# Patient Record
Sex: Male | Born: 1945 | Race: White | Hispanic: No | Marital: Single
Health system: Southern US, Community
[De-identification: ages and names within clinical notes are randomized; demographics above are authoritative.]

## PROBLEM LIST (undated history)

## (undated) DIAGNOSIS — N183 Chronic kidney disease, stage 3 unspecified: Secondary | ICD-10-CM

## (undated) DIAGNOSIS — M2550 Pain in unspecified joint: Secondary | ICD-10-CM

## (undated) DIAGNOSIS — Z8719 Personal history of other diseases of the digestive system: Secondary | ICD-10-CM

## (undated) DIAGNOSIS — I4891 Unspecified atrial fibrillation: Secondary | ICD-10-CM

## (undated) DIAGNOSIS — K219 Gastro-esophageal reflux disease without esophagitis: Secondary | ICD-10-CM

## (undated) DIAGNOSIS — I2510 Atherosclerotic heart disease of native coronary artery without angina pectoris: Secondary | ICD-10-CM

## (undated) DIAGNOSIS — I351 Nonrheumatic aortic (valve) insufficiency: Secondary | ICD-10-CM

## (undated) DIAGNOSIS — I5020 Unspecified systolic (congestive) heart failure: Secondary | ICD-10-CM

## (undated) DIAGNOSIS — I061 Rheumatic aortic insufficiency: Secondary | ICD-10-CM

## (undated) DIAGNOSIS — I10 Essential (primary) hypertension: Secondary | ICD-10-CM

## (undated) DIAGNOSIS — I50812 Chronic right heart failure: Secondary | ICD-10-CM

## (undated) DIAGNOSIS — Z87898 Personal history of other specified conditions: Secondary | ICD-10-CM

## (undated) DIAGNOSIS — E119 Type 2 diabetes mellitus without complications: Secondary | ICD-10-CM

## (undated) DIAGNOSIS — I255 Ischemic cardiomyopathy: Secondary | ICD-10-CM

## (undated) DIAGNOSIS — Z9289 Personal history of other medical treatment: Secondary | ICD-10-CM

## (undated) DIAGNOSIS — I482 Chronic atrial fibrillation, unspecified: Secondary | ICD-10-CM

## (undated) DIAGNOSIS — I2781 Cor pulmonale (chronic): Secondary | ICD-10-CM

## (undated) DIAGNOSIS — K921 Melena: Secondary | ICD-10-CM

## (undated) DIAGNOSIS — I509 Heart failure, unspecified: Secondary | ICD-10-CM

## (undated) HISTORY — PX: TONSILLECTOMY: SUR1361

## (undated) HISTORY — DX: Personal history of other medical treatment: Z92.89

---

## 2004-09-11 HISTORY — PX: CARDIAC CATHETERIZATION: SHX172

## 2005-08-18 ENCOUNTER — Ambulatory Visit (HOSPITAL_COMMUNITY)
Admission: RE | Admit: 2005-08-18 | Discharge: 2005-08-18 | Payer: Self-pay | Admitting: Thoracic Surgery (Cardiothoracic Vascular Surgery)

## 2005-08-21 ENCOUNTER — Encounter
Admission: RE | Admit: 2005-08-21 | Discharge: 2005-08-21 | Payer: Self-pay | Admitting: Thoracic Surgery (Cardiothoracic Vascular Surgery)

## 2005-09-06 ENCOUNTER — Ambulatory Visit (HOSPITAL_COMMUNITY): Admission: RE | Admit: 2005-09-06 | Discharge: 2005-09-06 | Payer: Self-pay | Admitting: *Deleted

## 2006-03-19 ENCOUNTER — Encounter
Admission: RE | Admit: 2006-03-19 | Discharge: 2006-03-19 | Payer: Self-pay | Admitting: Thoracic Surgery (Cardiothoracic Vascular Surgery)

## 2011-07-28 ENCOUNTER — Emergency Department (HOSPITAL_COMMUNITY)
Admission: EM | Admit: 2011-07-28 | Discharge: 2011-07-28 | Disposition: A | Discharge status: Home / Self Care | Payer: Medicare Other | Attending: Emergency Medicine | Admitting: Emergency Medicine

## 2011-07-28 ENCOUNTER — Encounter: Payer: Self-pay | Admitting: *Deleted

## 2011-07-28 DIAGNOSIS — I10 Essential (primary) hypertension: Secondary | ICD-10-CM | POA: Insufficient documentation

## 2011-07-28 DIAGNOSIS — E119 Type 2 diabetes mellitus without complications: Secondary | ICD-10-CM | POA: Insufficient documentation

## 2011-07-28 DIAGNOSIS — Z79899 Other long term (current) drug therapy: Secondary | ICD-10-CM | POA: Insufficient documentation

## 2011-07-28 DIAGNOSIS — M79606 Pain in leg, unspecified: Secondary | ICD-10-CM

## 2011-07-28 DIAGNOSIS — I4891 Unspecified atrial fibrillation: Secondary | ICD-10-CM | POA: Insufficient documentation

## 2011-07-28 DIAGNOSIS — M79609 Pain in unspecified limb: Secondary | ICD-10-CM | POA: Insufficient documentation

## 2011-07-28 DIAGNOSIS — I480 Paroxysmal atrial fibrillation: Secondary | ICD-10-CM | POA: Insufficient documentation

## 2011-07-28 DIAGNOSIS — R269 Unspecified abnormalities of gait and mobility: Secondary | ICD-10-CM | POA: Insufficient documentation

## 2011-07-28 HISTORY — DX: Unspecified atrial fibrillation: I48.91

## 2011-07-28 HISTORY — DX: Essential (primary) hypertension: I10

## 2011-07-28 MED ORDER — OXYCODONE-ACETAMINOPHEN 5-325 MG PO TABS
ORAL_TABLET | ORAL | Status: AC
  Filled 2011-07-28: qty 2

## 2011-07-28 MED ORDER — OXYCODONE-ACETAMINOPHEN 5-325 MG PO TABS
2.0000 | ORAL_TABLET | Freq: Once | ORAL | Status: AC
  Administered 2011-07-28: 2 via ORAL

## 2011-07-28 MED ORDER — HYDROMORPHONE HCL PF 1 MG/ML IJ SOLN
1.0000 mg | Freq: Once | INTRAMUSCULAR | Status: AC
  Administered 2011-07-28: 1 mg via INTRAMUSCULAR

## 2011-07-28 MED ORDER — HYDROMORPHONE HCL PF 1 MG/ML IJ SOLN
INTRAMUSCULAR | Status: AC
  Filled 2011-07-28: qty 1

## 2011-07-28 MED ORDER — OXYCODONE-ACETAMINOPHEN 5-325 MG PO TABS: 1.0000 | ORAL_TABLET | Freq: Four times a day (QID) | ORAL | Status: AC | PRN

## 2011-07-28 NOTE — ED Provider Notes (Signed)
History     CSN: [id number] Arrival date & time: 07/28/2011  5:56 AM   First MD Initiated Contact with Patient 07/28/11 [phone]      Chief Complaint  Patient presents with  . Leg Pain    (Consider location/radiation/quality/duration/timing/severity/associated sxs/prior treatment) Patient is a 65 y.o. male presenting with leg pain. The history is provided by the patient.  Leg Pain  Pertinent negatives include no numbness.   patient reports bilateral leg pain that has been fairly constant since a minor fall that occurred in October of this year. He has been seen and evaluated by his regular doctor for this complaint, and has been taking tramadol for pain. The pain has been getting worse in the last week and the patient ran out of his tramadol yesterday he comes in today with severe pain. There was no new injury. The pain is described as being in the entire leg bilaterally, worse to bilateral knees. It is constant, gradually worsening, aching, and severe. The patient reports an inability to stand secondary to severe pain. There is no associated numbness or weakness or tingling. Pain is worse with weight-bearing and any movement of the legs; it has been temporarily relieved in the past with medications.  Past Medical History  Diagnosis Date  . Atrial fibrillation   . Diabetes mellitus   . Hypertension   . Asthma   . Cancer     No past surgical history on file.  No family history on file.  History  Substance Use Topics  . Smoking status: Not on file  . Smokeless tobacco: Not on file  . Alcohol Use:       Review of Systems  Constitutional: Negative for chills, fatigue and unexpected weight change.  HENT: Negative for ear pain, nosebleeds, neck pain, neck stiffness and tinnitus.   Eyes: Negative for pain and visual disturbance.  Respiratory: Negative for cough, chest tightness and shortness of breath.   Cardiovascular: Negative for chest pain, palpitations and leg swelling.    Gastrointestinal: Negative for nausea, vomiting and abdominal pain.  Genitourinary: Negative for hematuria and flank pain.  Musculoskeletal: Positive for gait problem. Negative for back pain and joint swelling.       Positive bilateral leg pain.  Neurological: Negative for dizziness, weakness, light-headedness, numbness and headaches.  Hematological: Does not bruise/bleed easily.  Psychiatric/Behavioral: Negative for behavioral problems and confusion.    Allergies  Review of patient's allergies indicates no known allergies.  Home Medications   Current Outpatient Rx  Name Route Sig Dispense Refill  . ASPIRIN EC 81 MG PO TBEC Oral Take 81 mg by mouth daily.      Marland Kitchen CARVEDILOL 12.5 MG PO TABS Oral Take 12.5 mg by mouth 2 (two) times daily with a meal.      . LISINOPRIL 10 MG PO TABS Oral Take 10 mg by mouth daily.      Marland Kitchen PRAVASTATIN SODIUM 20 MG PO TABS Oral Take 20 mg by mouth at bedtime.      . SPIRONOLACTONE 25 MG PO TABS Oral Take 25 mg by mouth daily.      . TORSEMIDE 100 MG PO TABS Oral Take 50 mg by mouth daily.        BP 107/61  Pulse 64  Temp(Src) 98.2 F (36.8 C) (Oral)  Resp 16  SpO2 98%  Physical Exam  Constitutional: He is oriented to person, place, and time. He appears well-developed and well-nourished.       Uncomfortable appearing  HENT:  Head: Normocephalic and atraumatic.  Eyes: Pupils are equal, round, and reactive to light.  Neck: Normal range of motion. Neck supple.  Cardiovascular: Normal rate and intact distal pulses.        Irregularly irregular rhythm  Pulmonary/Chest: Effort normal and breath sounds normal.  Abdominal: Soft. He exhibits no distension. There is no tenderness.  Musculoskeletal: Normal range of motion. He exhibits no edema.       There is mild tenderness to firm palpation of the leg from the mid thigh region to the ankle bilaterally. There are blastic in the braces in place to bilateral knees; they appear to be placed too tightly. There  is no specific area of bony tenderness. There is no specific tenderness to the medial joint line, lateral joint line, or over the patella to either knee. Bilateral Lachman's with firm endpoint. No increased pain with varus or valgus maneuvers of the knees. There is 4/5 strength to muscle testing with hip flexion extension abduction adduction, knee flexion extension. There is 5 out of 5 strength with bilateral plantar flexion and dorsi flexion.  Neurological: He is alert and oriented to person, place, and time. He has normal reflexes. No cranial nerve deficit.  Skin: Skin is warm and dry. No rash noted.       No ecchymosis or obvious wounds  Psychiatric: His behavior is normal.    ED Course  Procedures (including critical care time)  Labs Reviewed - No data to display No results found.   1. Leg pain       MDM  The patient presents with worsening of bilateral leg pain that has been occurring for greater than one month. He has been seen and evaluated by his primary doctor but ran out of his tramadol yesterday. There is a refill waiting for him at the pharmacy but he was in too much pain to leave the house and drive himself there to pick it up. In the department, the patient's pain has been reduced to a manageable level with 1 mg of IM Dilaudid. He did and laid in the department with assistance from nursing staff; he was a bit unsteady on his feet but this is not unusual for the patient-he typically walks with a cane. He will be discharged home with a prescription for pain medication and advised to follow up with his regular doctor.        Elwyn Reach [place], [place] 07/28/11 1014

## 2011-07-28 NOTE — ED Notes (Signed)
Pt states he has had increased leg pain since he fell back in October. States pain was so bad that he needed to come to the ER for evalauation. Pt reports running out of his tramadol and needs more. Pt uses a cane to ambulate.

## 2011-07-28 NOTE — ED Notes (Signed)
ZOX:WR60<AV> Expected date:07/28/11<BR> Expected time: 5:22 AM<BR> Means of arrival:Ambulance<BR> Comments:<BR> Leg pain, limited weight bearing

## 2011-07-28 NOTE — ED Notes (Signed)
Pt c/o ble pain, increasing pain since falling in October, pt ran out of his Tramadol and needs a refill. Pt able to MAE w/o difficulty, increase pain w/movement

## 2011-07-28 NOTE — ED Notes (Signed)
Pt upset at this time, pt is cursing and yelling about his sister and losing his belongings, pt is unable to locate his sister, has called multiple times for a ride home. Pt also upset d/t leaving his wallet and glasses on EMS truck. Pt has a hx of AF, pt's HR is currently ranging b/w 124-140 on bedside monitor. Pt denies any chest pain or sob at this time, sts "no my heart does this when I get upset."

## 2011-07-31 NOTE — ED Provider Notes (Signed)
Medical screening examination/treatment/procedure(s) were performed by non-physician practitioner and as supervising physician I was immediately available for consultation/collaboration.   Hanley Seamen, MD 07/31/11 [phone]

## 2011-11-03 ENCOUNTER — Other Ambulatory Visit: Payer: Self-pay | Admitting: Gastroenterology

## 2011-11-03 ENCOUNTER — Encounter (HOSPITAL_COMMUNITY)
Admission: RE | Disposition: A | Discharge status: Home Health | Payer: Self-pay | Source: Ambulatory Visit | Attending: Gastroenterology

## 2011-11-03 ENCOUNTER — Ambulatory Visit (HOSPITAL_COMMUNITY)
Admission: RE | Admit: 2011-11-03 | Discharge: 2011-11-03 | Disposition: A | Discharge status: Home Health | Payer: Medicare Other | Source: Ambulatory Visit | Attending: Gastroenterology | Admitting: Gastroenterology

## 2011-11-03 ENCOUNTER — Encounter (HOSPITAL_COMMUNITY): Payer: Self-pay | Admitting: *Deleted

## 2011-11-03 DIAGNOSIS — D509 Iron deficiency anemia, unspecified: Secondary | ICD-10-CM | POA: Insufficient documentation

## 2011-11-03 DIAGNOSIS — K449 Diaphragmatic hernia without obstruction or gangrene: Secondary | ICD-10-CM | POA: Insufficient documentation

## 2011-11-03 DIAGNOSIS — I129 Hypertensive chronic kidney disease with stage 1 through stage 4 chronic kidney disease, or unspecified chronic kidney disease: Secondary | ICD-10-CM | POA: Insufficient documentation

## 2011-11-03 DIAGNOSIS — K648 Other hemorrhoids: Secondary | ICD-10-CM | POA: Insufficient documentation

## 2011-11-03 DIAGNOSIS — R634 Abnormal weight loss: Secondary | ICD-10-CM | POA: Insufficient documentation

## 2011-11-03 DIAGNOSIS — K2970 Gastritis, unspecified, without bleeding: Secondary | ICD-10-CM | POA: Insufficient documentation

## 2011-11-03 DIAGNOSIS — I2510 Atherosclerotic heart disease of native coronary artery without angina pectoris: Secondary | ICD-10-CM | POA: Insufficient documentation

## 2011-11-03 DIAGNOSIS — K644 Residual hemorrhoidal skin tags: Secondary | ICD-10-CM | POA: Insufficient documentation

## 2011-11-03 DIAGNOSIS — E119 Type 2 diabetes mellitus without complications: Secondary | ICD-10-CM | POA: Insufficient documentation

## 2011-11-03 DIAGNOSIS — D371 Neoplasm of uncertain behavior of stomach: Secondary | ICD-10-CM | POA: Insufficient documentation

## 2011-11-03 DIAGNOSIS — N183 Chronic kidney disease, stage 3 unspecified: Secondary | ICD-10-CM | POA: Insufficient documentation

## 2011-11-03 DIAGNOSIS — R630 Anorexia: Secondary | ICD-10-CM | POA: Insufficient documentation

## 2011-11-03 DIAGNOSIS — Z79899 Other long term (current) drug therapy: Secondary | ICD-10-CM | POA: Insufficient documentation

## 2011-11-03 HISTORY — DX: Rheumatic aortic insufficiency: I06.1

## 2011-11-03 HISTORY — DX: Chronic kidney disease, stage 3 unspecified: N18.30

## 2011-11-03 HISTORY — DX: Pain in unspecified joint: M25.50

## 2011-11-03 HISTORY — DX: Atherosclerotic heart disease of native coronary artery without angina pectoris: I25.10

## 2011-11-03 HISTORY — DX: Chronic kidney disease, stage 3 (moderate): N18.3

## 2011-11-03 HISTORY — PX: COLONOSCOPY: SHX5424

## 2011-11-03 HISTORY — PX: ESOPHAGOGASTRODUODENOSCOPY: SHX5428

## 2011-11-03 HISTORY — DX: Heart failure, unspecified: I50.9

## 2011-11-03 SURGERY — EGD (ESOPHAGOGASTRODUODENOSCOPY)
Anesthesia: Moderate Sedation

## 2011-11-03 MED ORDER — FENTANYL CITRATE 0.05 MG/ML IJ SOLN
INTRAMUSCULAR | Status: AC
  Filled 2011-11-03: qty 4

## 2011-11-03 MED ORDER — MIDAZOLAM HCL 10 MG/2ML IJ SOLN
INTRAMUSCULAR | Status: DC | PRN
  Administered 2011-11-03 (×3): 1 mg via INTRAVENOUS
  Administered 2011-11-03 (×2): 2 mg via INTRAVENOUS
  Administered 2011-11-03: 1 mg via INTRAVENOUS

## 2011-11-03 MED ORDER — FENTANYL NICU IV SYRINGE 50 MCG/ML
INJECTION | INTRAMUSCULAR | Status: DC | PRN
  Administered 2011-11-03 (×2): 12.5 ug via INTRAVENOUS
  Administered 2011-11-03: 25 ug via INTRAVENOUS
  Administered 2011-11-03 (×2): 12.5 ug via INTRAVENOUS

## 2011-11-03 MED ORDER — DIPHENHYDRAMINE HCL 50 MG/ML IJ SOLN
INTRAMUSCULAR | Status: AC
  Filled 2011-11-03: qty 1

## 2011-11-03 MED ORDER — MIDAZOLAM HCL 10 MG/2ML IJ SOLN
INTRAMUSCULAR | Status: AC
  Filled 2011-11-03: qty 4

## 2011-11-03 NOTE — Interval H&P Note (Signed)
History and Physical Interval Note:  11/03/2011 8:47 AM  Steve Mccarty  has presented today for surgery, with the diagnosis of IDA  The various methods of treatment have been discussed with the patient and family. After consideration of risks, benefits and other options for treatment, the patient has consented to  Procedure(s) (LRB): ESOPHAGOGASTRODUODENOSCOPY (EGD) (N/A) COLONOSCOPY (N/A) as a surgical intervention .  The patients' history has been reviewed, patient examined, no change in status, stable for surgery.  I have reviewed the patients' chart and labs.  Questions were answered to the patient's satisfaction.     [name]

## 2011-11-03 NOTE — H&P (Signed)
  Reason for Consult: Weight loss Referring Physician: Thayer Headings, M.D.  Alver Sorrow HPI: Since the early part of the year the patient reports losing 60 lbs.  He was previously 230 lbs and now he is at 170 lbs.  With routine work up he was noted to have an anemia in the 9 range.  He denies any issues with hematochezia, melena, diarrhea, constipation, or abdominal pain.  The patient denies having a prior colonoscopy in the past.  There is no known family history of colon cancer.  Overall he reports having a lower appetite.  Past Medical History  Diagnosis Date  . Atrial fibrillation   . Diabetes mellitus   . Hypertension   . Atrial fibrillation   . Coronary artery disease   . CHF (congestive heart failure)     perpheral edema  . Chronic renal disease, stage III   . Joint pain   . Aortic valve insufficiency, rheumatic     Past Surgical History  Procedure Date  . Tonsillectomy   . Cardiac catheterization     History reviewed. No pertinent family history.  Social History:  does not have a smoking history on file. He does not have any smokeless tobacco history on file. He reports that he drinks alcohol. He reports that he does not use illicit drugs.  Allergies: No Known Allergies  Medications: Scheduled:   Continuous:    No results found for this or any previous visit (from the past 24 hour(s)).   No results found.  ROS:  As stated above in the HPI otherwise negative.  Blood pressure 95/61, pulse 106, temperature 97.8 F (36.6 C), temperature source Oral, resp. rate 17, height 5' 7.5" (1.715 m), weight 81.647 kg (180 lb), SpO2 95.00%.    PE: Gen: NAD, Alert and Oriented HEENT:  /AT, EOMI Neck: Supple, no LAD Lungs: CTA Bilaterally CV: RRR without M/G/R ABM: Soft, NTND, +BS Ext: No C/C/E  Assessment/Plan: 1) Heme positive stool 2) Iron deficiency anemia 3) Abnormal weight loss 4) Anorexia  Plan: 1) EGD/Colonoscopy for further  evaluation.   Jakaylah Schlafer D 11/03/2011, 8:41 AM

## 2011-11-03 NOTE — Op Note (Signed)
[facility] [address] [place], [place]  [address]  OPERATIVE PROCEDURE REPORT  PATIENT:  Steve Mccarty, Steve Mccarty  MR#:  [id number] BIRTHDATE:  06-Jun-1946  GENDER:  male ENDOSCOPIST:  Jeani Hawking, MD ASSISTANT:  Ara Kussmaul, Technician PROCEDURE DATE:  11/03/2011 PROCEDURE:  Colonoscopy with snare polypectomy ASA CLASS:  Class III INDICATIONS:  Anemia and Weight loss MEDICATIONS:  Fentanyl 25 mcg IV, Versed 3 mg IV  DESCRIPTION OF PROCEDURE:   After the risks benefits and alternatives of the procedure were thoroughly explained, informed consent was obtained.  Digital rectal exam was performed and revealed no abnormalities.   The Pentax Ped Colon [id number] endoscope was introduced through the anus and advanced to the cecum, which was identified by both the appendix and ileocecal valve, without limitations.  The quality of the prep was excellent..  The instrument was then slowly withdrawn as the colon was fully examined. <<PROCEDUREIMAGES>> FINDINGS:  A 2 mm sessile rectal polyp was removed with a cold snare. Scattered diverticula were noted in the left side of the colon. No other abnormalities identified.   Retroflexed views in the rectum revealed internal and external hemorrhoids.    The scope was then withdrawn from the patient and the procedure terminated.  COMPLICATIONS:  None  IMPRESSION:  1) Sessile polyp 2) Internal and external hemorrhoids RECOMMENDATIONS:  1) Await biopsy results. 2) Repeat colonoscopy in 5-10 years. 3) Start on a PPI. 4) Follow up in the office in 3 months.  ______________________________ Jeani Hawking, MD  CC:  Thayer Headings, M.D.  n. eSIGNED:   Jeani Hawking at 11/03/2011 09:26 AM  Theora Master, [phone]

## 2011-11-03 NOTE — Op Note (Signed)
[facility] [address] [place], [place]  [address]  OPERATIVE PROCEDURE REPORT  PATIENT:  Steve Mccarty, Steve Mccarty  MR#:  [id number] BIRTHDATE:  May 26, 1946  GENDER:  male ENDOSCOPIST:  Jeani Hawking, MD ASSISTANT:  Ara Kussmaul, Technician PROCEDURE DATE:  11/03/2011 PROCEDURE:  EGD with biopsy, [address] ASA CLASS:  Class III INDICATIONS:  Anemia and Weight loss MEDICATIONS:  Fentanyl 50 mcg IV, Versed 6 mg IV  DESCRIPTION OF PROCEDURE:   After the risks benefits and alternatives of the procedure were thoroughly explained, informed consent was obtained.  The Pentax Gastroscope [id number] endoscope was introduced through the mouth and advanced to the second portion of the duodenum, without limitations.  The instrument was slowly withdrawn as the mucosa was fully examined. <<PROCEDUREIMAGES>>  FINDINGS:  Gastritis was identified in the antum and multiple cold biopsies were obtained. A few erosions were found in this area. A small hiatal hernia was identified. No other abnormalities noted. Retroflexed views revealed no abnormalities.    The scope was then withdrawn from the patient and the procedure terminated.  COMPLICATIONS:  None  IMPRESSION:  1) Moderate gastritis 2) Small hiatal hernia RECOMMENDATIONS:  1) Await biopsy results 2) Proceed with the colonoscopy.  ______________________________ Jeani Hawking, MD  CC:  Thayer Headings, M.D.  n. eSIGNED:   Jeani Hawking at 11/03/2011 09:04 AM  Theora Master, [phone]

## 2011-11-03 NOTE — Discharge Instructions (Addendum)
Endoscopy Care After Please read the instructions outlined below and refer to this sheet in the next few weeks. These discharge instructions provide you with general information on caring for yourself after you leave the hospital. Your doctor may also give you specific instructions. While your treatment has been planned according to the most current medical practices available, unavoidable complications occasionally occur. If you have any problems or questions after discharge, please call your doctor. HOME CARE INSTRUCTIONS Activity  You may resume your regular activity but move at a slower pace for the next 24 hours.   Take frequent rest periods for the next 24 hours.   Walking will help expel (get rid of) the air and reduce the bloated feeling in your abdomen.   No driving for 24 hours (because of the anesthesia (medicine) used during the test).   You may shower.   Do not sign any important legal documents or operate any machinery for 24 hours (because of the anesthesia used during the test).  Nutrition  Drink plenty of fluids.   You may resume your normal diet.   Begin with a light meal and progress to your normal diet.   Avoid alcoholic beverages for 24 hours or as instructed by your caregiver.  Medications You may resume your normal medications unless your caregiver tells you otherwise. What you can expect today  You may experience abdominal discomfort such as a feeling of fullness or "gas" pains.   You may experience a sore throat for 2 to 3 days. This is normal. Gargling with salt water may help this.  Follow-up Your doctor will discuss the results of your test with you. SEEK IMMEDIATE MEDICAL CARE IF:  You have excessive nausea (feeling sick to your stomach) and/or vomiting.   You have severe abdominal pain and distention (swelling).   You have trouble swallowing.   You have a temperature over 100 F (37.8 C).   You have rectal bleeding or vomiting of blood.    Document Released: 04/11/2004 Document Revised: 05/10/2011 Document Reviewed: 10/23/2007 [facility] Patient Information 2012 [place], [place].  Colonoscopy Care After These instructions give you information on caring for yourself after your procedure. Your doctor may also give you more specific instructions. Call your doctor if you have any problems or questions after your procedure. HOME CARE  Take it easy for the next 24 hours.   Rest.   Walk or use warm packs on your belly (abdomen) if you have belly cramping or gas.   Do not drive for 24 hours.   You may shower.   Do not sign important papers or use machinery for 24 hours.   Drink enough fluids to keep your pee (urine) clear or pale yellow.   Resume your normal diet. Avoid heavy or fried foods.   Avoid alcohol.   Continue taking your normal medicines.   Only take medicine as told by your doctor. Do not take aspirin.  If you had growths (polyps) removed:  Do not take aspirin.   Do not drink alcohol for 7 days or as told by your doctor.   Eat a soft diet for 24 hours.  GET HELP RIGHT AWAY IF:  You have a fever.   You pass clumps of tissue (blood clots) or fill the toilet with blood.   You have belly pain that gets worse and medicine does not help.   Your belly is puffy (swollen).   You feel sick to your stomach (nauseous) or throw up (vomit).  MAKE  SURE YOU:  Understand these instructions.   Will watch your condition.   Will get help right away if you are not doing well or get worse.  Document Released: 09/30/2010 Document Revised: 05/10/2011 Document Reviewed: 09/30/2010 [facility] Patient Information 2012 [place], [place].

## 2011-11-06 ENCOUNTER — Encounter (HOSPITAL_COMMUNITY): Payer: Self-pay | Admitting: Gastroenterology

## 2011-11-28 DIAGNOSIS — Z9289 Personal history of other medical treatment: Secondary | ICD-10-CM

## 2011-11-28 HISTORY — DX: Personal history of other medical treatment: Z92.89

## 2012-02-28 ENCOUNTER — Other Ambulatory Visit: Payer: Self-pay | Admitting: Internal Medicine

## 2012-02-28 DIAGNOSIS — R188 Other ascites: Secondary | ICD-10-CM

## 2012-02-29 ENCOUNTER — Ambulatory Visit
Admission: RE | Admit: 2012-02-29 | Discharge: 2012-02-29 | Disposition: A | Discharge status: Home / Self Care | Payer: Medicare Other | Source: Ambulatory Visit | Attending: Internal Medicine | Admitting: Internal Medicine

## 2012-02-29 DIAGNOSIS — R188 Other ascites: Secondary | ICD-10-CM

## 2012-03-20 ENCOUNTER — Emergency Department (HOSPITAL_COMMUNITY): Payer: Medicare Other

## 2012-03-20 ENCOUNTER — Encounter (HOSPITAL_COMMUNITY): Payer: Self-pay | Admitting: *Deleted

## 2012-03-20 ENCOUNTER — Inpatient Hospital Stay (HOSPITAL_COMMUNITY)
Admission: EM | Admit: 2012-03-20 | Discharge: 2012-03-22 | DRG: 378 | Disposition: A | Discharge status: Home / Self Care | Payer: Medicare Other | Attending: Internal Medicine | Admitting: Internal Medicine

## 2012-03-20 DIAGNOSIS — K5731 Diverticulosis of large intestine without perforation or abscess with bleeding: Principal | ICD-10-CM | POA: Diagnosis present

## 2012-03-20 DIAGNOSIS — I4891 Unspecified atrial fibrillation: Secondary | ICD-10-CM

## 2012-03-20 DIAGNOSIS — I129 Hypertensive chronic kidney disease with stage 1 through stage 4 chronic kidney disease, or unspecified chronic kidney disease: Secondary | ICD-10-CM | POA: Diagnosis present

## 2012-03-20 DIAGNOSIS — I959 Hypotension, unspecified: Secondary | ICD-10-CM | POA: Diagnosis present

## 2012-03-20 DIAGNOSIS — K922 Gastrointestinal hemorrhage, unspecified: Secondary | ICD-10-CM | POA: Diagnosis present

## 2012-03-20 DIAGNOSIS — N183 Chronic kidney disease, stage 3 unspecified: Secondary | ICD-10-CM | POA: Diagnosis present

## 2012-03-20 DIAGNOSIS — E871 Hypo-osmolality and hyponatremia: Secondary | ICD-10-CM | POA: Diagnosis present

## 2012-03-20 DIAGNOSIS — D62 Acute posthemorrhagic anemia: Secondary | ICD-10-CM | POA: Diagnosis present

## 2012-03-20 DIAGNOSIS — N179 Acute kidney failure, unspecified: Secondary | ICD-10-CM | POA: Diagnosis present

## 2012-03-20 DIAGNOSIS — E118 Type 2 diabetes mellitus with unspecified complications: Secondary | ICD-10-CM | POA: Diagnosis present

## 2012-03-20 DIAGNOSIS — D72829 Elevated white blood cell count, unspecified: Secondary | ICD-10-CM

## 2012-03-20 LAB — COMPREHENSIVE METABOLIC PANEL
CO2: 20 mEq/L (ref 19–32)
Calcium: 10 mg/dL (ref 8.4–10.5)
Creatinine, Ser: 2.9 mg/dL — ABNORMAL HIGH (ref 0.50–1.35)
GFR calc Af Amer: 24 mL/min — ABNORMAL LOW (ref >90)
GFR calc non Af Amer: 21 mL/min — ABNORMAL LOW (ref >90)
Glucose, Bld: 192 mg/dL — ABNORMAL HIGH (ref 70–99)

## 2012-03-20 LAB — CBC
Hemoglobin: 6.7 g/dL — CL (ref 13.0–17.0)
MCH: 27 pg (ref 26.0–34.0)
MCH: 27.4 pg (ref 26.0–34.0)
MCHC: 35 g/dL (ref 30.0–36.0)
MCV: 78.2 fL (ref 78.0–100.0)
MCV: 78.3 fL (ref 78.0–100.0)
Platelets: 187 10*3/uL (ref 150–400)
RBC: 2.48 MIL/uL — ABNORMAL LOW (ref 4.22–5.81)
RDW: 16.3 % — ABNORMAL HIGH (ref 11.5–15.5)

## 2012-03-20 LAB — GLUCOSE, CAPILLARY

## 2012-03-20 LAB — MAGNESIUM: Magnesium: 3.5 mg/dL — ABNORMAL HIGH (ref 1.5–2.5)

## 2012-03-20 LAB — OCCULT BLOOD, POC DEVICE: Fecal Occult Bld: POSITIVE

## 2012-03-20 LAB — PHOSPHORUS: Phosphorus: 4.1 mg/dL (ref 2.3–4.6)

## 2012-03-20 MED ORDER — ONDANSETRON HCL 4 MG/2ML IJ SOLN: 4.0000 mg | Freq: Four times a day (QID) | INTRAMUSCULAR | Status: DC | PRN

## 2012-03-20 MED ORDER — LISINOPRIL 10 MG PO TABS: 10.0000 mg | ORAL_TABLET | Freq: Every day | ORAL | Status: DC

## 2012-03-20 MED ORDER — SODIUM CHLORIDE 0.9 % IJ SOLN
3.0000 mL | Freq: Two times a day (BID) | INTRAMUSCULAR | Status: DC
  Administered 2012-03-20 – 2012-03-22 (×4): 3 mL via INTRAVENOUS

## 2012-03-20 MED ORDER — SODIUM CHLORIDE 0.9 % IV SOLN
8.0000 mg/h | INTRAVENOUS | Status: DC
  Administered 2012-03-20 – 2012-03-22 (×4): 8 mg/h via INTRAVENOUS
  Filled 2012-03-20 (×10): qty 80

## 2012-03-20 MED ORDER — SIMVASTATIN 10 MG PO TABS
10.0000 mg | ORAL_TABLET | Freq: Every day | ORAL | Status: DC
  Administered 2012-03-20 – 2012-03-21 (×2): 10 mg via ORAL
  Filled 2012-03-20 (×3): qty 1

## 2012-03-20 MED ORDER — SPIRONOLACTONE 25 MG PO TABS: 25.0000 mg | ORAL_TABLET | Freq: Every day | ORAL | Status: DC

## 2012-03-20 MED ORDER — CARVEDILOL 12.5 MG PO TABS
12.5000 mg | ORAL_TABLET | Freq: Two times a day (BID) | ORAL | Status: DC
  Filled 2012-03-20 (×6): qty 1

## 2012-03-20 MED ORDER — HYDROCODONE-ACETAMINOPHEN 5-325 MG PO TABS
1.0000 | ORAL_TABLET | ORAL | Status: DC | PRN
  Administered 2012-03-21: 2 via ORAL
  Administered 2012-03-21: 1 via ORAL
  Filled 2012-03-20: qty 2
  Filled 2012-03-20: qty 1

## 2012-03-20 MED ORDER — SODIUM CHLORIDE 0.9 % IV SOLN
80.0000 mg | Freq: Once | INTRAVENOUS | Status: AC
  Administered 2012-03-20: 80 mg via INTRAVENOUS
  Filled 2012-03-20: qty 80

## 2012-03-20 MED ORDER — INSULIN ASPART 100 UNIT/ML ~~LOC~~ SOLN: 0.0000 [IU] | Freq: Three times a day (TID) | SUBCUTANEOUS | Status: DC

## 2012-03-20 MED ORDER — ONDANSETRON HCL 4 MG PO TABS: 4.0000 mg | ORAL_TABLET | Freq: Four times a day (QID) | ORAL | Status: DC | PRN

## 2012-03-20 MED ORDER — SODIUM CHLORIDE 0.9 % IV SOLN
Freq: Once | INTRAVENOUS | Status: AC
  Administered 2012-03-20: 12:00:00 via INTRAVENOUS

## 2012-03-20 MED ORDER — TORSEMIDE 20 MG PO TABS: 50.0000 mg | ORAL_TABLET | Freq: Every day | ORAL | Status: DC

## 2012-03-20 MED ORDER — INSULIN ASPART 100 UNIT/ML ~~LOC~~ SOLN
0.0000 [IU] | Freq: Three times a day (TID) | SUBCUTANEOUS | Status: DC
  Administered 2012-03-20: 3 [IU] via SUBCUTANEOUS
  Administered 2012-03-21: 2 [IU] via SUBCUTANEOUS
  Administered 2012-03-21 – 2012-03-22 (×3): 1 [IU] via SUBCUTANEOUS

## 2012-03-20 MED ORDER — INSULIN ASPART 100 UNIT/ML ~~LOC~~ SOLN: 0.0000 [IU] | SUBCUTANEOUS | Status: DC

## 2012-03-20 NOTE — ED Provider Notes (Signed)
History     CSN: [id number]  Arrival date & time 03/20/12  1059   First MD Initiated Contact with Patient 03/20/12 1131      No chief complaint on file.   HPI The patient presents with complaints of fatigue and rectal bleeding.  He notes over the past 2 weeks his symptoms began gradually then progressed to near incapacitating fatigue and inability to perform activities of daily living.  He notes symptoms are worse with any amount of motion, minimally better at rest.  He denies similar prior episodes of bleeding. The patient recently initiated as a Xarelto do to his history of atrial fibrillation and aortic valve insufficiency. He denies any ongoing syncope, confusion, disorientation, chest pain, dyspnea, new lower extremity edema. Past Medical History  Diagnosis Date  . Atrial fibrillation   . Diabetes mellitus   . Hypertension   . Atrial fibrillation   . Coronary artery disease   . CHF (congestive heart failure)     perpheral edema  . Chronic renal disease, stage III   . Joint pain   . Aortic valve insufficiency, rheumatic     Past Surgical History  Procedure Date  . Tonsillectomy   . Cardiac catheterization   . Esophagogastroduodenoscopy 11/03/2011    Procedure: ESOPHAGOGASTRODUODENOSCOPY (EGD);  Surgeon: Theda Belfast, MD;  Location: [facility];  Service: Endoscopy;  Laterality: N/A;  . Colonoscopy 11/03/2011    Procedure: COLONOSCOPY;  Surgeon: Theda Belfast, MD;  Location: [facility];  Service: Endoscopy;  Laterality: N/A;    History reviewed. No pertinent family history.  History  Substance Use Topics  . Smoking status: Never Smoker   . Smokeless tobacco: Never Used  . Alcohol Use: Yes     rarely      Review of Systems  Constitutional:       Per HPI, otherwise negative  HENT:       Per HPI, otherwise negative  Eyes: Negative.   Respiratory:       Per HPI, otherwise negative  Cardiovascular:       Per HPI, otherwise negative  Gastrointestinal:  Negative for vomiting.  Genitourinary: Negative.   Musculoskeletal:       Per HPI, otherwise negative  Skin: Negative.   Neurological: Negative for syncope.    Allergies  Review of patient's allergies indicates no known allergies.  Home Medications   Current Outpatient Rx  Name Route Sig Dispense Refill  . ASPIRIN EC 81 MG PO TBEC Oral Take 81 mg by mouth daily.      Marland Kitchen CARVEDILOL 12.5 MG PO TABS Oral Take 12.5 mg by mouth 2 (two) times daily with a meal.      . LISINOPRIL 10 MG PO TABS Oral Take 10 mg by mouth daily.      Marland Kitchen PRAVASTATIN SODIUM 20 MG PO TABS Oral Take 20 mg by mouth at bedtime.      . SPIRONOLACTONE 25 MG PO TABS Oral Take 25 mg by mouth daily.      . TORSEMIDE 100 MG PO TABS Oral Take 50 mg by mouth daily.        BP 103/73  Pulse 110  Temp 97.1 F (36.2 C) (Rectal)  Resp 19  SpO2 100%  Physical Exam  Nursing note and vitals reviewed. Constitutional: He is oriented to person, place, and time.       Pale, elderly appearing male resting in bed  HENT:  Head: Normocephalic and atraumatic.  Eyes:  Pupils are equal round and reactive, conjunctiva isn't pale  Cardiovascular: Normal rate.   Murmur heard. Pulmonary/Chest: Effort normal. No respiratory distress.  Abdominal: Soft. He exhibits no distension.  Genitourinary: Guaiac positive stool.       Patient's stool is both grossly and Hemoccult-positive  Neurological: He is alert and oriented to person, place, and time.  Skin: Skin is dry. There is pallor.  Psychiatric: He has a normal mood and affect.    ED Course  Procedures (including critical care time)  Labs Reviewed  PROTIME-INR - Abnormal; Notable for the following:    Prothrombin Time 26.4 (*)     INR 2.38 (*)     All other components within normal limits  CBC - Abnormal; Notable for the following:    WBC 11.1 (*)     RBC 2.48 (*)     Hemoglobin 6.7 (*)     HCT 19.4 (*)     RDW 16.8 (*)     All other components within normal limits    COMPREHENSIVE METABOLIC PANEL - Abnormal; Notable for the following:    Sodium 127 (*)     Chloride 87 (*)     Glucose, Bld 192 (*)     BUN 162 (*)     Creatinine, Ser 2.90 (*)     GFR calc non Af Amer 21 (*)     GFR calc Af Amer 24 (*)     All other components within normal limits  TYPE AND SCREEN  OCCULT BLOOD, POC DEVICE  ABO/RH  PREPARE RBC (CROSSMATCH)   Dg Chest Port 1 View  03/20/2012  *RADIOLOGY REPORT*  Clinical Data: GI bleed.  PORTABLE CHEST - 1 VIEW  Comparison: PA and lateral chest 07/18/2011.  Findings: Tiny right pleural effusion is noted.  No left pleural effusion is seen.  There is cardiomegaly but no pulmonary edema. The lungs are clear.  IMPRESSION: Tiny right pleural effusion and cardiomegaly without edema.  Original Report Authenticated By: Bernadene Bell. Maricela Curet, M.D.     No diagnosis found.  Cardiac 110st, abnormal  Pulse ox 99%ra, normal   Date: 03/21/2012  Rate: 11  Rhythm: AFIB  QRS Axis: LAD  Intervals: afib  ST/T Wave abnormalities: nonspecific st / t changes  Conduction Disutrbances:NIVCD  Narrative Interpretation:   Old EKG Reviewed: Not available ABNORMAL   On return of initial labs, I spoke w GI, and transfusion was initiated  MDM  This elderly appearing M p/w weeks of fatigue and rectal bleeding.  On exam he is pale, w pale conjuctiva.  The patient is, however, in no distress.  Labs notable for anemia, and given his active bleeding, I discussed his case w GI, prepared him for transfusion, and he was admitted for further E/M.       Gerhard Munch, MD 03/21/12 0004

## 2012-03-20 NOTE — ED Notes (Signed)
ZOX:WR60<AV> Expected date:03/20/12<BR> Expected time:10:50 AM<BR> Means of arrival:Ambulance<BR> Comments:<BR> Abdominal pain with dark stool

## 2012-03-20 NOTE — ED Notes (Signed)
Per EMS pt from home, lives by self, started taking xarelto 2 weeks ago, that's when he started noticing dark tarry stools, has been trying to get in touch w/ his PCP Dr. Ronne Binning but has not ben about to until today and was told he needed to come to the ER. Pt pale. BP 90/60, HR 100, RR 18.

## 2012-03-20 NOTE — H&P (Signed)
[facility] History and Physical  Steve Mccarty WJX:[id number] DOB: 29-Jul-1946 DOA: 03/20/2012  Referring physician: ED physician PCP: No primary provider on file.   Chief Complaint: rectal bleeding  HPI:  Pt is 66 yo male who presents to [facility] with main concern of rectal bleed that he initially noted 1-2 days prior to admission and has been associated with persistent fatigue that he first noticed 2 weeks prior to admission. He explains that his symptoms were so bothersome that he was not able to carry the activities of daily living. He also experience shortness of breath especially with exertion, generalized weakness. He denies chest pain, fevers, chills, or similar episodes in the past. No recent sicknesses, no sick contacts, no traumas. He reports being on Xeralto. He denies syncopal events, no dizziness, no headaches, no specific focal neurologic weakness.   Review of Systems:   Constitutional: Negative for fever, chills. Negative for diaphoresis.  HENT: Negative for hearing loss, ear pain, nosebleeds, congestion, sore throat, neck pain, tinnitus and ear discharge.   Eyes: Negative for blurred vision, double vision, photophobia, pain, discharge and redness.  Respiratory: Negative for cough, hemoptysis, sputum production, shortness of breath, wheezing and stridor.   Cardiovascular: Negative for chest pain, palpitations, orthopnea, claudication and leg swelling.  Gastrointestinal: Negative for nausea, vomiting and abdominal pain. Negative for heartburn, constipation. Positive for blood in stool. Genitourinary: Negative for dysuria, urgency, frequency, hematuria and flank pain.  Musculoskeletal: Negative for myalgias, back pain, joint pain and falls.  Skin: Negative for itching and rash.  Neurological: Negative for dizziness and weakness. Negative for tingling, tremors, sensory change, speech change, focal weakness, loss of consciousness and headaches.  Endo/Heme/Allergies: Negative  for environmental allergies and polydipsia. Does not bruise/bleed easily.  Psychiatric/Behavioral: Negative for suicidal ideas. The patient is not nervous/anxious.      Past Medical History  Diagnosis Date  . Atrial fibrillation   . Diabetes mellitus   . Hypertension   . Atrial fibrillation   . Coronary artery disease   . CHF (congestive heart failure)     perpheral edema  . Chronic renal disease, stage III   . Joint pain   . Aortic valve insufficiency, rheumatic    Past Surgical History  Procedure Date  . Tonsillectomy   . Cardiac catheterization   . Esophagogastroduodenoscopy 11/03/2011    Procedure: ESOPHAGOGASTRODUODENOSCOPY (EGD);  Surgeon: Theda Belfast, MD;  Location: [facility];  Service: Endoscopy;  Laterality: N/A;  . Colonoscopy 11/03/2011    Procedure: COLONOSCOPY;  Surgeon: Theda Belfast, MD;  Location: [facility];  Service: Endoscopy;  Laterality: N/A;   Social History:  reports that he has never smoked. He has never used smokeless tobacco. He reports that he drinks alcohol. He reports that he does not use illicit drugs.  No Known Allergies  History reviewed. No pertinent family history.  Prior to Admission medications   Medication Sig Start Date End Date Taking? Authorizing Provider  aspirin EC 81 MG tablet Take 81 mg by mouth daily.      Historical Provider, MD  carvedilol (COREG) 12.5 MG tablet Take 12.5 mg by mouth 2 (two) times daily with a meal.      Historical Provider, MD  lisinopril (PRINIVIL,ZESTRIL) 10 MG tablet Take 10 mg by mouth daily.      Historical Provider, MD  pravastatin (PRAVACHOL) 20 MG tablet Take 20 mg by mouth at bedtime.      Historical Provider, MD  spironolactone (ALDACTONE) 25 MG tablet Take 25 mg  by mouth daily.      Historical Provider, MD  torsemide (DEMADEX) 100 MG tablet Take 50 mg by mouth daily.      Historical Provider, MD   Physical Exam: Filed Vitals:   03/20/12 1117 03/20/12 1230  BP: 102/51 103/73  Pulse: 112 110    Temp: 97.1 F (36.2 C)   TempSrc: Rectal   Resp: 17 19  SpO2: 100% 100%    Physical Exam  Constitutional: Appears tired and pale. No distress.  HENT: Normocephalic. External right and left ear normal. Oropharynx is clear and moist.  Eyes: EOM are normal. PERRLA, no scleral icterus. Conjunctival pallor.  Neck: Normal ROM. Neck supple. No JVD. No tracheal deviation. No thyromegaly.  CVS: Irregular rate and rhythm, no murmurs, no gallops, no carotid bruit.  Pulmonary: Effort and breath sounds normal, no stridor, rhonchi, wheezes, rales.  Abdominal: Soft. BS +,  no distension, tenderness, rebound or guarding.  Musculoskeletal: Normal range of motion. No edema and no tenderness.  Lymphadenopathy: No lymphadenopathy noted, cervical, inguinal. Neuro: Alert. Normal reflexes, muscle tone coordination. No cranial nerve deficit. Skin: Skin is warm and dry. No rash noted. Not diaphoretic. No erythema. Pallor.  Psychiatric: Normal mood and affect. Behavior, judgment, thought content normal.   Labs on Admission:  Basic Metabolic Panel:  Lab 03/20/12 9147  NA 127*  K 4.2  CL 87*  CO2 20  GLUCOSE 192*  BUN 162*  CREATININE 2.90*  CALCIUM 10.0  MG --  PHOS --   Liver Function Tests:  Lab 03/20/12 1200  AST 19  ALT 10  ALKPHOS 65  BILITOT 1.1  PROT 7.8  ALBUMIN 4.2   CBC:  Lab 03/20/12 1200  WBC 11.1*  NEUTROABS --  HGB 6.7*  HCT 19.4*  MCV 78.2  PLT 249   Radiological Exams on Admission: Dg Chest Port 1 View  03/20/2012  *RADIOLOGY REPORT*  Clinical Data: GI bleed.  PORTABLE CHEST - 1 VIEW  Comparison: PA and lateral chest 07/18/2011.  Findings: Tiny right pleural effusion is noted.  No left pleural effusion is seen.  There is cardiomegaly but no pulmonary edema. The lungs are clear.  IMPRESSION: Tiny right pleural effusion and cardiomegaly without edema.  Original Report Authenticated By: Bernadene Bell. D'ALESSIO, M.D.   EKG: Normal sinus rhythm, tachycardia, no ST/T wave  changes  Assessment/Plan  Principal Problem:  *GI bleed - unclear etiology at this time - GI consulted - will follow upon recommendations - CBC repeat and CBC in AM  Active Problems:  Hypotension - pt is several home BP medication, will hold all except low dose Coreg for now - will continue to monitor vitals per floor protocol and will readjust the regimen as indicated   ARF (acute renal failure) - likely of pre renal etiology - continue IVF, hold torsemide and lisinopril - BMP in AM   Diabetes mellitus type 2 with complications - will check A1C - start SSI for now and will readjust the regimen as indicated   Hyponatremia - likely of pre renal etiology - will check BMP in AM   Leukocytosis - unclear etiology - CBC in AM  Code Status: Full Family Communication: Pt at bedside Disposition Plan: Admit to telemetry floor  Debbora Presto, MD  [facility] Pager [phone]  If 7PM-7AM, please contact night-coverage [email or web address] Password [email or web address] 03/20/2012, 1:51 PM

## 2012-03-21 DIAGNOSIS — E871 Hypo-osmolality and hyponatremia: Secondary | ICD-10-CM

## 2012-03-21 LAB — BASIC METABOLIC PANEL
Calcium: 9.8 mg/dL (ref 8.4–10.5)
Creatinine, Ser: 2.26 mg/dL — ABNORMAL HIGH (ref 0.50–1.35)
GFR calc Af Amer: 33 mL/min — ABNORMAL LOW (ref >90)

## 2012-03-21 LAB — CBC
MCV: 78.5 fL (ref 78.0–100.0)
Platelets: 170 10*3/uL (ref 150–400)
RDW: 16.4 % — ABNORMAL HIGH (ref 11.5–15.5)
WBC: 9.4 10*3/uL (ref 4.0–10.5)

## 2012-03-21 LAB — GLUCOSE, CAPILLARY
Glucose-Capillary: 155 mg/dL — ABNORMAL HIGH (ref 70–99)
Glucose-Capillary: 121 mg/dL — ABNORMAL HIGH (ref 70–99)

## 2012-03-21 LAB — PREPARE RBC (CROSSMATCH)

## 2012-03-21 MED ORDER — SODIUM CHLORIDE 0.9 % IV SOLN
INTRAVENOUS | Status: DC
  Administered 2012-03-21 (×2): via INTRAVENOUS

## 2012-03-21 NOTE — Progress Notes (Signed)
Patient ID: Steve Mccarty, male   DOB: 20-Feb-1946, 66 y.o.   MRN: [id number]  [facility] PROGRESS NOTE  Steve Mccarty:[id number] DOB: 02/23/1946 DOA: 03/20/2012 PCP: No primary provider on file.  Brief narrative: Pt is 66 yo male who was admitted 03/20/2012 with main concern of progressively worsening fatigue and was found to have Hg 6.7 on admission. He is now s/p 3 units PRBC transfusion with Hg 7.9 today. FOBT positive. GI consulted. Please note that pt did reports weight loss over the past 6 months but unclear how much weight, he thinks ~ 40 lbs.  ASSESSMENT AND PLAN:  Principal Problem:  *GI bleed - still unclear source - GI consulted for further recommendations on management - pt is now s/t 3 units PRBC transfussion - will obtain CBC in AM - will keep NPO for now until seen by GI  Active Problems:  Hypotension - likely of pre renal etiology - will hold home blood pressure medications for now - continue to monitor vitals per floor protocol   ARF (acute renal failure) - likely of pre renal etiology - creatinine is trending down - BMP in AM   Diabetes mellitus type 2 with complications - well controlled - will continue to monitor CBG and will readjust the regimen as indicated   Hyponatremia - Na remains stable - BMP in AM   Leukocytosis - likely reactive - WBC is within normal limits this AM - CBC in AM  Consultants:  GI  Procedures:  None  Antibiotics:  None  HPI/Subjective: No events overnight. Pt reports feeling better this AM.  Objective: Filed Vitals:   03/21/12 0800 03/21/12 1055 03/21/12 1135 03/21/12 1235  BP: 93/57 93/63 90/57  93/57  Pulse: 84 110 108   Temp:  98.2 F (36.8 C) 98.1 F (36.7 C) 98.2 F (36.8 C)  TempSrc:  Oral Oral Oral  Resp:  18 18 20   SpO2:        Intake/Output Summary (Last 24 hours) at 03/21/12 1300 Last data filed at 03/21/12 1235  Gross per 24 hour  Intake 2577.5 ml  Output   2675 ml  Net   -97.5 ml    Exam:   General:  Pt is alert, follows commands appropriately, not in acute distress  Cardiovascular: Regular rhythm, tachycardic, S1/S2, no murmurs, no rubs, no gallops  Respiratory: Clear to auscultation bilaterally, no wheezing, no crackles, no rhonchi  Abdomen: Soft, non tender, non distended, bowel sounds present, no guarding  Extremities: No edema, pulses DP and PT palpable bilaterally  Neuro: Grossly nonfocal  Data Reviewed: Basic Metabolic Panel:  Lab 03/21/12 9562 03/20/12 1200  NA 126* 127*  K 3.6 4.2  CL 91* 87*  CO2 22 20  GLUCOSE 134* 192*  BUN 124* 162*  CREATININE 2.26* 2.90*  CALCIUM 9.8 10.0  MG -- 3.5*  PHOS -- 4.1   Liver Function Tests:  Lab 03/20/12 1200  AST 19  ALT 10  ALKPHOS 65  BILITOT 1.1  PROT 7.8  ALBUMIN 4.2   CBC:  Lab 03/21/12 0411 03/20/12 2314 03/20/12 1200  WBC 9.4 9.7 11.1*  NEUTROABS -- -- --  HGB 7.9* 8.2* 6.7*  HCT 22.6* 23.4* 19.4*  MCV 78.5 78.3 78.2  PLT 170 187 249   CBG:  Lab 03/21/12 1234 03/21/12 0703 03/20/12 1816  GLUCAP 155* 136* 241*    No results found for this or any previous visit (from the past 240 hour(s)).   Studies: Dg Chest Port 1  View  03/20/2012  *RADIOLOGY REPORT*  Clinical Data: GI bleed.  PORTABLE CHEST - 1 VIEW  Comparison: PA and lateral chest 07/18/2011.  Findings: Tiny right pleural effusion is noted.  No left pleural effusion is seen.  There is cardiomegaly but no pulmonary edema. The lungs are clear.  IMPRESSION: Tiny right pleural effusion and cardiomegaly without edema.  Original Report Authenticated By: Bernadene Bell. D'ALESSIO, M.D.    Scheduled Meds:   . carvedilol  12.5 mg Oral BID WC  . insulin aspart  0-9 Units Subcutaneous TID WC  . simvastatin  10 mg Oral q1800   Continuous Infusions:   . pantoprozole (PROTONIX) infusion 8 mg/hr (03/21/12 0048)     Code Status: Full Family Communication: Pt at bedside Disposition Plan: PT evaluation  Debbora Presto, MD  [facility] Pager [phone]  If 7PM-7AM, please contact night-coverage [email or web address] Password [email or web address] 03/21/2012, 1:00 PM   LOS: 1 day

## 2012-03-21 NOTE — Consult Note (Signed)
Reason for Consult: Hematochezia and Anemia Referring Physician: [facility]  Steve Mccarty HPI: This is a 66 year old patient who is admitted for complaints of fatigue and persistent hematochezia.  The patient is a very poor and unreliable historian.  From what I can discern he started to have issues with fatigue.  He reports having issues with hematochezia for the past week, but he gives a history of melena greater than 4 weeks.  He states that he was evaluated by Dr. Hilty for bleeding issues and then told to follow up with Dr. McKenzie.  I was able to obtain notes from Drs. Hilty and McKenzie.  He apparently was being managed for cardiac issues.  He was noted to have a weight gain and it was noted that this was secondary to his ascites.  He has a history of CHF, but there is also the thought that he may have cirrhosis.  A RUQ U/S was performed and it revealed a fatty liver and ascites.  I evaluated the patient on 11/03/2011 with an EGD/Colonoscopy for a mild anemia at 9.2 g/dL with findings of a nonspecific gastritis and colonic diverticulosis.  No overt evidence of bleeding.  No identification of esophageal varices or portal HTN gastropathy.  Past Medical History  Diagnosis Date  . Atrial fibrillation   . Diabetes mellitus   . Hypertension   . Atrial fibrillation   . Coronary artery disease   . CHF (congestive heart failure)     perpheral edema  . Chronic renal disease, stage III   . Joint pain   . Aortic valve insufficiency, rheumatic     Past Surgical History  Procedure Date  . Tonsillectomy   . Cardiac catheterization   . Esophagogastroduodenoscopy 11/03/2011    Procedure: ESOPHAGOGASTRODUODENOSCOPY (EGD);  Surgeon: [place] D Casey Maxfield, MD;  Location: [facility];  Service: Endoscopy;  Laterality: N/A;  . Colonoscopy 11/03/2011    Procedure: COLONOSCOPY;  Surgeon: Shannan Garfinkel D Donnika Kucher, MD;  Location: [facility];  Service: Endoscopy;  Laterality: N/A;    History reviewed. No  pertinent family history.  Social History:  reports that he has never smoked. He has never used smokeless tobacco. He reports that he drinks alcohol. He reports that he does not use illicit drugs.  Allergies: No Known Allergies  Medications:  Scheduled:   . carvedilol  12.5 mg Oral BID WC  . insulin aspart  0-9 Units Subcutaneous TID WC  . simvastatin  10 mg Oral q1800  . sodium chloride  3 mL Intravenous Q12H  . DISCONTD: insulin aspart  0-9 Units Subcutaneous Q4H  . DISCONTD: insulin aspart  0-9 Units Subcutaneous TID WC   Continuous:   . sodium chloride    . pantoprozole (PROTONIX) infusion 8 mg/hr (03/21/12 0048)    Results for orders placed during the hospital encounter of 03/20/12 (from the past 24 hour(s))  GLUCOSE, CAPILLARY     Status: Abnormal   Collection Time   03/20/12  6:16 PM      Component Value Range   Glucose-Capillary 241 (*) 70 - 99 mg/dL   Comment 1 Documented in Chart     Comment 2 Notify RN    HEMOGLOBIN A1C     Status: Abnormal   Collection Time   03/20/12 11:14 PM      Component Value Range   Hemoglobin A1C 6.2 (*) <5.7 %   Mean Plasma Glucose 131 (*) <117 mg/dL  CBC     Status: Abnormal   Collection Time     03/20/12 11:14 PM      Component Value Range   WBC 9.7  4.0 - 10.5 K/uL   RBC 2.99 (*) 4.22 - 5.81 MIL/uL   Hemoglobin 8.2 (*) 13.0 - 17.0 g/dL   HCT 23.4 (*) 39.0 - 52.0 %   MCV 78.3  78.0 - 100.0 fL   MCH 27.4  26.0 - 34.0 pg   MCHC 35.0  30.0 - 36.0 g/dL   RDW 16.3 (*) 11.5 - 15.5 %   Platelets 187  150 - 400 K/uL  BASIC METABOLIC PANEL     Status: Abnormal   Collection Time   03/21/12  4:11 AM      Component Value Range   Sodium 126 (*) 135 - 145 mEq/L   Potassium 3.6  3.5 - 5.1 mEq/L   Chloride 91 (*) 96 - 112 mEq/L   CO2 22  19 - 32 mEq/L   Glucose, Bld 134 (*) 70 - 99 mg/dL   BUN 124 (*) 6 - 23 mg/dL   Creatinine, Ser 2.26 (*) 0.50 - 1.35 mg/dL   Calcium 9.8  8.4 - 10.5 mg/dL   GFR calc non Af Amer 29 (*) >90 mL/min   GFR  calc Af Amer 33 (*) >90 mL/min  CBC     Status: Abnormal   Collection Time   03/21/12  4:11 AM      Component Value Range   WBC 9.4  4.0 - 10.5 K/uL   RBC 2.88 (*) 4.22 - 5.81 MIL/uL   Hemoglobin 7.9 (*) 13.0 - 17.0 g/dL   HCT 22.6 (*) 39.0 - 52.0 %   MCV 78.5  78.0 - 100.0 fL   MCH 27.4  26.0 - 34.0 pg   MCHC 35.0  30.0 - 36.0 g/dL   RDW 16.4 (*) 11.5 - 15.5 %   Platelets 170  150 - 400 K/uL  GLUCOSE, CAPILLARY     Status: Abnormal   Collection Time   03/21/12  7:03 AM      Component Value Range   Glucose-Capillary 136 (*) 70 - 99 mg/dL   Comment 1 Notify RN     Comment 2 Documented in Chart    PREPARE RBC (CROSSMATCH)     Status: Normal   Collection Time   03/21/12  9:00 AM      Component Value Range   Order Confirmation ORDER PROCESSED BY BLOOD BANK    GLUCOSE, CAPILLARY     Status: Abnormal   Collection Time   03/21/12 12:34 PM      Component Value Range   Glucose-Capillary 155 (*) 70 - 99 mg/dL   Comment 1 Documented in Chart     Comment 2 Notify RN       Dg Chest Port 1 View  03/20/2012  *RADIOLOGY REPORT*  Clinical Data: GI bleed.  PORTABLE CHEST - 1 VIEW  Comparison: PA and lateral chest 07/18/2011.  Findings: Tiny right pleural effusion is noted.  No left pleural effusion is seen.  There is cardiomegaly but no pulmonary edema. The lungs are clear.  IMPRESSION: Tiny right pleural effusion and cardiomegaly without edema.  Original Report Authenticated By: THOMAS L. D'ALESSIO, M.D.    ROS:  As stated above in the HPI otherwise negative.  Blood pressure 96/61, pulse 106, temperature 97.8 F (36.6 C), temperature source Oral, resp. rate 19, SpO2 98.00%.    PE: Gen: NAD, Alert and Oriented HEENT:  [place]/AT, EOMI Neck: Supple, no LAD Lungs: CTA Bilaterally CV: RRR without   M/G/R ABM: Soft, distended, +ascites, +BS Ext: No C/C/E  Assessment/Plan: 1) GI bleed. 2) Ascites - ? Cardiac versus hepatic source.   As stated above the patient is a very unreliable historian.   I think his acute bleeding episode started one week ago, but he insists on having problems with melena prior to this time.  If he did not have the melena his symptoms would be consistent with a diverticular bleed, which I believe is the real issue for his GI bleeding.  Again, the EGD and Colonoscopy were relatively unrevealing.  However, with his complaints of melena I will perform an EGD.  If it is negative, I would hold on a colonoscopy as it will be consistent with a diverticular bleed.  As for his ascites he will require a diagnostic paracentesis and a check of his SAAG with total protein.  If the SAAG is >1.1 and TP <2.5 he has portal HTN.  If the SAAG is >1.1 and TP >2.5 this is consistent with a cardiac source.  Plan: 1) EGD tomorrow. 2) Follow HGB. 3) Diagnostic paracentesis.  Camisha Srey D 03/21/2012, 2:29 PM       

## 2012-03-21 NOTE — Progress Notes (Signed)
   CARE MANAGEMENT NOTE 03/21/2012  Patient:  CHADD, TOLLISON   Account Number:  [id number]  Date Initiated:  03/21/2012  Documentation initiated by:  Jiles Crocker  Subjective/Objective Assessment:   ADMITTED WITH RECTAL BLEED     Action/Plan:   LIVES AT HOME ALONE, MOD INDEPENDENT PRIOR TO ADMISSION   Anticipated DC Date:  03/28/2012   Anticipated DC Plan:        DC Planning Services  CM consult               Status of service:  In process, will continue to follow Medicare Important Message given?  NA - LOS <3 / Initial given by admissions (If response is "NO", the following Medicare IM given date fields will be blank)  Per UR Regulation:  Reviewed for med. necessity/level of care/duration of stay  Comments:  03/21/2012- B Jordany Russett RN, BSN, MHA

## 2012-03-22 ENCOUNTER — Encounter (HOSPITAL_COMMUNITY): Payer: Self-pay

## 2012-03-22 ENCOUNTER — Encounter (HOSPITAL_COMMUNITY)
Admission: EM | Disposition: A | Discharge status: Home / Self Care | Payer: Self-pay | Source: Home / Self Care | Attending: Internal Medicine

## 2012-03-22 HISTORY — PX: ESOPHAGOGASTRODUODENOSCOPY: SHX5428

## 2012-03-22 LAB — TYPE AND SCREEN
Antibody Screen: NEGATIVE
Unit division: 0

## 2012-03-22 LAB — CBC
Platelets: 145 10*3/uL — ABNORMAL LOW (ref 150–400)
RBC: 3.14 MIL/uL — ABNORMAL LOW (ref 4.22–5.81)
WBC: 7.2 10*3/uL (ref 4.0–10.5)

## 2012-03-22 LAB — BASIC METABOLIC PANEL
CO2: 21 mEq/L (ref 19–32)
Chloride: 97 mEq/L (ref 96–112)
GFR calc Af Amer: 44 mL/min — ABNORMAL LOW (ref >90)
Sodium: 130 mEq/L — ABNORMAL LOW (ref 135–145)

## 2012-03-22 LAB — GLUCOSE, CAPILLARY
Glucose-Capillary: 125 mg/dL — ABNORMAL HIGH (ref 70–99)
Glucose-Capillary: 111 mg/dL — ABNORMAL HIGH (ref 70–99)

## 2012-03-22 SURGERY — EGD (ESOPHAGOGASTRODUODENOSCOPY)
Anesthesia: Moderate Sedation

## 2012-03-22 MED ORDER — MIDAZOLAM HCL 10 MG/2ML IJ SOLN
INTRAMUSCULAR | Status: AC
  Filled 2012-03-22: qty 4

## 2012-03-22 MED ORDER — SODIUM CHLORIDE 0.9 % IV SOLN: Freq: Once | INTRAVENOUS | Status: DC

## 2012-03-22 MED ORDER — TORSEMIDE 100 MG PO TABS: 50.0000 mg | ORAL_TABLET | Freq: Every day | ORAL | Status: DC

## 2012-03-22 MED ORDER — DIPHENHYDRAMINE HCL 50 MG/ML IJ SOLN
INTRAMUSCULAR | Status: AC
  Filled 2012-03-22: qty 1

## 2012-03-22 MED ORDER — BUTAMBEN-TETRACAINE-BENZOCAINE 2-2-14 % EX AERO
INHALATION_SPRAY | CUTANEOUS | Status: DC | PRN
  Administered 2012-03-22: 2 via TOPICAL

## 2012-03-22 MED ORDER — HYDROCODONE-ACETAMINOPHEN 5-325 MG PO TABS: 1.0000 | ORAL_TABLET | ORAL | Status: AC | PRN

## 2012-03-22 MED ORDER — DOCUSATE SODIUM 100 MG PO CAPS: 100.0000 mg | ORAL_CAPSULE | Freq: Two times a day (BID) | ORAL | Status: DC | PRN

## 2012-03-22 MED ORDER — MIDAZOLAM HCL 10 MG/2ML IJ SOLN
INTRAMUSCULAR | Status: DC | PRN
  Administered 2012-03-22: 2 mg via INTRAVENOUS

## 2012-03-22 MED ORDER — FENTANYL CITRATE 0.05 MG/ML IJ SOLN
INTRAMUSCULAR | Status: DC | PRN
  Administered 2012-03-22: 25 ug via INTRAVENOUS

## 2012-03-22 MED ORDER — FENTANYL CITRATE 0.05 MG/ML IJ SOLN
INTRAMUSCULAR | Status: AC
  Filled 2012-03-22: qty 4

## 2012-03-22 NOTE — Evaluation (Signed)
Physical Therapy Evaluation Patient Details Name: Steve Mccarty MRN: [id number] DOB: 1945-11-28 Today's Date: 03/22/2012 Time: 3086-5784 PT Time Calculation (min): 20 min  PT Assessment / Plan / Recommendation Clinical Impression  Pt admitted for GI bleed, hypotension, and low Hgb.  Pt would benefit from acute PT services in order to improve safety and independence with gait and stairs prior to d/c home alone.  Pt reports no falls inside home however reports he has fallen in yard.  Pt agreeable to RW and HHPT.    PT Assessment  Patient needs continued PT services    Follow Up Recommendations  Home health PT (home safety eval)    Barriers to Discharge        Equipment Recommendations  Rolling walker with 5" wheels    Recommendations for Other Services     Frequency Min 3X/week    Precautions / Restrictions Precautions Precautions: Fall   Pertinent Vitals/Pain No pain      Mobility  Bed Mobility Details for Bed Mobility Assistance: pt sitting EOB on arrival Transfers Transfers: Stand to Sit;Sit to Stand Sit to Stand: 4: Min guard;From bed Stand to Sit: 4: Min guard;To chair/3-in-1 Details for Transfer Assistance: for safety, verbal cues for hand placement Ambulation/Gait Ambulation/Gait Assistance: 4: Min guard Ambulation Distance (Feet): 200 Feet Assistive device: Straight cane Ambulation/Gait Assistance Details: assist for safety, pt slightly unsteady but no physical assist required, pt tried RW for 30 feet back to recliner and felt more steady Gait Pattern: Step-through pattern    Exercises     PT Diagnosis: Difficulty walking  PT Problem List: Decreased mobility;Decreased knowledge of use of DME PT Treatment Interventions: DME instruction;Gait training;Stair training;Functional mobility training;Therapeutic activities;Therapeutic exercise;Patient/family education   PT Goals Acute Rehab PT Goals PT Goal Formulation: With patient Time For Goal Achievement:  03/29/12 Potential to Achieve Goals: Good Pt will go Sit to Stand: with modified independence PT Goal: Sit to Stand - Progress: Goal set today Pt will go Stand to Sit: with modified independence PT Goal: Stand to Sit - Progress: Goal set today Pt will Ambulate: >150 feet;with modified independence;with least restrictive assistive device PT Goal: Ambulate - Progress: Goal set today Pt will Go Up / Down Stairs: 3-5 stairs;with least restrictive assistive device;with modified independence PT Goal: Up/Down Stairs - Progress: Goal set today  Visit Information  Last PT Received On: 03/22/12 Assistance Needed: +1    Subjective Data  Subjective: I need one of those walker with a seat but I can't work the breaks because of my hands.   Prior Functioning  Home Living Lives With: Alone Type of Home: House Home Access: Stairs to enter Entrance Stairs-Number of Steps: 3-4 Entrance Stairs-Rails: Right Home Layout: One level Home Adaptive Equipment: Straight cane Additional Comments: pt reports using cane and rail/siding for stairs Prior Function Level of Independence: Independent with assistive device(s) Comments: pt reports SPC use for stairs and community, no assistive device inside home Communication Communication: No difficulties    Cognition  Overall Cognitive Status: Difficult to assess Arousal/Alertness: Awake/alert Orientation Level: Appears intact for tasks assessed Cognition - Other Comments: Pt present with decreased safety awareness.    Extremity/Trunk Assessment Right Upper Extremity Assessment RUE ROM/Strength/Tone: Deficits RUE ROM/Strength/Tone Deficits: 5th and 4th digits flexion contractures Left Upper Extremity Assessment LUE ROM/Strength/Tone: Deficits LUE ROM/Strength/Tone Deficits: 5th and 4th digits flexion contractures   Balance    End of Session PT - End of Session Equipment Utilized During Treatment: Gait belt Activity Tolerance: Patient  tolerated treatment  well Patient left: in chair;with call bell/phone within reach;with chair alarm set  GP     Steve Mccarty,Steve Mccarty 03/22/2012, 2:34 PM Pager: [phone]

## 2012-03-22 NOTE — Interval H&P Note (Signed)
History and Physical Interval Note:  03/22/2012 9:10 AM  Steve Mccarty  has presented today for surgery, with the diagnosis of GI bleed  The various methods of treatment have been discussed with the patient and family. After consideration of risks, benefits and other options for treatment, the patient has consented to  Procedure(s) (LRB): ESOPHAGOGASTRODUODENOSCOPY (EGD) (N/A) as a surgical intervention .  The patient's history has been reviewed, patient examined, no change in status, stable for surgery.  I have reviewed the patients' chart and labs.  Questions were answered to the patient's satisfaction.     Monroe Toure D

## 2012-03-22 NOTE — Discharge Summary (Signed)
Physician Discharge Summary  Steve Mccarty UUV:[id number] DOB: [birth year] DOA: 03/20/2012  PCP: No primary provider on file.  Admit date: 03/20/2012 Discharge date: 03/22/2012  Recommendations for Outpatient Follow-up:  1. Please note that patient was advised to schedule followup appointment with Dr. Elnoria Howard, GI for reevaluation  2. Patient will need to have CBC checked to ensure that hemoglobin stays at baseline 3. The patient was also advised to take stool softener to prevent constipation 4. Patient will also have to have a BMP checked to ensure that creatinine continues to trend down 5. Please also note that following medicines were held during the hospitalization given low blood pressure: Torsemide, spironolactone, Lisinopril, coreg 6. Upon discharge patient was advised to stop taking Coreg and lisinopril 7. We have resumed low-dose torsemide 50 mg tablet once daily with spironolactone 50 mg tablet once daily 8. Blood pressure medication regimen can be readjusted as indicated  Discharge Diagnoses: Acute blood loss anemia secondary to diverticular bleed  Principal Problem:  *GI bleed Active Problems:  Hypotension  ARF (acute renal failure)  Diabetes mellitus type 2 with complications  Hyponatremia  Leukocytosis  Discharge Condition: Stable  Diet recommendation: General diet  History of present illness:  Pt is 66 yo male who presents to [facility] with main concern of rectal bleed that he initially noted 1-2 days prior to admission and has been associated with persistent fatigue that he first noticed 2 weeks prior to admission. He explains that his symptoms were so bothersome that he was not able to carry the activities of daily living. He also experience shortness of breath especially with exertion, generalized weakness. He denies chest pain, fevers, chills, or similar episodes in the past. No recent sicknesses, no sick contacts, no traumas. He reports being on Xeralto. He denies syncopal  events, no dizziness, no headaches, no specific focal neurologic weakness.   Hospital Course:   Principal Problem:  *GI bleed  - This was determined to be most likely secondary to diverticular bleed - Patient has required 3 units of red blood cell transfusion - Hemoglobin has remained stable and on day of discharge it is at patient's baseline - Diet was advanced and patient tolerating well  Active Problems:  Hypotension  - likely of pre renal etiology  - Blood pressure medications were held temporarily - Blood pressure has improved during the hospital stay and patient can resume low-dose torsemide 50 mg tablet once daily along with spironolactone 50 mg tablet QD  ARF (acute renal failure)  - likely of pre renal etiology  - creatinine is trending down   Diabetes mellitus type 2 with complications  - well controlled during the hospital stay  Hyponatremia  - Na remains stable   Leukocytosis  - likely reactive  - WBC is within normal limits this AM   Consultants:  GI - Dr. Elnoria Howard  Procedures:  EGD 03/21/2012 -  Mild gastritis. The patient most likely had a diverticular bleed. The current upper GI findings cannot explain the anemia.  RECOMMENDATIONS: 1) Supportive care. 2) Follow up in the office in 2-4 weeks.   Antibiotics:  None  Discharge Exam: Filed Vitals:   03/22/12 1300  BP: 92/60  Pulse: 82  Temp: 97.6 F (36.4 C)  Resp: 18   Filed Vitals:   03/22/12 0955 03/22/12 1000 03/22/12 1005 03/22/12 1300  BP: 99/70 98/55 98/55  92/60  Pulse:    82  Temp:    97.6 F (36.4 C)  TempSrc:    Oral  Resp:  13 15 10 18   Height:      Weight:      SpO2: 100% 100% 100% 95%    General: Pt is alert, follows commands appropriately, not in acute distress Cardiovascular: Regular rate and rhythm, S1/S2 +, no murmurs, no rubs, no gallops Respiratory: Clear to auscultation bilaterally, no wheezing, no crackles, no rhonchi Abdominal: Soft, non tender, non distended, bowel sounds  +, no guarding Extremities: no edema, no cyanosis, pulses palpable bilaterally DP and PT Neuro: Grossly nonfocal  Discharge Instructions  Discharge Orders    Future Orders Please Complete By Expires   Diet - low sodium heart healthy      Increase activity slowly      Medication List  As of 03/22/2012  3:12 PM   START taking these medications         HYDROcodone-acetaminophen 5-325 MG per tablet   Commonly known as: NORCO   Take 1-2 tablets by mouth every 4 (four) hours as needed.         CHANGE how you take these medications         docusate sodium 100 MG capsule   Commonly known as: COLACE   Take 1 capsule (100 mg total) by mouth 2 (two) times daily as needed for constipation.   What changed: - how often to take the med - reasons to take the med         CONTINUE taking these medications         aspirin EC 81 MG tablet      cholecalciferol 1000 UNITS tablet   Commonly known as: VITAMIN D      multivitamin with minerals Tabs      omeprazole 20 MG capsule   Commonly known as: PRILOSEC      PHILLIPS 500 MG (LAX) Tabs   Generic drug: Magnesium Oxide      pravastatin 40 MG tablet   Commonly known as: PRAVACHOL      rivaroxaban 10 MG Tabs tablet   Commonly known as: XARELTO      spironolactone 50 MG tablet   Commonly known as: ALDACTONE      torsemide 100 MG tablet   Commonly known as: DEMADEX   Take 0.5 tablets (50 mg total) by mouth daily.      traMADol 50 MG tablet   Commonly known as: ULTRAM         STOP taking these medications         carvedilol 25 MG tablet          Where to get your medications    These are the prescriptions that you need to pick up. We sent them to a specific pharmacy, so you will need to go there to get them.   [id number] [name], [place] - [phone] AT [facility] [address]    [address] [place] [address]    Phone: [phone]        torsemide 100 MG tablet         You may  get these medications from any pharmacy.         docusate sodium 100 MG capsule   HYDROcodone-acetaminophen 5-325 MG per tablet            Follow-up Information    Follow up with HUNG,PATRICK D, MD in 3 weeks.   Contact information:   [address], [address] [place] [address] [phone]  The results of significant diagnostics from this hospitalization (including imaging, microbiology, ancillary and laboratory) are listed below for reference.    Significant Diagnostic Studies:  US Abdomen Complete 02/29/2012    IMPRESSION:   1.  No gallstones are noted within gallbladder.  No sonographic Murphy's sign.  2.  Fatty infiltration of the liver.  3.  Mild splenomegaly with spleen measuring 12.5 cm in length.  4.   Large abdominal ascites.  5.  No hydronephrosis or diagnostic renal calculus.    Dg Chest Port 1 View 03/20/2012    IMPRESSION:  Tiny right pleural effusion and cardiomegaly without edema.    Labs: Basic Metabolic Panel:  Lab 03/22/12 0865 03/21/12 0411 03/20/12 1200  NA 130* 126* 127*  K 3.5 3.6 4.2  CL 97 91* 87*  CO2 21 22 20   GLUCOSE 112* 134* 192*  BUN 82* 124* 162*  CREATININE 1.78* 2.26* 2.90*  CALCIUM 9.4 9.8 10.0  MG -- -- 3.5*  PHOS -- -- 4.1   Liver Function Tests:  Lab 03/20/12 1200  AST 19  ALT 10  ALKPHOS 65  BILITOT 1.1  PROT 7.8  ALBUMIN 4.2   CBC:  Lab 03/22/12 0405 03/21/12 0411 03/20/12 2314 03/20/12 1200  WBC 7.2 9.4 9.7 11.1*  NEUTROABS -- -- -- --  HGB 8.6* 7.9* 8.2* 6.7*  HCT 24.9* 22.6* 23.4* 19.4*  MCV 79.3 78.5 78.3 78.2  PLT 145* 170 187 249   CBG:  Lab 03/22/12 1109 03/22/12 0742 03/22/12 0604 03/21/12 1718 03/21/12 1234  GLUCAP 111* 127* 125* 121* 155*    Time coordinating discharge: Over 30 minutes  Signed:  Debbora Presto, MD  [facility] 03/22/2012, 2:06 PM Pager [phone]  If 7PM-7AM, please contact night-coverage [email or web address] Password  [email or web address]

## 2012-03-22 NOTE — Progress Notes (Signed)
Talked to patient about DCP; patient is independent, continues to drive his car, goes shopping and whatever else he wants; Physical Therapy recommends home PT for safety eval - patient stated " Hell no, I don't need that, give it to some one who needs it"; patient was agreeable to rolling walker; [facility] called for RW; B Ave Filter RN, BSN, [place].

## 2012-03-22 NOTE — H&P (View-Only) (Signed)
Patient ID: Steve Mccarty, male   DOB: 01/20/1946, 65 y.o.   MRN: [id number]  [facility] PROGRESS NOTE  Steve Mccarty MRN:[id number] DOB: 12/18/1945 DOA: 03/20/2012 PCP: No primary provider on file.  Brief narrative: Pt is 65 yo male who was admitted 03/20/2012 with main concern of progressively worsening fatigue and was found to have Hg 6.7 on admission. He is now s/p 3 units PRBC transfusion with Hg 7.9 today. FOBT positive. GI consulted. Please note that pt did reports weight loss over the past 6 months but unclear how much weight, he thinks ~ 40 lbs.  ASSESSMENT AND PLAN:  Principal Problem:  *GI bleed - still unclear source - GI consulted for further recommendations on management - pt is now s/t 3 units PRBC transfussion - will obtain CBC in AM - will keep NPO for now until seen by GI  Active Problems:  Hypotension - likely of pre renal etiology - will hold home blood pressure medications for now - continue to monitor vitals per floor protocol   ARF (acute renal failure) - likely of pre renal etiology - creatinine is trending down - BMP in AM   Diabetes mellitus type 2 with complications - well controlled - will continue to monitor CBG and will readjust the regimen as indicated   Hyponatremia - Na remains stable - BMP in AM   Leukocytosis - likely reactive - WBC is within normal limits this AM - CBC in AM  Consultants:  GI  Procedures:  None  Antibiotics:  None  HPI/Subjective: No events overnight. Pt reports feeling better this AM.  Objective: Filed Vitals:   03/21/12 0800 03/21/12 1055 03/21/12 1135 03/21/12 1235  BP: 93/57 93/63 90/57 93/57  Pulse: 84 110 108   Temp:  98.2 F (36.8 C) 98.1 F (36.7 C) 98.2 F (36.8 C)  TempSrc:  Oral Oral Oral  Resp:  18 18 20  SpO2:        Intake/Output Summary (Last 24 hours) at 03/21/12 1300 Last data filed at 03/21/12 1235  Gross per 24 hour  Intake 2577.5 ml  Output   2675 ml  Net   -97.5 ml    Exam:   General:  Pt is alert, follows commands appropriately, not in acute distress  Cardiovascular: Regular rhythm, tachycardic, S1/S2, no murmurs, no rubs, no gallops  Respiratory: Clear to auscultation bilaterally, no wheezing, no crackles, no rhonchi  Abdomen: Soft, non tender, non distended, bowel sounds present, no guarding  Extremities: No edema, pulses DP and PT palpable bilaterally  Neuro: Grossly nonfocal  Data Reviewed: Basic Metabolic Panel:  Lab 03/21/12 0411 03/20/12 1200  NA 126* 127*  K 3.6 4.2  CL 91* 87*  CO2 22 20  GLUCOSE 134* 192*  BUN 124* 162*  CREATININE 2.26* 2.90*  CALCIUM 9.8 10.0  MG -- 3.5*  PHOS -- 4.1   Liver Function Tests:  Lab 03/20/12 1200  AST 19  ALT 10  ALKPHOS 65  BILITOT 1.1  PROT 7.8  ALBUMIN 4.2   CBC:  Lab 03/21/12 0411 03/20/12 2314 03/20/12 1200  WBC 9.4 9.7 11.1*  NEUTROABS -- -- --  HGB 7.9* 8.2* 6.7*  HCT 22.6* 23.4* 19.4*  MCV 78.5 78.3 78.2  PLT 170 187 249   CBG:  Lab 03/21/12 1234 03/21/12 0703 03/20/12 1816  GLUCAP 155* 136* 241*    No results found for this or any previous visit (from the past 240 hour(s)).   Studies: Dg Chest Port 1   View  03/20/2012  *RADIOLOGY REPORT*  Clinical Data: GI bleed.  PORTABLE CHEST - 1 VIEW  Comparison: PA and lateral chest 07/18/2011.  Findings: Tiny right pleural effusion is noted.  No left pleural effusion is seen.  There is cardiomegaly but no pulmonary edema. The lungs are clear.  IMPRESSION: Tiny right pleural effusion and cardiomegaly without edema.  Original Report Authenticated By: THOMAS L. D'ALESSIO, M.D.    Scheduled Meds:   . carvedilol  12.5 mg Oral BID WC  . insulin aspart  0-9 Units Subcutaneous TID WC  . simvastatin  10 mg Oral q1800   Continuous Infusions:   . pantoprozole (PROTONIX) infusion 8 mg/hr (03/21/12 0048)     Code Status: Full Family Communication: Pt at bedside Disposition Plan: PT evaluation  MAGICK-Evamae Rowen,  Lamonica Trueba, MD  [facility] Pager [phone]  If 7PM-7AM, please contact night-coverage [email or web address] Password [email or web address] 03/21/2012, 1:00 PM   LOS: 1 day      

## 2012-03-22 NOTE — Op Note (Signed)
[facility] [address] [place], [place]  [address]  OPERATIVE PROCEDURE REPORT  PATIENT:  Steve Mccarty, Steve Mccarty  MR#:  [id number] BIRTHDATE:  19-Jan-1946  GENDER:  male ENDOSCOPIST:  Jeani Hawking, MD PROCEDURE DATE:  03/22/2012 PROCEDURE:  EGD, diagnostic [phone] ASA CLASS:  Class III INDICATIONS:  Melena and Anemia MEDICATIONS:  Fentanyl 25 mcg IV, Versed 2 mg IV TOPICAL ANESTHETIC:  Cetacaine Spray  DESCRIPTION OF PROCEDURE:   After the risks benefits and alternatives of the procedure were thoroughly explained, informed consent was obtained.  The  endoscope was introduced through the mouth and advanced to the second portion of the duodenum, without limitations.  The instrument was slowly withdrawn as the mucosa was fully examined. <<PROCEDUREIMAGES>>  FINDINGS:  The esophagus was normal. Upon entry into the gastric lumen there was evidence of some hematin. With careful and prolonged inspection of the gastric mucosa the source of the hematin was not identified. No overt evidence of ulcerations, erosions, polyps, masses, or vascular abnormalities. The antrum exhibited a mild gastritis. There was no evidence of portal hypertensive gastropathy, fundic varices, or esophageal varices to suggest cirrhosis.    Deep intuation of the duodenum into the fourth portion of the duodenum was negative for an pathology. Retroflexed views revealed no abnormalities.    The scope was then withdrawn from the patient and the procedure terminated.  COMPLICATIONS:  None  IMPRESSION:  1) Mild gastritis.  The patient most likely had a diverticular bleed.  The current upper GI findings cannot explain the anemia. RECOMMENDATIONS:  1) Supportive care. 2) Follow up in the office in 2-4 weeks. 3) Signing off.  ______________________________ Jeani Hawking, MD  n. Rosalie DoctorJeani Hawking at 03/22/2012 09:30 AM  Theora Master, [phone]

## 2012-03-25 ENCOUNTER — Encounter (HOSPITAL_COMMUNITY): Payer: Self-pay | Admitting: Gastroenterology

## 2012-04-04 ENCOUNTER — Ambulatory Visit (HOSPITAL_COMMUNITY)
Admission: RE | Admit: 2012-04-04 | Discharge: 2012-04-04 | Disposition: A | Discharge status: Home / Self Care | Payer: Medicare Other | Source: Ambulatory Visit | Attending: Gastroenterology | Admitting: Gastroenterology

## 2012-04-04 ENCOUNTER — Encounter (HOSPITAL_COMMUNITY)
Admission: RE | Disposition: A | Discharge status: Home / Self Care | Payer: Self-pay | Source: Ambulatory Visit | Attending: Gastroenterology

## 2012-04-04 ENCOUNTER — Encounter (HOSPITAL_COMMUNITY): Payer: Self-pay | Admitting: *Deleted

## 2012-04-04 DIAGNOSIS — I4891 Unspecified atrial fibrillation: Secondary | ICD-10-CM | POA: Insufficient documentation

## 2012-04-04 DIAGNOSIS — E119 Type 2 diabetes mellitus without complications: Secondary | ICD-10-CM | POA: Insufficient documentation

## 2012-04-04 DIAGNOSIS — I2510 Atherosclerotic heart disease of native coronary artery without angina pectoris: Secondary | ICD-10-CM | POA: Insufficient documentation

## 2012-04-04 DIAGNOSIS — R195 Other fecal abnormalities: Secondary | ICD-10-CM | POA: Insufficient documentation

## 2012-04-04 DIAGNOSIS — D649 Anemia, unspecified: Secondary | ICD-10-CM | POA: Insufficient documentation

## 2012-04-04 DIAGNOSIS — K2960 Other gastritis without bleeding: Secondary | ICD-10-CM | POA: Insufficient documentation

## 2012-04-04 DIAGNOSIS — I10 Essential (primary) hypertension: Secondary | ICD-10-CM | POA: Insufficient documentation

## 2012-04-04 DIAGNOSIS — Z79899 Other long term (current) drug therapy: Secondary | ICD-10-CM | POA: Insufficient documentation

## 2012-04-04 DIAGNOSIS — I509 Heart failure, unspecified: Secondary | ICD-10-CM | POA: Insufficient documentation

## 2012-04-04 DIAGNOSIS — Z794 Long term (current) use of insulin: Secondary | ICD-10-CM | POA: Insufficient documentation

## 2012-04-04 HISTORY — DX: Melena: K92.1

## 2012-04-04 HISTORY — PX: ESOPHAGOGASTRODUODENOSCOPY: SHX5428

## 2012-04-04 HISTORY — DX: Gastro-esophageal reflux disease without esophagitis: K21.9

## 2012-04-04 SURGERY — EGD (ESOPHAGOGASTRODUODENOSCOPY)
Anesthesia: Moderate Sedation

## 2012-04-04 MED ORDER — MIDAZOLAM HCL 10 MG/2ML IJ SOLN
INTRAMUSCULAR | Status: DC | PRN
  Administered 2012-04-04: 1 mg via INTRAVENOUS
  Administered 2012-04-04: 2 mg via INTRAVENOUS
  Administered 2012-04-04 (×2): 1 mg via INTRAVENOUS

## 2012-04-04 MED ORDER — FENTANYL CITRATE 0.05 MG/ML IJ SOLN
INTRAMUSCULAR | Status: DC | PRN
  Administered 2012-04-04: 25 ug via INTRAVENOUS
  Administered 2012-04-04 (×2): 12.5 ug via INTRAVENOUS

## 2012-04-04 MED ORDER — FENTANYL CITRATE 0.05 MG/ML IJ SOLN
INTRAMUSCULAR | Status: AC
  Filled 2012-04-04: qty 2

## 2012-04-04 MED ORDER — MIDAZOLAM HCL 10 MG/2ML IJ SOLN
INTRAMUSCULAR | Status: AC
  Filled 2012-04-04: qty 2

## 2012-04-04 MED ORDER — SODIUM CHLORIDE 0.9 % IV SOLN
INTRAVENOUS | Status: DC
  Administered 2012-04-04: 500 mL via INTRAVENOUS

## 2012-04-04 MED ORDER — BUTAMBEN-TETRACAINE-BENZOCAINE 2-2-14 % EX AERO
INHALATION_SPRAY | CUTANEOUS | Status: DC | PRN
  Administered 2012-04-04: 2 via TOPICAL

## 2012-04-04 NOTE — Interval H&P Note (Signed)
History and Physical Interval Note:  04/04/2012 2:37 PM  Steve Mccarty  has presented today for surgery, with the diagnosis of anemia  The various methods of treatment have been discussed with the patient and family. After consideration of risks, benefits and other options for treatment, the patient has consented to  Procedure(s) (LRB): ESOPHAGOGASTRODUODENOSCOPY (EGD) (N/A) as a surgical intervention .  The patient's history has been reviewed, patient examined, no change in status, stable for surgery.  I have reviewed the patient's chart and labs.  Questions were answered to the patient's satisfaction.     Genine Beckett D

## 2012-04-04 NOTE — H&P (View-Only) (Signed)
Reason for Consult: Hematochezia and Anemia Referring Physician: [facility]  Steve Mccarty HPI: This is a 66 year old patient who is admitted for complaints of fatigue and persistent hematochezia.  The patient is a very poor and unreliable historian.  From what I can discern he started to have issues with fatigue.  He reports having issues with hematochezia for the past week, but he gives a history of melena greater than 4 weeks.  He states that he was evaluated by Dr. Rennis Golden for bleeding issues and then told to follow up with Dr. Ronne Binning.  I was able to obtain notes from Drs. Hilty and McKenzie.  He apparently was being managed for cardiac issues.  He was noted to have a weight gain and it was noted that this was secondary to his ascites.  He has a history of CHF, but there is also the thought that he may have cirrhosis.  A RUQ U/S was performed and it revealed a fatty liver and ascites.  I evaluated the patient on 11/03/2011 with an EGD/Colonoscopy for a mild anemia at 9.2 g/dL with findings of a nonspecific gastritis and colonic diverticulosis.  No overt evidence of bleeding.  No identification of esophageal varices or portal HTN gastropathy.  Past Medical History  Diagnosis Date  . Atrial fibrillation   . Diabetes mellitus   . Hypertension   . Atrial fibrillation   . Coronary artery disease   . CHF (congestive heart failure)     perpheral edema  . Chronic renal disease, stage III   . Joint pain   . Aortic valve insufficiency, rheumatic     Past Surgical History  Procedure Date  . Tonsillectomy   . Cardiac catheterization   . Esophagogastroduodenoscopy 11/03/2011    Procedure: ESOPHAGOGASTRODUODENOSCOPY (EGD);  Surgeon: Theda Belfast, MD;  Location: [facility];  Service: Endoscopy;  Laterality: N/A;  . Colonoscopy 11/03/2011    Procedure: COLONOSCOPY;  Surgeon: Theda Belfast, MD;  Location: [facility];  Service: Endoscopy;  Laterality: N/A;    History reviewed. No  pertinent family history.  Social History:  reports that he has never smoked. He has never used smokeless tobacco. He reports that he drinks alcohol. He reports that he does not use illicit drugs.  Allergies: No Known Allergies  Medications:  Scheduled:   . carvedilol  12.5 mg Oral BID WC  . insulin aspart  0-9 Units Subcutaneous TID WC  . simvastatin  10 mg Oral q1800  . sodium chloride  3 mL Intravenous Q12H  . DISCONTD: insulin aspart  0-9 Units Subcutaneous Q4H  . DISCONTD: insulin aspart  0-9 Units Subcutaneous TID WC   Continuous:   . sodium chloride    . pantoprozole (PROTONIX) infusion 8 mg/hr (03/21/12 0048)    Results for orders placed during the hospital encounter of 03/20/12 (from the past 24 hour(s))  GLUCOSE, CAPILLARY     Status: Abnormal   Collection Time   03/20/12  6:16 PM      Component Value Range   Glucose-Capillary 241 (*) 70 - 99 mg/dL   Comment 1 Documented in Chart     Comment 2 Notify RN    HEMOGLOBIN A1C     Status: Abnormal   Collection Time   03/20/12 11:14 PM      Component Value Range   Hemoglobin A1C 6.2 (*) <5.7 %   Mean Plasma Glucose 131 (*) <117 mg/dL  CBC     Status: Abnormal   Collection Time  03/20/12 11:14 PM      Component Value Range   WBC 9.7  4.0 - 10.5 K/uL   RBC 2.99 (*) 4.22 - 5.81 MIL/uL   Hemoglobin 8.2 (*) 13.0 - 17.0 g/dL   HCT 96.0 (*) 45.4 - 09.8 %   MCV 78.3  78.0 - 100.0 fL   MCH 27.4  26.0 - 34.0 pg   MCHC 35.0  30.0 - 36.0 g/dL   RDW 11.9 (*) 14.7 - 82.9 %   Platelets 187  150 - 400 K/uL  BASIC METABOLIC PANEL     Status: Abnormal   Collection Time   03/21/12  4:11 AM      Component Value Range   Sodium 126 (*) 135 - 145 mEq/L   Potassium 3.6  3.5 - 5.1 mEq/L   Chloride 91 (*) 96 - 112 mEq/L   CO2 22  19 - 32 mEq/L   Glucose, Bld 134 (*) 70 - 99 mg/dL   BUN 562 (*) 6 - 23 mg/dL   Creatinine, Ser 1.30 (*) 0.50 - 1.35 mg/dL   Calcium 9.8  8.4 - 86.5 mg/dL   GFR calc non Af Amer 29 (*) >90 mL/min   GFR  calc Af Amer 33 (*) >90 mL/min  CBC     Status: Abnormal   Collection Time   03/21/12  4:11 AM      Component Value Range   WBC 9.4  4.0 - 10.5 K/uL   RBC 2.88 (*) 4.22 - 5.81 MIL/uL   Hemoglobin 7.9 (*) 13.0 - 17.0 g/dL   HCT 78.4 (*) 69.6 - 29.5 %   MCV 78.5  78.0 - 100.0 fL   MCH 27.4  26.0 - 34.0 pg   MCHC 35.0  30.0 - 36.0 g/dL   RDW 28.4 (*) 13.2 - 44.0 %   Platelets 170  150 - 400 K/uL  GLUCOSE, CAPILLARY     Status: Abnormal   Collection Time   03/21/12  7:03 AM      Component Value Range   Glucose-Capillary 136 (*) 70 - 99 mg/dL   Comment 1 Notify RN     Comment 2 Documented in Chart    PREPARE RBC (CROSSMATCH)     Status: Normal   Collection Time   03/21/12  9:00 AM      Component Value Range   Order Confirmation ORDER PROCESSED BY BLOOD BANK    GLUCOSE, CAPILLARY     Status: Abnormal   Collection Time   03/21/12 12:34 PM      Component Value Range   Glucose-Capillary 155 (*) 70 - 99 mg/dL   Comment 1 Documented in Chart     Comment 2 Notify RN       Dg Chest Port 1 View  03/20/2012  *RADIOLOGY REPORT*  Clinical Data: GI bleed.  PORTABLE CHEST - 1 VIEW  Comparison: PA and lateral chest 07/18/2011.  Findings: Tiny right pleural effusion is noted.  No left pleural effusion is seen.  There is cardiomegaly but no pulmonary edema. The lungs are clear.  IMPRESSION: Tiny right pleural effusion and cardiomegaly without edema.  Original Report Authenticated By: Bernadene Bell. D'ALESSIO, M.D.    ROS:  As stated above in the HPI otherwise negative.  Blood pressure 96/61, pulse 106, temperature 97.8 F (36.6 C), temperature source Oral, resp. rate 19, SpO2 98.00%.    PE: Gen: NAD, Alert and Oriented HEENT:  [place]/AT, EOMI Neck: Supple, no LAD Lungs: CTA Bilaterally CV: RRR without  M/G/R ABM: Soft, distended, +ascites, +BS Ext: No C/C/E  Assessment/Plan: 1) GI bleed. 2) Ascites - ? Cardiac versus hepatic source.   As stated above the patient is a very unreliable historian.   I think his acute bleeding episode started one week ago, but he insists on having problems with melena prior to this time.  If he did not have the melena his symptoms would be consistent with a diverticular bleed, which I believe is the real issue for his GI bleeding.  Again, the EGD and Colonoscopy were relatively unrevealing.  However, with his complaints of melena I will perform an EGD.  If it is negative, I would hold on a colonoscopy as it will be consistent with a diverticular bleed.  As for his ascites he will require a diagnostic paracentesis and a check of his SAAG with total protein.  If the SAAG is >1.1 and TP <2.5 he has portal HTN.  If the SAAG is >1.1 and TP >2.5 this is consistent with a cardiac source.  Plan: 1) EGD tomorrow. 2) Follow HGB. 3) Diagnostic paracentesis.  Kyliegh Jester D 03/21/2012, 2:29 PM

## 2012-04-04 NOTE — Op Note (Signed)
[facility] [address] [place], [place]  [address]  OPERATIVE PROCEDURE REPORT  PATIENT:  Steve Mccarty, Steve Mccarty  MR#:  [id number] BIRTHDATE:  13-Nov-1945  GENDER:  male ENDOSCOPIST:  Jeani Hawking, MD ASSISTANT:  Kandice Robinsons and Dwain Sarna, RN CGRN PROCEDURE DATE:  04/04/2012 PROCEDURE:  EGD, diagnostic [phone] ASA CLASS:  Class III INDICATIONS:  Anemia, Heme positive stool, and Melena MEDICATIONS:  Fentanyl 50 mcg IV, Versed 4 mg IV TOPICAL ANESTHETIC:  Cetacaine Spray  DESCRIPTION OF PROCEDURE:   After the risks benefits and alternatives of the procedure were thoroughly explained, informed consent was obtained.  The Pentax Gastroscope [id number] endoscope was introduced through the mouth and advanced to the second portion of the duodenum, without limitations.  The instrument was slowly withdrawn as the mucosa was fully examined. <<PROCEDUREIMAGES>>  FINDINGS:  No overt evidence of any blood in the gastric lumen, however, there was evidence of erosions in the antrum. This is the site that exhibited gastritis in the past. No evidence of any AVMs or ulcerations.    Retroflexed views revealed no abnormalities. The scope was then withdrawn from the patient and the procedure terminated.  COMPLICATIONS:  None  IMPRESSION:  1) Antral erosions without bleeding. RECOMMENDATIONS:  1) PPI. 2) CBC in 2 weeks. 3) Follow up in the office in 4 weeks.  ______________________________ Jeani Hawking, MD  n. Rosalie DoctorJeani Hawking at 04/04/2012 03:05 PM  Theora Master, [phone]

## 2012-04-05 ENCOUNTER — Encounter (HOSPITAL_COMMUNITY): Payer: Self-pay

## 2012-04-05 ENCOUNTER — Encounter (HOSPITAL_COMMUNITY): Payer: Self-pay | Admitting: Gastroenterology

## 2012-05-05 ENCOUNTER — Other Ambulatory Visit (HOSPITAL_COMMUNITY): Payer: Self-pay | Admitting: Internal Medicine

## 2012-06-03 ENCOUNTER — Inpatient Hospital Stay (HOSPITAL_COMMUNITY): Payer: Medicare Other

## 2012-06-03 ENCOUNTER — Inpatient Hospital Stay (HOSPITAL_COMMUNITY)
Admission: EM | Admit: 2012-06-03 | Discharge: 2012-06-09 | DRG: 378 | Disposition: A | Discharge status: Home / Self Care | Payer: Medicare Other | Attending: Internal Medicine | Admitting: Internal Medicine

## 2012-06-03 ENCOUNTER — Encounter (HOSPITAL_COMMUNITY): Payer: Self-pay | Admitting: Neurology

## 2012-06-03 DIAGNOSIS — D62 Acute posthemorrhagic anemia: Secondary | ICD-10-CM | POA: Diagnosis present

## 2012-06-03 DIAGNOSIS — I480 Paroxysmal atrial fibrillation: Secondary | ICD-10-CM | POA: Diagnosis present

## 2012-06-03 DIAGNOSIS — D696 Thrombocytopenia, unspecified: Secondary | ICD-10-CM | POA: Diagnosis present

## 2012-06-03 DIAGNOSIS — I509 Heart failure, unspecified: Secondary | ICD-10-CM | POA: Diagnosis present

## 2012-06-03 DIAGNOSIS — I5022 Chronic systolic (congestive) heart failure: Secondary | ICD-10-CM | POA: Diagnosis present

## 2012-06-03 DIAGNOSIS — E871 Hypo-osmolality and hyponatremia: Secondary | ICD-10-CM | POA: Diagnosis present

## 2012-06-03 DIAGNOSIS — Z23 Encounter for immunization: Secondary | ICD-10-CM

## 2012-06-03 DIAGNOSIS — Z7901 Long term (current) use of anticoagulants: Secondary | ICD-10-CM

## 2012-06-03 DIAGNOSIS — E876 Hypokalemia: Secondary | ICD-10-CM | POA: Diagnosis present

## 2012-06-03 DIAGNOSIS — K922 Gastrointestinal hemorrhage, unspecified: Secondary | ICD-10-CM | POA: Diagnosis present

## 2012-06-03 DIAGNOSIS — E119 Type 2 diabetes mellitus without complications: Secondary | ICD-10-CM | POA: Diagnosis present

## 2012-06-03 DIAGNOSIS — Z79899 Other long term (current) drug therapy: Secondary | ICD-10-CM

## 2012-06-03 DIAGNOSIS — K5731 Diverticulosis of large intestine without perforation or abscess with bleeding: Secondary | ICD-10-CM | POA: Diagnosis present

## 2012-06-03 DIAGNOSIS — I255 Ischemic cardiomyopathy: Secondary | ICD-10-CM

## 2012-06-03 DIAGNOSIS — E118 Type 2 diabetes mellitus with unspecified complications: Secondary | ICD-10-CM | POA: Diagnosis present

## 2012-06-03 DIAGNOSIS — I50812 Chronic right heart failure: Secondary | ICD-10-CM | POA: Diagnosis present

## 2012-06-03 DIAGNOSIS — N179 Acute kidney failure, unspecified: Secondary | ICD-10-CM | POA: Diagnosis present

## 2012-06-03 DIAGNOSIS — I2510 Atherosclerotic heart disease of native coronary artery without angina pectoris: Secondary | ICD-10-CM | POA: Diagnosis present

## 2012-06-03 DIAGNOSIS — I4891 Unspecified atrial fibrillation: Secondary | ICD-10-CM | POA: Diagnosis present

## 2012-06-03 DIAGNOSIS — I959 Hypotension, unspecified: Secondary | ICD-10-CM | POA: Diagnosis present

## 2012-06-03 DIAGNOSIS — K254 Chronic or unspecified gastric ulcer with hemorrhage: Principal | ICD-10-CM | POA: Diagnosis present

## 2012-06-03 DIAGNOSIS — I2589 Other forms of chronic ischemic heart disease: Secondary | ICD-10-CM | POA: Diagnosis present

## 2012-06-03 DIAGNOSIS — I129 Hypertensive chronic kidney disease with stage 1 through stage 4 chronic kidney disease, or unspecified chronic kidney disease: Secondary | ICD-10-CM | POA: Diagnosis present

## 2012-06-03 DIAGNOSIS — D649 Anemia, unspecified: Secondary | ICD-10-CM

## 2012-06-03 DIAGNOSIS — D72829 Elevated white blood cell count, unspecified: Secondary | ICD-10-CM

## 2012-06-03 DIAGNOSIS — N183 Chronic kidney disease, stage 3 unspecified: Secondary | ICD-10-CM | POA: Diagnosis present

## 2012-06-03 HISTORY — DX: Chronic right heart failure: I50.812

## 2012-06-03 HISTORY — DX: Ischemic cardiomyopathy: I25.5

## 2012-06-03 LAB — URINALYSIS, ROUTINE W REFLEX MICROSCOPIC
Ketones, ur: NEGATIVE mg/dL
Leukocytes, UA: NEGATIVE
Nitrite: NEGATIVE
Protein, ur: NEGATIVE mg/dL
pH: 7.5 (ref 5.0–8.0)

## 2012-06-03 LAB — COMPREHENSIVE METABOLIC PANEL
Alkaline Phosphatase: 76 U/L (ref 39–117)
BUN: 88 mg/dL — ABNORMAL HIGH (ref 6–23)
Creatinine, Ser: 2.25 mg/dL — ABNORMAL HIGH (ref 0.50–1.35)
GFR calc Af Amer: 33 mL/min — ABNORMAL LOW (ref >90)
Glucose, Bld: 156 mg/dL — ABNORMAL HIGH (ref 70–99)
Potassium: 2.9 mEq/L — ABNORMAL LOW (ref 3.5–5.1)
Total Bilirubin: 0.9 mg/dL (ref 0.3–1.2)
Total Protein: 8.2 g/dL (ref 6.0–8.3)

## 2012-06-03 LAB — CBC WITH DIFFERENTIAL/PLATELET
Basophils Relative: 0 % (ref 0–1)
Eosinophils Absolute: 0 10*3/uL (ref 0.0–0.7)
HCT: 23 % — ABNORMAL LOW (ref 39.0–52.0)
Hemoglobin: 8.4 g/dL — ABNORMAL LOW (ref 13.0–17.0)
Lymphs Abs: 1.1 10*3/uL (ref 0.7–4.0)
MCH: 28.4 pg (ref 26.0–34.0)
MCHC: 36.5 g/dL — ABNORMAL HIGH (ref 30.0–36.0)
MCV: 77.7 fL — ABNORMAL LOW (ref 78.0–100.0)
Monocytes Absolute: 0.9 10*3/uL (ref 0.1–1.0)
Monocytes Relative: 7 % (ref 3–12)
Neutrophils Relative %: 84 % — ABNORMAL HIGH (ref 43–77)
RBC: 2.96 MIL/uL — ABNORMAL LOW (ref 4.22–5.81)

## 2012-06-03 LAB — TROPONIN I
Troponin I: <0.3 ng/mL (ref <0.30)
Troponin I: <0.3 ng/mL (ref <0.30)
Troponin I: <0.3 ng/mL (ref <0.30)

## 2012-06-03 LAB — BASIC METABOLIC PANEL
Chloride: 76 mEq/L — ABNORMAL LOW (ref 96–112)
GFR calc Af Amer: 36 mL/min — ABNORMAL LOW (ref >90)
GFR calc Af Amer: 39 mL/min — ABNORMAL LOW (ref >90)
GFR calc non Af Amer: 31 mL/min — ABNORMAL LOW (ref >90)
Glucose, Bld: 142 mg/dL — ABNORMAL HIGH (ref 70–99)
Potassium: 2.8 mEq/L — ABNORMAL LOW (ref 3.5–5.1)
Potassium: 3.8 mEq/L (ref 3.5–5.1)
Sodium: 115 mEq/L — CL (ref 135–145)

## 2012-06-03 LAB — PROTIME-INR
INR: 1.81 — ABNORMAL HIGH (ref 0.00–1.49)
Prothrombin Time: 20.3 seconds — ABNORMAL HIGH (ref 11.6–15.2)

## 2012-06-03 LAB — SODIUM, URINE, RANDOM: Sodium, Ur: 10 mEq/L

## 2012-06-03 LAB — GLUCOSE, CAPILLARY: Glucose-Capillary: 166 mg/dL — ABNORMAL HIGH (ref 70–99)

## 2012-06-03 LAB — TSH: TSH: 1.258 u[IU]/mL (ref 0.350–4.500)

## 2012-06-03 LAB — MRSA PCR SCREENING: MRSA by PCR: NEGATIVE

## 2012-06-03 MED ORDER — ACETAMINOPHEN 325 MG PO TABS
650.0000 mg | ORAL_TABLET | Freq: Four times a day (QID) | ORAL | Status: DC | PRN
  Administered 2012-06-04: 325 mg via ORAL
  Administered 2012-06-05 – 2012-06-07 (×2): 650 mg via ORAL
  Filled 2012-06-03: qty 1
  Filled 2012-06-03 (×2): qty 2

## 2012-06-03 MED ORDER — ONDANSETRON HCL 4 MG/2ML IJ SOLN: 4.0000 mg | Freq: Four times a day (QID) | INTRAMUSCULAR | Status: DC | PRN

## 2012-06-03 MED ORDER — INFLUENZA VIRUS VACC SPLIT PF IM SUSP
0.5000 mL | INTRAMUSCULAR | Status: AC
  Administered 2012-06-04: 0.5 mL via INTRAMUSCULAR
  Filled 2012-06-03: qty 0.5

## 2012-06-03 MED ORDER — OXYCODONE HCL 5 MG PO TABS
5.0000 mg | ORAL_TABLET | Freq: Three times a day (TID) | ORAL | Status: DC | PRN
  Administered 2012-06-03 – 2012-06-09 (×13): 5 mg via ORAL
  Filled 2012-06-03 (×14): qty 1

## 2012-06-03 MED ORDER — SODIUM CHLORIDE 0.9 % IJ SOLN
3.0000 mL | Freq: Two times a day (BID) | INTRAMUSCULAR | Status: DC
  Administered 2012-06-03 – 2012-06-05 (×5): 3 mL via INTRAVENOUS

## 2012-06-03 MED ORDER — POTASSIUM CHLORIDE 10 MEQ/100ML IV SOLN
10.0000 meq | INTRAVENOUS | Status: AC
  Administered 2012-06-03 (×3): 10 meq via INTRAVENOUS
  Filled 2012-06-03 (×2): qty 100

## 2012-06-03 MED ORDER — SIMVASTATIN 20 MG PO TABS
20.0000 mg | ORAL_TABLET | Freq: Every day | ORAL | Status: DC
  Administered 2012-06-03 – 2012-06-08 (×6): 20 mg via ORAL
  Filled 2012-06-03 (×7): qty 1

## 2012-06-03 MED ORDER — ONDANSETRON HCL 4 MG PO TABS: 4.0000 mg | ORAL_TABLET | Freq: Four times a day (QID) | ORAL | Status: DC | PRN

## 2012-06-03 MED ORDER — POTASSIUM CHLORIDE 10 MEQ/100ML IV SOLN
10.0000 meq | Freq: Once | INTRAVENOUS | Status: AC
  Administered 2012-06-03: 10 meq via INTRAVENOUS
  Filled 2012-06-03: qty 100

## 2012-06-03 MED ORDER — SODIUM CHLORIDE 0.9 % IV SOLN
INTRAVENOUS | Status: DC
  Administered 2012-06-03: 100 mL/h via INTRAVENOUS

## 2012-06-03 MED ORDER — POTASSIUM CHLORIDE CRYS ER 20 MEQ PO TBCR
40.0000 meq | EXTENDED_RELEASE_TABLET | Freq: Two times a day (BID) | ORAL | Status: AC
  Administered 2012-06-03 (×2): 40 meq via ORAL
  Filled 2012-06-03 (×2): qty 2

## 2012-06-03 MED ORDER — PANTOPRAZOLE SODIUM 40 MG PO TBEC
40.0000 mg | DELAYED_RELEASE_TABLET | Freq: Every day | ORAL | Status: DC
  Administered 2012-06-03: 40 mg via ORAL
  Filled 2012-06-03: qty 1

## 2012-06-03 MED ORDER — INSULIN ASPART 100 UNIT/ML ~~LOC~~ SOLN
0.0000 [IU] | Freq: Three times a day (TID) | SUBCUTANEOUS | Status: DC
  Administered 2012-06-03: 2 [IU] via SUBCUTANEOUS
  Administered 2012-06-04: 1 [IU] via SUBCUTANEOUS
  Administered 2012-06-04: 2 [IU] via SUBCUTANEOUS

## 2012-06-03 MED ORDER — POTASSIUM CHLORIDE 10 MEQ/100ML IV SOLN
INTRAVENOUS | Status: AC
  Administered 2012-06-03: 10 meq via INTRAVENOUS
  Filled 2012-06-03: qty 100

## 2012-06-03 NOTE — Progress Notes (Signed)
Pt had syncopal episode while sitting on side of bed.  Nurse at bedside and pt only out a few seconds.  States he feels okay after laying down.

## 2012-06-03 NOTE — ED Notes (Signed)
Per ems- pt comes from home. Had near syncopal episode this morning, has had dizziness, "light headedness" x 2 weeks, multiple falls from this. No neuro deficits. A x 4. CBG 173. BP 110/50, HR 84, RR 18.

## 2012-06-03 NOTE — H&P (Signed)
[facility] History and Physical  Steve Mccarty BMW:[id number] DOB: Oct 01, 1945 DOA: 06/03/2012   PCP: Thayer Headings, MD   Chief Complaint: dizziness and multiple falls over the last 2 weeks.  HPI: Steve Mccarty is a 66 y.o. male with h/o a fib on xeralto, recently discharged from the hospital for diverticular bleed was brought in for persistent dizziness and multiple falls associated with it. He denies any chest pain, sob. On arrival to ED, he was found to have profound hyponatremia, hypokalemic, with guiac positive stools. He was orthostatic, as his pulse rate increased on sitting up and he was dizzy and had a presyncopal episode. He is being admitted to hospitalist service for evaluation and management of severe, hyponatremia, hypokalemia, and acute on chronic renal failure in the setting of dehydration.    Review of Systems: The patient denies anorexia, fever, weight loss,, vision loss, decreased hearing, hoarseness, chest pain, syncope, dyspnea on exertion, peripheral edema, hemoptysis, abdominal pain, melena, hematochezia, severe indigestion/heartburn, hematuria, incontinence, genital sores, muscle weakness, suspicious skin lesions, transient blindness, difficulty walking, depression, unusual weight change, enlarged lymph nodes, angioedema, and breast masses.    Past Medical History  Diagnosis Date  . Atrial fibrillation   . Hypertension   . Atrial fibrillation   . Coronary artery disease   . CHF (congestive heart failure)     perpheral edema  . Chronic renal disease, stage III   . Joint pain   . Aortic valve insufficiency, rheumatic   . Diabetes mellitus     Was for 10 years, stopped Metformin 2010  . GERD (gastroesophageal reflux disease)   . Blood in stool    Past Surgical History  Procedure Date  . Tonsillectomy   . Cardiac catheterization   . Esophagogastroduodenoscopy 11/03/2011    Procedure: ESOPHAGOGASTRODUODENOSCOPY (EGD);  Surgeon: Theda Belfast, MD;   Location: [facility];  Service: Endoscopy;  Laterality: N/A;  . Colonoscopy 11/03/2011    Procedure: COLONOSCOPY;  Surgeon: Theda Belfast, MD;  Location: [facility];  Service: Endoscopy;  Laterality: N/A;  . Esophagogastroduodenoscopy 03/22/2012    Procedure: ESOPHAGOGASTRODUODENOSCOPY (EGD);  Surgeon: Theda Belfast, MD;  Location: [facility];  Service: Endoscopy;  Laterality: N/A;  . Esophagogastroduodenoscopy 04/04/2012    Procedure: ESOPHAGOGASTRODUODENOSCOPY (EGD);  Surgeon: Theda Belfast, MD;  Location: [facility];  Service: Endoscopy;  Laterality: N/A;   Social History:  reports that he has never smoked. He has never used smokeless tobacco. He reports that he does not drink alcohol or use illicit drugs.  where does patient live--home, by himself   No Known Allergies  No family history on file.  Prior to Admission medications   Medication Sig Start Date End Date Taking? Authorizing Provider  acetaminophen (TYLENOL) 325 MG tablet Take 1,300 mg by mouth 2 (two) times daily as needed. For pain   Yes Historical Provider, MD  aspirin EC 81 MG tablet Take 81 mg by mouth daily.     Yes Historical Provider, MD  cholecalciferol (VITAMIN D) 1000 UNITS tablet Take 1,000 Units by mouth daily.   Yes Historical Provider, MD  Magnesium Oxide (PHILLIPS) 500 MG (LAX) TABS Take 1 tablet by mouth as needed. Upset stomach   Yes Historical Provider, MD  metolazone (ZAROXOLYN) 2.5 MG tablet Take 2.5 mg by mouth daily.   Yes Historical Provider, MD  Multiple Vitamin (MULTIVITAMIN WITH MINERALS) TABS Take 1 tablet by mouth daily.   Yes Historical Provider, MD  omeprazole (PRILOSEC) 20 MG capsule Take 20 mg by  mouth daily.   Yes Historical Provider, MD  pravastatin (PRAVACHOL) 40 MG tablet Take 40 mg by mouth daily.   Yes Historical Provider, MD  rivaroxaban (XARELTO) 10 MG TABS tablet Take 10 mg by mouth daily.   Yes Historical Provider, MD  spironolactone (ALDACTONE) 25 MG tablet Take 25 mg by mouth  daily. Takes in pm   Yes Historical Provider, MD  spironolactone (ALDACTONE) 50 MG tablet Take 50 mg by mouth daily. Takes in am   Yes Historical Provider, MD  torsemide (DEMADEX) 100 MG tablet Take 0.5 tablets (50 mg total) by mouth daily. 03/22/12  Yes Dorothea Ogle, MD  traMADol (ULTRAM) 50 MG tablet Take 50 mg by mouth 2 (two) times daily as needed. pain   Yes Historical Provider, MD   Physical Exam: Filed Vitals:   06/03/12 0930 06/03/12 0942 06/03/12 0944 06/03/12 1045  BP: 124/67 112/55 122/84 115/46  Pulse: 85 88 110 70  Temp:      TempSrc:      Resp: 15   17  SpO2: 100%   100%    Constitutional: Vital signs reviewed.  Patient is a well-developed and well-nourished in no acute distress and cooperative with exam. Alert and oriented x3.  Head: Normocephalic and atraumatic Mouth: no erythema or exudates, dry MM Eyes: PERRL, EOMI, conjunctivae normal, No scleral icterus.  Neck: Supple, Trachea midline normal ROM, No JVD, mass, thyromegaly, or carotid bruit present.  Cardiovascular: RRR, S1 normal, S2 normal, no MRG, pulses symmetric and intact bilaterally Pulmonary/Chest: CTAB, no wheezes, rales, or rhonchi Abdominal: Soft. Non-tender, non-distended, bowel sounds are normal, no masses, organomegaly, or guarding present.  Musculoskeletal: No joint deformities, erythema, or stiffness, ROM full  Knee pain present.   Hematology: no cervical, inginal, or axillary adenopathy.  Neurological: A&O x3, Strength is normal and symmetric bilaterally, cranial nerve II-XII are grossly intact, no focal motor deficit, sensory intact to light touch bilaterally.  Skin: Warm, dry and intact. No rash, cyanosis, or clubbing.  Psychiatric: Normal mood and affect. speech and behavior is normal. Labs on Admission:  Basic Metabolic Panel:  Lab 06/03/12 1610  NA 114*  K 2.9*  CL 69*  CO2 24  GLUCOSE 156*  BUN 88*  CREATININE 2.25*  CALCIUM 9.9  MG --  PHOS --   Liver Function Tests:  Lab  06/03/12 0857  AST 29  ALT 13  ALKPHOS 76  BILITOT 0.9  PROT 8.2  ALBUMIN 4.1   No results found for this basename: LIPASE:5,AMYLASE:5 in the last 168 hours No results found for this basename: AMMONIA:5 in the last 168 hours CBC:  Lab 06/03/12 0857  WBC 12.4*  NEUTROABS 10.4*  HGB 8.4*  HCT 23.0*  MCV 77.7*  PLT 243   Cardiac Enzymes:  Lab 06/03/12 0857  CKTOTAL --  CKMB --  CKMBINDEX --  TROPONINI <0.30    BNP (last 3 results) No results found for this basename: PROBNP:3 in the last 8760 hours CBG: No results found for this basename: GLUCAP:5 in the last 168 hours  Radiological Exams on Admission: No results found.  EKG: sinus with diffuse ST depressions.   Assessment/Plan Active Problems: 1. Hyponatremia: probably secondary to dehydration. We will slowly bring up the sodium level and check BMP every 8 hours. He is currently symptomatic only by dizziness.  2. Hypokalemia: replete as needed. Check magnesium levels 3. Acute on Chronic renal failure: probably pre renal in origin. Hydrate him gently and repeat levels in am 4. Guiac  positive stools and recent GI bleed requiring blood transfusions: will stop the xeralto and continue with protonix for now. His H&H is stable from July, but he is dehydrated and i feel that hemoglobin is going to drop after he gets fluids.  5. Diabetes Mellitus: will start him on SSI while in the hospital.  6. Leukocytosis: he is afebrile, his UA is negative and CXR is pending. No indication for antibiotics.  7. Anemia;  Microcytic anemia. probably from microscopic bleeding from being on anticoagulation vs on going diverticular bleed. Will get anemia panel. 8. Atrial fibrillation : on xarelto which has been held. His rate is controlled. Called cardiology for further recommendations.  9. Hyperlipidemia: resume zocor . 10. DVT prophylaxis   Code Status: full code Family Communication: none at bedside Disposition Plan: possible home when  medically stable.  Time spent:62 min  Steve Mccarty [facility] Pager [phone]  If 7PM-7AM, please contact night-coverage [email or web address] Password [facility] 06/03/2012, 11:14 AM

## 2012-06-03 NOTE — Progress Notes (Signed)
PT Cancellation Note  Treatment cancelled today due to medical issues with patient which prohibited therapy. Patient had just passed out with nursing when PT arrived.  Will HOLD PT today.  Thanks.  INGOLD,Rakhi Romagnoli 06/03/2012, 3:55 PM  [facility] [phone] (pager)

## 2012-06-03 NOTE — Consult Note (Signed)
Reason for Consult: GI Bleed on xarelto, hyponatremia Referring Physician: Dr. [name] is an 66 y.o. male.    Chief Complaint:  Dizziness with multiple falls over last 2 weeks  HPI: 66 y.o. male with h/o a fib on xeralto, recently discharged from the hospital for diverticular bleed was brought in for persistent dizziness and multiple falls associated with it. He denies any chest pain, sob. On arrival to ED, he was found to have profound hyponatremia, hypokalemic, with guiac positive stools. He was orthostatic, as his pulse rate increased on sitting up and he was dizzy and had a presyncopal episode. He is being admitted to hospitalist service for evaluation and management of severe, hyponatremia, hypokalemia, and acute on chronic renal failure in the setting of dehydration.   Since arrival to 2900 he has had a syncopal episode, no arrhythmia. Abnormal labs with Na 115, K+2.8 Cr. 2.11  Pro bnp of 1543.  Recent GI bleed in July, probable diverticular bleed per Dr. Haywood Pao notes.  No record of paracentesis that was discussed.  But he did have improvement in his abd. Ascites.  He remained on Xarelto at discharge in July.  Other history with ICM EF 25% with chronic systolic HF and predominant Rt. Heart failure with ascities.  History of CAD from cardiac at some point with Dr. Aleen Campi but I have no records.    Last nuc study 11/28/11 with no significant ischemia. Low risk scan. Last Echo 11/28/11 EF <25% and suggestion of impaired LV relaxation, moderate to severe septal hypokinesis.  Severely dilated LA,  moderate mitral annular calcification mild MR, mild pulmonary HTN with RV systolic pressure 30-40 mmHg.   Mild to moderate aortic regurg.  Past Medical History  Diagnosis Date  . Atrial fibrillation   . Hypertension   . Atrial fibrillation   . Coronary artery disease   . CHF (congestive heart failure)     perpheral edema  . Chronic renal disease, stage III   . Joint pain   .  Aortic valve insufficiency, rheumatic   . Diabetes mellitus     Was for 10 years, stopped Metformin 2010  . GERD (gastroesophageal reflux disease)   . Blood in stool   . Cardiomyopathy, ischemic, EF 25% by echo 11/28/11. 06/03/2012    Past Surgical History  Procedure Date  . Tonsillectomy   . Cardiac catheterization   . Esophagogastroduodenoscopy 11/03/2011    Procedure: ESOPHAGOGASTRODUODENOSCOPY (EGD);  Surgeon: Theda Belfast, MD;  Location: [facility];  Service: Endoscopy;  Laterality: N/A;  . Colonoscopy 11/03/2011    Procedure: COLONOSCOPY;  Surgeon: Theda Belfast, MD;  Location: [facility];  Service: Endoscopy;  Laterality: N/A;  . Esophagogastroduodenoscopy 03/22/2012    Procedure: ESOPHAGOGASTRODUODENOSCOPY (EGD);  Surgeon: Theda Belfast, MD;  Location: [facility];  Service: Endoscopy;  Laterality: N/A;  . Esophagogastroduodenoscopy 04/04/2012    Procedure: ESOPHAGOGASTRODUODENOSCOPY (EGD);  Surgeon: Theda Belfast, MD;  Location: [facility];  Service: Endoscopy;  Laterality: N/A;    History reviewed. No pertinent family history. Social History:  reports that he has never smoked. He has never used smokeless tobacco. He reports that he does not drink alcohol or use illicit drugs.  Allergies: No Known Allergies  Medications Prior to Admission  Medication Sig Dispense Refill  . acetaminophen (TYLENOL) 325 MG tablet Take 1,300 mg by mouth 2 (two) times daily as needed. For pain      . aspirin EC 81 MG tablet Take 81 mg by mouth  daily.        . cholecalciferol (VITAMIN D) 1000 UNITS tablet Take 1,000 Units by mouth daily.      . Magnesium Oxide (PHILLIPS) 500 MG (LAX) TABS Take 1 tablet by mouth as needed. Upset stomach      . metolazone (ZAROXOLYN) 2.5 MG tablet Take 2.5 mg by mouth daily.      . Multiple Vitamin (MULTIVITAMIN WITH MINERALS) TABS Take 1 tablet by mouth daily.      Marland Kitchen omeprazole (PRILOSEC) 20 MG capsule Take 20 mg by mouth daily.      . pravastatin  (PRAVACHOL) 40 MG tablet Take 40 mg by mouth daily.      . rivaroxaban (XARELTO) 10 MG TABS tablet Take 10 mg by mouth daily.      Marland Kitchen spironolactone (ALDACTONE) 25 MG tablet Take 25 mg by mouth daily. Takes in pm      . spironolactone (ALDACTONE) 50 MG tablet Take 50 mg by mouth daily. Takes in am      . torsemide (DEMADEX) 100 MG tablet Take 0.5 tablets (50 mg total) by mouth daily.  30 tablet  0  . traMADol (ULTRAM) 50 MG tablet Take 50 mg by mouth 2 (two) times daily as needed. pain        Results for orders placed during the hospital encounter of 06/03/12 (from the past 48 hour(s))  CBC WITH DIFFERENTIAL     Status: Abnormal   Collection Time   06/03/12  8:57 AM      Component Value Range Comment   WBC 12.4 (*) 4.0 - 10.5 K/uL    RBC 2.96 (*) 4.22 - 5.81 MIL/uL    Hemoglobin 8.4 (*) 13.0 - 17.0 g/dL    HCT 16.1 (*) 09.6 - 52.0 %    MCV 77.7 (*) 78.0 - 100.0 fL    MCH 28.4  26.0 - 34.0 pg    MCHC 36.5 (*) 30.0 - 36.0 g/dL    RDW 04.5 (*) 40.9 - 15.5 %    Platelets 243  150 - 400 K/uL    Neutrophils Relative 84 (*) 43 - 77 %    Neutro Abs 10.4 (*) 1.7 - 7.7 K/uL    Lymphocytes Relative 9 (*) 12 - 46 %    Lymphs Abs 1.1  0.7 - 4.0 K/uL    Monocytes Relative 7  3 - 12 %    Monocytes Absolute 0.9  0.1 - 1.0 K/uL    Eosinophils Relative 0  0 - 5 %    Eosinophils Absolute 0.0  0.0 - 0.7 K/uL    Basophils Relative 0  0 - 1 %    Basophils Absolute 0.0  0.0 - 0.1 K/uL   COMPREHENSIVE METABOLIC PANEL     Status: Abnormal   Collection Time   06/03/12  8:57 AM      Component Value Range Comment   Sodium 114 (*) 135 - 145 mEq/L    Potassium 2.9 (*) 3.5 - 5.1 mEq/L    Chloride 69 (*) 96 - 112 mEq/L    CO2 24  19 - 32 mEq/L    Glucose, Bld 156 (*) 70 - 99 mg/dL    BUN 88 (*) 6 - 23 mg/dL    Creatinine, Ser 8.11 (*) 0.50 - 1.35 mg/dL    Calcium 9.9  8.4 - 91.4 mg/dL    Total Protein 8.2  6.0 - 8.3 g/dL    Albumin 4.1  3.5 - 5.2 g/dL  AST 29  0 - 37 U/L    ALT 13  0 - 53 U/L     Alkaline Phosphatase 76  39 - 117 U/L    Total Bilirubin 0.9  0.3 - 1.2 mg/dL    GFR calc non Af Amer 29 (*) >90 mL/min    GFR calc Af Amer 33 (*) >90 mL/min   TROPONIN I     Status: Normal   Collection Time   06/03/12  8:57 AM      Component Value Range Comment   Troponin I <0.30  <0.30 ng/mL   PROTIME-INR     Status: Abnormal   Collection Time   06/03/12  9:10 AM      Component Value Range Comment   Prothrombin Time 20.3 (*) 11.6 - 15.2 seconds    INR 1.81 (*) 0.00 - 1.49   URINALYSIS, ROUTINE W REFLEX MICROSCOPIC     Status: Normal   Collection Time   06/03/12 10:06 AM      Component Value Range Comment   Color, Urine YELLOW  YELLOW    APPearance CLEAR  CLEAR    Specific Gravity, Urine 1.009  1.005 - 1.030    pH 7.5  5.0 - 8.0    Glucose, UA NEGATIVE  NEGATIVE mg/dL    Hgb urine dipstick NEGATIVE  NEGATIVE    Bilirubin Urine NEGATIVE  NEGATIVE    Ketones, ur NEGATIVE  NEGATIVE mg/dL    Protein, ur NEGATIVE  NEGATIVE mg/dL    Urobilinogen, UA 0.2  0.0 - 1.0 mg/dL    Nitrite NEGATIVE  NEGATIVE    Leukocytes, UA NEGATIVE  NEGATIVE MICROSCOPIC NOT DONE ON URINES WITH NEGATIVE PROTEIN, BLOOD, LEUKOCYTES, NITRITE, OR GLUCOSE <1000 mg/dL.  OCCULT BLOOD, POC DEVICE     Status: Normal   Collection Time   06/03/12 10:11 AM      Component Value Range Comment   Fecal Occult Bld POSITIVE     BASIC METABOLIC PANEL     Status: Abnormal   Collection Time   06/03/12 12:41 PM      Component Value Range Comment   Sodium 115 (*) 135 - 145 mEq/L    Potassium 2.8 (*) 3.5 - 5.1 mEq/L    Chloride 71 (*) 96 - 112 mEq/L    CO2 27  19 - 32 mEq/L    Glucose, Bld 142 (*) 70 - 99 mg/dL    BUN 86 (*) 6 - 23 mg/dL    Creatinine, Ser 9.60 (*) 0.50 - 1.35 mg/dL    Calcium 9.4  8.4 - 45.4 mg/dL    GFR calc non Af Amer 31 (*) >90 mL/min    GFR calc Af Amer 36 (*) >90 mL/min   PRO B NATRIURETIC PEPTIDE     Status: Abnormal   Collection Time   06/03/12  2:33 PM      Component Value Range Comment   Pro B  Natriuretic peptide (BNP) 1543.0 (*) 0 - 125 pg/mL   TROPONIN I     Status: Normal   Collection Time   06/03/12  2:33 PM      Component Value Range Comment   Troponin I <0.30  <0.30 ng/mL    Dg Chest Port 1 View  06/03/2012  *RADIOLOGY REPORT*  Clinical Data: Syncope  PORTABLE CHEST - 1 VIEW  Comparison: 03/20/2012  Findings: The heart and pulmonary vascularity are within normal limits.  The lungs are clear bilaterally.  No acute bony abnormality is seen.  IMPRESSION: No acute intrathoracic abnormality.   Original Report Authenticated By: Phillips Odor, M.D.     ROS: General:no colds or fevers Skin:no rashes or ulcers HEENT:no blurred vision CV:no chest pain PUL:no SOB GI:no diarrhea + blood in his stools GU:no hematuria MS:no joint pain Neuro:+ falls, + syncope Endo:no diabetes   Blood pressure 95/52, pulse 82, temperature 98.8 F (37.1 C), temperature source Oral, resp. rate 16, SpO2 100.00%. PE: General: alert, oriented WM, NAD, but when sits up he has brief syncope Skin:Warm and dry brisk capillary refill HEENT:normocephalic, sclera clear Neck:supple, no JVD, no bruits Heart:S1S2 RRR II-III systolic murmur, more of a harsh squeak ant. But laterally the murmur looses the harsh quality Lungs:clear ant. Abd:No ascites, + BS, soft non tender Ext:no edema Neuro:alert and oriented. Follows commands    Assessment/Plan Principal Problem:  *Hyponatremia Active Problems:  Atrial fibrillation  Diabetes mellitus type 2 with complications  Leukocytosis  ARF (acute renal failure)  Anemia  Hypokalemia  Cardiomyopathy, ischemic, EF 25% by echo 11/28/11.  PLAN: Stop Xarelto, EKG with SR.  NS at 100/hr, still seems to be orthostatic.  On K+ 40 meq BID ---metolazone, aldactone, torsemide d/c'd.  Dr. Rennis Golden to see for further recommendations.  Babygirl Trager R 06/03/2012, 4:06 PM

## 2012-06-03 NOTE — ED Provider Notes (Signed)
History     CSN: [id number]  Arrival date & time 06/03/12  0807   First MD Initiated Contact with Patient 06/03/12 0818      Chief Complaint  Patient presents with  . Near Syncope    (Consider location/radiation/quality/duration/timing/severity/associated sxs/prior treatment) HPI Pt presents with lightheadedness when standing x 2 weeks and multiple falls without trauma. Pt states he was suppose to see his GI MD last week but was unable to make it out of the house. No fever, chills, urinary symptoms, N/V/D, CP, SOB. + dark stool for the last few days.   Past Medical History  Diagnosis Date  . Atrial fibrillation   . Hypertension   . Atrial fibrillation   . Coronary artery disease   . CHF (congestive heart failure)     perpheral edema  . Chronic renal disease, stage III   . Joint pain   . Aortic valve insufficiency, rheumatic   . Diabetes mellitus     Was for 10 years, stopped Metformin 2010  . GERD (gastroesophageal reflux disease)   . Blood in stool     Past Surgical History  Procedure Date  . Tonsillectomy   . Cardiac catheterization   . Esophagogastroduodenoscopy 11/03/2011    Procedure: ESOPHAGOGASTRODUODENOSCOPY (EGD);  Surgeon: Theda Belfast, MD;  Location: [facility];  Service: Endoscopy;  Laterality: N/A;  . Colonoscopy 11/03/2011    Procedure: COLONOSCOPY;  Surgeon: Theda Belfast, MD;  Location: [facility];  Service: Endoscopy;  Laterality: N/A;  . Esophagogastroduodenoscopy 03/22/2012    Procedure: ESOPHAGOGASTRODUODENOSCOPY (EGD);  Surgeon: Theda Belfast, MD;  Location: [facility];  Service: Endoscopy;  Laterality: N/A;  . Esophagogastroduodenoscopy 04/04/2012    Procedure: ESOPHAGOGASTRODUODENOSCOPY (EGD);  Surgeon: Theda Belfast, MD;  Location: [facility];  Service: Endoscopy;  Laterality: N/A;    No family history on file.  History  Substance Use Topics  . Smoking status: Never Smoker   . Smokeless tobacco: Never Used  . Alcohol Use: No   rarely      Review of Systems  Constitutional: Positive for fatigue. Negative for fever and chills.  HENT: Negative for neck pain and neck stiffness.   Respiratory: Negative for shortness of breath.   Cardiovascular: Negative for chest pain.  Gastrointestinal: Negative for nausea, vomiting, abdominal pain, diarrhea and constipation.  Skin: Negative for rash and wound.  Neurological: Positive for dizziness and light-headedness. Negative for syncope, numbness and headaches.    Allergies  Review of patient's allergies indicates no known allergies.  Home Medications   Current Outpatient Rx  Name Route Sig Dispense Refill  . ACETAMINOPHEN 325 MG PO TABS Oral Take 1,300 mg by mouth 2 (two) times daily as needed. For pain    . ASPIRIN EC 81 MG PO TBEC Oral Take 81 mg by mouth daily.      Marland Kitchen VITAMIN D 1000 UNITS PO TABS Oral Take 1,000 Units by mouth daily.    Marland Kitchen MAGNESIUM OXIDE 500 MG (LAX) PO TABS Oral Take 1 tablet by mouth as needed. Upset stomach    . METOLAZONE 2.5 MG PO TABS Oral Take 2.5 mg by mouth daily.    . ADULT MULTIVITAMIN W/MINERALS CH Oral Take 1 tablet by mouth daily.    Marland Kitchen OMEPRAZOLE 20 MG PO CPDR Oral Take 20 mg by mouth daily.    Marland Kitchen PRAVASTATIN SODIUM 40 MG PO TABS Oral Take 40 mg by mouth daily.    Marland Kitchen RIVAROXABAN 10 MG PO TABS Oral Take 10 mg by  mouth daily.    Marland Kitchen SPIRONOLACTONE 25 MG PO TABS Oral Take 25 mg by mouth daily. Takes in pm    . SPIRONOLACTONE 50 MG PO TABS Oral Take 50 mg by mouth daily. Takes in am    . TORSEMIDE 100 MG PO TABS Oral Take 0.5 tablets (50 mg total) by mouth daily. 30 tablet 0  . TRAMADOL HCL 50 MG PO TABS Oral Take 50 mg by mouth 2 (two) times daily as needed. pain      BP 122/84  Pulse 110  Temp 98.1 F (36.7 C) (Oral)  Resp 15  SpO2 100%  Physical Exam  Nursing note and vitals reviewed. Constitutional: He is oriented to person, place, and time. He appears well-developed and well-nourished. No distress.  HENT:  Head: Normocephalic  and atraumatic.  Mouth/Throat: Oropharynx is clear and moist.  Eyes: EOM are normal. Pupils are equal, round, and reactive to light.  Neck: Normal range of motion. Neck supple.       No posterior cervical TTP  Cardiovascular: Normal rate and regular rhythm.   Pulmonary/Chest: Effort normal and breath sounds normal. No respiratory distress. He has no wheezes. He has no rales.  Abdominal: Soft. Bowel sounds are normal. He exhibits no distension and no mass. There is no tenderness. There is no rebound and no guarding.  Genitourinary:       Dark stool on rectal exam  Musculoskeletal: Normal range of motion. He exhibits no edema and no tenderness.  Neurological: He is alert and oriented to person, place, and time.       5/5 motor, sensation intact  Skin: Skin is warm and dry. No rash noted. No erythema.  Psychiatric: He has a normal mood and affect. His behavior is normal.    ED Course  Procedures (including critical care time)  Labs Reviewed  CBC WITH DIFFERENTIAL - Abnormal; Notable for the following:    WBC 12.4 (*)     RBC 2.96 (*)     Hemoglobin 8.4 (*)     HCT 23.0 (*)     MCV 77.7 (*)     MCHC 36.5 (*)     RDW 16.2 (*)     Neutrophils Relative 84 (*)     Neutro Abs 10.4 (*)     Lymphocytes Relative 9 (*)     All other components within normal limits  COMPREHENSIVE METABOLIC PANEL - Abnormal; Notable for the following:    Sodium 114 (*)     Potassium 2.9 (*)     Chloride 69 (*)     Glucose, Bld 156 (*)     BUN 88 (*)     Creatinine, Ser 2.25 (*)     GFR calc non Af Amer 29 (*)     GFR calc Af Amer 33 (*)     All other components within normal limits  PROTIME-INR - Abnormal; Notable for the following:    Prothrombin Time 20.3 (*)     INR 1.81 (*)     All other components within normal limits  URINALYSIS, ROUTINE W REFLEX MICROSCOPIC  TROPONIN I  OCCULT BLOOD, POC DEVICE  OCCULT BLOOD X 1 CARD TO LAB, STOOL   No results found.   1. Hyponatremia   2. Hypokalemia    3. GI bleed       Date: 06/03/2012  Rate: 82  Rhythm: normal sinus rhythm  QRS Axis: normal  Intervals: normal  ST/T Wave abnormalities: nonspecific ST changes and nonspecific T wave  changes  Conduction Disutrbances:none  Narrative Interpretation:   Old EKG Reviewed: changes noted T waves appear consistent. Prev EKG 03/20/12 pt was in a fib  MDM  Will consult [facility] to admit for hyponatremia/hypokalemia   Discussed with [facility] who will see in ED     Loren Racer, MD 06/03/12 1041

## 2012-06-03 NOTE — ED Notes (Signed)
EDP made aware of sodium level

## 2012-06-03 NOTE — ED Notes (Signed)
Pt eating lunch, calling report at this time. Pt a x 4

## 2012-06-03 NOTE — ED Notes (Signed)
Pt reporting over past few weeks several near syncopal episodes. When standing becomes lightheaded and dizzy. Has had multiple falls, most recent being yesterday fell onto kitchen floor. C/o leg pain. Pt denying any cp or sob. No neuro deficits noted. Pt telling EMS has had bloody stools, has seen MD for this.

## 2012-06-03 NOTE — Consult Note (Signed)
Pt. Seen and examined. Agree with the NP/PA-C note as written.  Well known patient to me with significant right heart failure. Responded well recently to diuretics with significant weight loss and resolution of massive ascites. Will need to hold Xarelto until bleeding can be controlled. Diuretics being held .Marland Kitchen Will need to watch the weight closely as he has a tendency of developing heart failure quickly. Appears to be close to his dry weight. Will follow along with you. Correct electrolytes - would look at urine sodium to help determine etiology of the hyponatremia, most likely dehydration.  Chrystie Nose, MD, [facility] Attending Cardiologist [facility]

## 2012-06-03 NOTE — ED Notes (Signed)
Admitting MD at bedside.

## 2012-06-03 NOTE — ED Notes (Signed)
While doing orthostatic vital signs sitting, pt had near syncopal episode, became dizzy. Pt repositioned back in bed. EDP made aware

## 2012-06-04 DIAGNOSIS — D649 Anemia, unspecified: Secondary | ICD-10-CM

## 2012-06-04 DIAGNOSIS — E871 Hypo-osmolality and hyponatremia: Secondary | ICD-10-CM

## 2012-06-04 DIAGNOSIS — I2589 Other forms of chronic ischemic heart disease: Secondary | ICD-10-CM

## 2012-06-04 DIAGNOSIS — I4891 Unspecified atrial fibrillation: Secondary | ICD-10-CM

## 2012-06-04 LAB — CBC
HCT: 17.6 % — ABNORMAL LOW (ref 39.0–52.0)
HCT: 16.8 % — ABNORMAL LOW (ref 39.0–52.0)
MCHC: 35.2 g/dL (ref 30.0–36.0)
MCV: 79.6 fL (ref 78.0–100.0)
Platelets: 158 10*3/uL (ref 150–400)
RBC: 2.11 MIL/uL — ABNORMAL LOW (ref 4.22–5.81)
RDW: 16.4 % — ABNORMAL HIGH (ref 11.5–15.5)
WBC: 6.8 10*3/uL (ref 4.0–10.5)
WBC: 6.6 10*3/uL (ref 4.0–10.5)

## 2012-06-04 LAB — PROTIME-INR
INR: 1.2 (ref 0.00–1.49)
Prothrombin Time: 15 seconds (ref 11.6–15.2)

## 2012-06-04 LAB — GLUCOSE, CAPILLARY
Glucose-Capillary: 176 mg/dL — ABNORMAL HIGH (ref 70–99)
Glucose-Capillary: 122 mg/dL — ABNORMAL HIGH (ref 70–99)
Glucose-Capillary: 152 mg/dL — ABNORMAL HIGH (ref 70–99)

## 2012-06-04 LAB — BASIC METABOLIC PANEL WITH GFR
BUN: 75 mg/dL — ABNORMAL HIGH (ref 6–23)
CO2: 24 meq/L (ref 19–32)
Calcium: 8.8 mg/dL (ref 8.4–10.5)
Chloride: 82 meq/L — ABNORMAL LOW (ref 96–112)
Creatinine, Ser: 1.75 mg/dL — ABNORMAL HIGH (ref 0.50–1.35)
GFR calc Af Amer: 45 mL/min — ABNORMAL LOW
GFR calc non Af Amer: 39 mL/min — ABNORMAL LOW
Glucose, Bld: 116 mg/dL — ABNORMAL HIGH (ref 70–99)
Potassium: 3.6 meq/L (ref 3.5–5.1)
Sodium: 119 meq/L — CL (ref 135–145)

## 2012-06-04 LAB — TROPONIN I: Troponin I: <0.3 ng/mL

## 2012-06-04 LAB — HEMOGLOBIN AND HEMATOCRIT, BLOOD: HCT: 22.8 % — ABNORMAL LOW (ref 39.0–52.0)

## 2012-06-04 LAB — CORTISOL: Cortisol, Plasma: 10.2 ug/dL

## 2012-06-04 MED ORDER — SENNOSIDES-DOCUSATE SODIUM 8.6-50 MG PO TABS
2.0000 | ORAL_TABLET | Freq: Every day | ORAL | Status: DC
  Administered 2012-06-04 – 2012-06-08 (×5): 2 via ORAL
  Filled 2012-06-04: qty 2
  Filled 2012-06-04: qty 1
  Filled 2012-06-04 (×4): qty 2

## 2012-06-04 MED ORDER — SODIUM CHLORIDE 0.9 % IV SOLN
INTRAVENOUS | Status: DC
  Administered 2012-06-04: 11:00:00 via INTRAVENOUS
  Administered 2012-06-05: 100 mL/h via INTRAVENOUS
  Administered 2012-06-07: 01:00:00 via INTRAVENOUS

## 2012-06-04 MED ORDER — PANTOPRAZOLE SODIUM 40 MG PO TBEC
40.0000 mg | DELAYED_RELEASE_TABLET | Freq: Two times a day (BID) | ORAL | Status: DC
  Administered 2012-06-04 – 2012-06-09 (×11): 40 mg via ORAL
  Filled 2012-06-04 (×10): qty 1

## 2012-06-04 NOTE — Progress Notes (Signed)
[facility]  DAILY PROGRESS NOTE   Subjective:  No complaints. Sodium level is increasing to 119 today. Diuretics being held. Creatinine is improving. Low urine sodium. Renal function is improving.  Objective:  Temp:  [97.3 F (36.3 C)-98.8 F (37.1 C)] 98.6 F (37 C) (09/24 0722) Pulse Rate:  [70-110] 81  (09/24 0722) Resp:  [12-24] 17  (09/24 0700) BP: (88-139)/(32-84) 91/37 mmHg (09/24 0700) SpO2:  [97 %-100 %] 97 % (09/24 0722) Weight:  [66.5 kg (146 lb 9.7 oz)] 66.5 kg (146 lb 9.7 oz) (09/24 0400) Weight change:   Intake/Output from previous day: 09/23 0701 - 09/24 0700 In: 2635 [P.O.:840; I.V.:1495; IV Piggyback:300] Out: 3025 [Urine:3025]  Intake/Output from this shift:    Medications: Current Facility-Administered Medications  Medication Dose Route Frequency Provider Last Rate Last Dose  . 0.9 %  sodium chloride infusion   Intravenous Continuous Kathlen Mody, MD 100 mL/hr at 06/03/12 1900    . acetaminophen (TYLENOL) tablet 650 mg  650 mg Oral Q6H PRN Kathlen Mody, MD      . influenza  inactive virus vaccine (FLUZONE/FLUARIX) injection 0.5 mL  0.5 mL Intramuscular Tomorrow-1000 Kathlen Mody, MD      . insulin aspart (novoLOG) injection 0-9 Units  0-9 Units Subcutaneous TID WC Kathlen Mody, MD   2 Units at 06/03/12 1728  . ondansetron (ZOFRAN) tablet 4 mg  4 mg Oral Q6H PRN Kathlen Mody, MD       Or  . ondansetron (ZOFRAN) injection 4 mg  4 mg Intravenous Q6H PRN Kathlen Mody, MD      . oxyCODONE (Oxy IR/ROXICODONE) immediate release tablet 5 mg  5 mg Oral Q8H PRN Kathlen Mody, MD   5 mg at 06/03/12 2324  . pantoprazole (PROTONIX) EC tablet 40 mg  40 mg Oral Q1200 Kathlen Mody, MD   40 mg at 06/03/12 1729  . potassium chloride 10 mEq in 100 mL IVPB  10 mEq Intravenous Once Loren Racer, MD   10 mEq at 06/03/12 1027  . potassium chloride 10 mEq in 100 mL IVPB  10 mEq Intravenous Q1 Hr x 3 Kathlen Mody, MD   10 mEq at 06/03/12 1956  .  potassium chloride SA (K-DUR,KLOR-CON) CR tablet 40 mEq  40 mEq Oral BID Kathlen Mody, MD   40 mEq at 06/03/12 2100  . simvastatin (ZOCOR) tablet 20 mg  20 mg Oral q1800 Kathlen Mody, MD   20 mg at 06/03/12 1730  . sodium chloride 0.9 % injection 3 mL  3 mL Intravenous Q12H Kathlen Mody, MD   3 mL at 06/03/12 2100    Physical Exam: General appearance: alert and no distress Neck: no adenopathy, no carotid bruit, no JVD, supple, symmetrical, trachea midline and thyroid not enlarged, symmetric, no tenderness/mass/nodules Lungs: clear to auscultation bilaterally Heart: regular rate and rhythm and 2/6 systolic murmur at the RUSB Abdomen: soft, non-tender; bowel sounds normal; no masses,  no organomegaly Extremities: extremities normal, atraumatic, no cyanosis or edema Pulses: 2+ and symmetric  Lab Results: Results for orders placed during the hospital encounter of 06/03/12 (from the past 48 hour(s))  CBC WITH DIFFERENTIAL     Status: Abnormal   Collection Time   06/03/12  8:57 AM      Component Value Range Comment   WBC 12.4 (*) 4.0 - 10.5 K/uL    RBC 2.96 (*) 4.22 - 5.81 MIL/uL    Hemoglobin 8.4 (*) 13.0 - 17.0 g/dL    HCT  23.0 (*) 39.0 - 52.0 %    MCV 77.7 (*) 78.0 - 100.0 fL    MCH 28.4  26.0 - 34.0 pg    MCHC 36.5 (*) 30.0 - 36.0 g/dL    RDW 16.1 (*) 09.6 - 15.5 %    Platelets 243  150 - 400 K/uL    Neutrophils Relative 84 (*) 43 - 77 %    Neutro Abs 10.4 (*) 1.7 - 7.7 K/uL    Lymphocytes Relative 9 (*) 12 - 46 %    Lymphs Abs 1.1  0.7 - 4.0 K/uL    Monocytes Relative 7  3 - 12 %    Monocytes Absolute 0.9  0.1 - 1.0 K/uL    Eosinophils Relative 0  0 - 5 %    Eosinophils Absolute 0.0  0.0 - 0.7 K/uL    Basophils Relative 0  0 - 1 %    Basophils Absolute 0.0  0.0 - 0.1 K/uL   COMPREHENSIVE METABOLIC PANEL     Status: Abnormal   Collection Time   06/03/12  8:57 AM      Component Value Range Comment   Sodium 114 (*) 135 - 145 mEq/L    Potassium 2.9 (*) 3.5 - 5.1 mEq/L     Chloride 69 (*) 96 - 112 mEq/L    CO2 24  19 - 32 mEq/L    Glucose, Bld 156 (*) 70 - 99 mg/dL    BUN 88 (*) 6 - 23 mg/dL    Creatinine, Ser 0.45 (*) 0.50 - 1.35 mg/dL    Calcium 9.9  8.4 - 40.9 mg/dL    Total Protein 8.2  6.0 - 8.3 g/dL    Albumin 4.1  3.5 - 5.2 g/dL    AST 29  0 - 37 U/L    ALT 13  0 - 53 U/L    Alkaline Phosphatase 76  39 - 117 U/L    Total Bilirubin 0.9  0.3 - 1.2 mg/dL    GFR calc non Af Amer 29 (*) >90 mL/min    GFR calc Af Amer 33 (*) >90 mL/min   TROPONIN I     Status: Normal   Collection Time   06/03/12  8:57 AM      Component Value Range Comment   Troponin I <0.30  <0.30 ng/mL   PROTIME-INR     Status: Abnormal   Collection Time   06/03/12  9:10 AM      Component Value Range Comment   Prothrombin Time 20.3 (*) 11.6 - 15.2 seconds    INR 1.81 (*) 0.00 - 1.49   URINALYSIS, ROUTINE W REFLEX MICROSCOPIC     Status: Normal   Collection Time   06/03/12 10:06 AM      Component Value Range Comment   Color, Urine YELLOW  YELLOW    APPearance CLEAR  CLEAR    Specific Gravity, Urine 1.009  1.005 - 1.030    pH 7.5  5.0 - 8.0    Glucose, UA NEGATIVE  NEGATIVE mg/dL    Hgb urine dipstick NEGATIVE  NEGATIVE    Bilirubin Urine NEGATIVE  NEGATIVE    Ketones, ur NEGATIVE  NEGATIVE mg/dL    Protein, ur NEGATIVE  NEGATIVE mg/dL    Urobilinogen, UA 0.2  0.0 - 1.0 mg/dL    Nitrite NEGATIVE  NEGATIVE    Leukocytes, UA NEGATIVE  NEGATIVE MICROSCOPIC NOT DONE ON URINES WITH NEGATIVE PROTEIN, BLOOD, LEUKOCYTES, NITRITE, OR GLUCOSE <1000 mg/dL.  OCCULT  BLOOD, POC DEVICE     Status: Normal   Collection Time   06/03/12 10:11 AM      Component Value Range Comment   Fecal Occult Bld POSITIVE     BASIC METABOLIC PANEL     Status: Abnormal   Collection Time   06/03/12 12:41 PM      Component Value Range Comment   Sodium 115 (*) 135 - 145 mEq/L    Potassium 2.8 (*) 3.5 - 5.1 mEq/L    Chloride 71 (*) 96 - 112 mEq/L    CO2 27  19 - 32 mEq/L    Glucose, Bld 142 (*) 70 - 99 mg/dL     BUN 86 (*) 6 - 23 mg/dL    Creatinine, Ser 7.82 (*) 0.50 - 1.35 mg/dL    Calcium 9.4  8.4 - 95.6 mg/dL    GFR calc non Af Amer 31 (*) >90 mL/min    GFR calc Af Amer 36 (*) >90 mL/min   TSH     Status: Normal   Collection Time   06/03/12  1:54 PM      Component Value Range Comment   TSH 1.258  0.350 - 4.500 uIU/mL   PRO B NATRIURETIC PEPTIDE     Status: Abnormal   Collection Time   06/03/12  2:33 PM      Component Value Range Comment   Pro B Natriuretic peptide (BNP) 1543.0 (*) 0 - 125 pg/mL   TROPONIN I     Status: Normal   Collection Time   06/03/12  2:33 PM      Component Value Range Comment   Troponin I <0.30  <0.30 ng/mL   MRSA PCR SCREENING     Status: Normal   Collection Time   06/03/12  2:44 PM      Component Value Range Comment   MRSA by PCR NEGATIVE  NEGATIVE   GLUCOSE, CAPILLARY     Status: Abnormal   Collection Time   06/03/12  4:11 PM      Component Value Range Comment   Glucose-Capillary 166 (*) 70 - 99 mg/dL   CREATININE, URINE, RANDOM     Status: Normal   Collection Time   06/03/12  8:09 PM      Component Value Range Comment   Creatinine, Urine 30.54     SODIUM, URINE, RANDOM     Status: Normal   Collection Time   06/03/12  8:09 PM      Component Value Range Comment   Sodium, Ur 10     GLUCOSE, CAPILLARY     Status: Abnormal   Collection Time   06/03/12  9:17 PM      Component Value Range Comment   Glucose-Capillary 128 (*) 70 - 99 mg/dL    Comment 1 Notify RN      Comment 2 Documented in Chart     BASIC METABOLIC PANEL     Status: Abnormal   Collection Time   06/03/12  9:30 PM      Component Value Range Comment   Sodium 113 (*) 135 - 145 mEq/L    Potassium 3.8  3.5 - 5.1 mEq/L DELTA CHECK NOTED   Chloride 76 (*) 96 - 112 mEq/L    CO2 25  19 - 32 mEq/L    Glucose, Bld 119 (*) 70 - 99 mg/dL    BUN 81 (*) 6 - 23 mg/dL    Creatinine, Ser 2.13 (*) 0.50 - 1.35 mg/dL  Calcium 8.9  8.4 - 10.5 mg/dL    GFR calc non Af Amer 33 (*) >90 mL/min    GFR calc  Af Amer 39 (*) >90 mL/min   TROPONIN I     Status: Normal   Collection Time   06/03/12  9:30 PM      Component Value Range Comment   Troponin I <0.30  <0.30 ng/mL   TROPONIN I     Status: Normal   Collection Time   06/04/12  2:48 AM      Component Value Range Comment   Troponin I <0.30  <0.30 ng/mL   BASIC METABOLIC PANEL     Status: Abnormal   Collection Time   06/04/12  2:49 AM      Component Value Range Comment   Sodium 119 (*) 135 - 145 mEq/L    Potassium 3.6  3.5 - 5.1 mEq/L    Chloride 82 (*) 96 - 112 mEq/L    CO2 24  19 - 32 mEq/L    Glucose, Bld 116 (*) 70 - 99 mg/dL    BUN 75 (*) 6 - 23 mg/dL    Creatinine, Ser 7.82 (*) 0.50 - 1.35 mg/dL    Calcium 8.8  8.4 - 95.6 mg/dL    GFR calc non Af Amer 39 (*) >90 mL/min    GFR calc Af Amer 45 (*) >90 mL/min   CBC     Status: Abnormal   Collection Time   06/04/12  2:49 AM      Component Value Range Comment   WBC 6.8  4.0 - 10.5 K/uL    RBC 2.25 (*) 4.22 - 5.81 MIL/uL    Hemoglobin 6.2 (*) 13.0 - 17.0 g/dL    HCT 21.3 (*) 08.6 - 52.0 %    MCV 78.2  78.0 - 100.0 fL    MCH 27.6  26.0 - 34.0 pg    MCHC 35.2  30.0 - 36.0 g/dL    RDW 57.8 (*) 46.9 - 15.5 %    Platelets 158  150 - 400 K/uL DELTA CHECK NOTED  PROTIME-INR     Status: Normal   Collection Time   06/04/12  2:49 AM      Component Value Range Comment   Prothrombin Time 15.0  11.6 - 15.2 seconds    INR 1.20  0.00 - 1.49   GLUCOSE, CAPILLARY     Status: Abnormal   Collection Time   06/04/12  7:24 AM      Component Value Range Comment   Glucose-Capillary 103 (*) 70 - 99 mg/dL     Imaging: Dg Chest Port 1 View  06/03/2012  *RADIOLOGY REPORT*  Clinical Data: Syncope  PORTABLE CHEST - 1 VIEW  Comparison: 03/20/2012  Findings: The heart and pulmonary vascularity are within normal limits.  The lungs are clear bilaterally.  No acute bony abnormality is seen.  IMPRESSION: No acute intrathoracic abnormality.   Original Report Authenticated By: Phillips Odor, M.D.      Assessment:  1. Principal Problem: 2.  *Hyponatremia 3. Active Problems: 4.  Atrial fibrillation 5.  GI bleed 6.  Diabetes mellitus type 2 with complications 7.  Leukocytosis 8.  ARF (acute renal failure) 9.  Anemia 10.  Hypokalemia 11.  Cardiomyopathy, ischemic, EF 25% by echo 11/28/11. 12.   Plan:  1. Sodium is improving with saline. Urine sodium is low, therefore I suspect this is hypovolemic hyponatremia, probably related to aggressive diuresis. Creatinine is improving. Once sodium has  normalized and creatinine is at nadir, will need to re-establish lower maintenance diuretic doses.  Time Spent Directly with Patient:  15 minutes  Length of Stay:  LOS: 1 day   Chrystie Nose, MD, [facility] Attending Cardiologist [facility]  HILTY,Kenneth C 06/04/2012, 8:25 AM

## 2012-06-04 NOTE — Progress Notes (Signed)
MD paged multiple times to notify of critical lab values : Na 113, Na 119, & Hgb 6.2; will try again; pt's VSS;

## 2012-06-04 NOTE — Progress Notes (Signed)
OT Cancellation Note  Treatment cancelled today due to medical issues with patient which prohibited therapy.  Evern Bio 06/04/2012, 10:51 AM [phone]

## 2012-06-04 NOTE — Progress Notes (Signed)
PT Cancellation Note  Treatment cancelled today due to medical issues with patient which prohibited therapy.  Hgb too low.  MD asked to HOLD PT.  Thanks.   INGOLD,Jaysie Benthall 06/04/2012, 2:19 PM  [facility] [phone] (pager)

## 2012-06-04 NOTE — Progress Notes (Signed)
MD paged again about critical hgb of 6.2; waiting for response;

## 2012-06-04 NOTE — Progress Notes (Addendum)
[facility] PROGRESS NOTE  [name] ZOX:[id number] DOB: Feb 20, 1946 DOA: 06/03/2012 PCP: Thayer Headings, MD  Assessment/Plan: Principal Problem:  *Hyponatremia Likely secondary to volume loss- currently on NS with improvement noted. Will need to decrease NS during blood transfusions to prevent pulmonary edema. Once blood has transfused, can increase NS back to 100 cc/hr and follow sodium levels.   Active Problems:  Syncope Due to dehydration/ orthostasis- will cont to volume resuscitate and follow.    Atrial fibrillation Currently rate controlled- Xarelto discontinued by Cardiology.    GI bleed Due to Antral ulcers- will transfuse 2 units PRBC today and increase PPI to BID- recommend Protonix over Prilosec at least for next 1-2 months. No need for a repeat EGD at this point but we may want to repeat it prior to resuming Xarelto (in 8- 12 wks?) to ensure ulcers have healed.    ARF (acute renal failure) Cont to hydrate while watching for volume overload.    Anemia As mentioned above- transfuse 2 units of PRBC and follow.    Hypokalemia replaced   Cardiomyopathy, ischemic, EF 25% by echo 11/28/11 Discussed plan for transfusions with Dr Rennis Golden- monitor closely in SDU for fluid overload. Apparently, he was significantly volume overloaded with ascites as well as peripheral edema and Dr Rennis Golden was able to resolve this with diureses.   Thrombocytopenia Due to blood loss? Vs Xarelto? - Platelets were noted to be borderline low on 7/12 at 145- suspect the reading of 243 yesterday was due to hemoconcentration- will follow.   Code Status: Do not intubate- discussed with patient today.  Disposition Plan: follow in SDU DVT prophylaxis: SCDs   Brief narrative: Steve Mccarty is a 66 y.o. male with h/o a fib on xeralto, recently discharged from the hospital for diverticular bleed was brought in for persistent dizziness and multiple falls associated with it. He denies any chest  pain, sob. On arrival to ED, he was found to have profound hyponatremia, hypokalemic, with guiac positive stools. He was orthostatic, as his pulse rate increased on sitting up and he was dizzy and had a presyncopal episode. He is being admitted to hospitalist service for evaluation and management of severe, hyponatremia, hypokalemia, and acute on chronic renal failure in the setting of dehydration.    Consultants:  [facility]  Procedures:  none  Antibiotics:  None  HPI/Subjective: After a thorough conversation with the patient, I have learned that he had black stools about 3-4 weeks ago. He admits that these black stools lasted for about 4 days and have not recurred since then. He did not discuss these black stools with his physicians and was not aware of the significance. He does not admit to epigastric pain or GERD like symptoms. He also admits to severe constipation but did not use any Pepto-bismol prior to the onset of black stools.  He underwent 2 EGDs in July by Dr Elnoria Howard when he was initially found to be anemic- he was found to have antral erosions (records of EGD reviewed in EPIC) and recommended to be placed on BID PPI but was only prescribed Prilosec once a day.   Objective: Filed Vitals:   06/04/12 1130 06/04/12 1145 06/04/12 1200 06/04/12 1300  BP: 90/41 99/39 86/17  99/43  Pulse:  86    Temp:  98.3 F (36.8 C)    TempSrc:  Oral    Resp: 18 15 12 20   Height:      Weight:      SpO2:   98%  Intake/Output Summary (Last 24 hours) at 06/04/12 1455 Last data filed at 06/04/12 1300  Gross per 24 hour  Intake 4382.41 ml  Output   4400 ml  Net -17.59 ml    Exam:   General:  Alert, no acute distress  Cardiovascular: RRR, 2/6 murmur in mitral area radiating to axilla, 2/6 Murmurs in pulmonary and aortic areas as well.   Respiratory: CTA b/l  Abdomen: soft, NT, ND, BS+  Ext: no c/c/e  Data Reviewed: Basic Metabolic Panel:  Lab 06/04/12 0272 06/03/12 2130 06/03/12  1241 06/03/12 0857  NA 119* 113* 115* 114*  K 3.6 3.8 2.8* 2.9*  CL 82* 76* 71* 69*  CO2 24 25 27 24   GLUCOSE 116* 119* 142* 156*  BUN 75* 81* 86* 88*  CREATININE 1.75* 1.99* 2.11* 2.25*  CALCIUM 8.8 8.9 9.4 9.9  MG -- -- -- --  PHOS -- -- -- --   Liver Function Tests:  Lab 06/03/12 0857  AST 29  ALT 13  ALKPHOS 76  BILITOT 0.9  PROT 8.2  ALBUMIN 4.1   No results found for this basename: LIPASE:5,AMYLASE:5 in the last 168 hours No results found for this basename: AMMONIA:5 in the last 168 hours CBC:  Lab 06/04/12 0957 06/04/12 0249 06/03/12 0857  WBC 6.6 6.8 12.4*  NEUTROABS -- -- 10.4*  HGB 5.8* 6.2* 8.4*  HCT 16.8* 17.6* 23.0*  MCV 79.6 78.2 77.7*  PLT 129* 158 243   Cardiac Enzymes:  Lab 06/04/12 0248 06/03/12 2130 06/03/12 1433 06/03/12 0857  CKTOTAL -- -- -- --  CKMB -- -- -- --  CKMBINDEX -- -- -- --  TROPONINI <0.30 <0.30 <0.30 <0.30   BNP (last 3 results)  Basename 06/03/12 1433  PROBNP 1543.0*   CBG:  Lab 06/04/12 1154 06/04/12 0724 06/03/12 2117 06/03/12 1611  GLUCAP 176* 103* 128* 166*    Recent Results (from the past 240 hour(s))  MRSA PCR SCREENING     Status: Normal   Collection Time   06/03/12  2:44 PM      Component Value Range Status Comment   MRSA by PCR NEGATIVE  NEGATIVE Final      Studies: Dg Chest Port 1 View  06/03/2012  *RADIOLOGY REPORT*  Clinical Data: Syncope  PORTABLE CHEST - 1 VIEW  Comparison: 03/20/2012  Findings: The heart and pulmonary vascularity are within normal limits.  The lungs are clear bilaterally.  No acute bony abnormality is seen.  IMPRESSION: No acute intrathoracic abnormality.   Original Report Authenticated By: Phillips Odor, M.D.     Scheduled Meds:   . influenza  inactive virus vaccine  0.5 mL Intramuscular Tomorrow-1000  . insulin aspart  0-9 Units Subcutaneous TID WC  . pantoprazole  40 mg Oral BID AC  . potassium chloride  10 mEq Intravenous Q1 Hr x 3  . potassium chloride  40 mEq Oral BID  .  senna-docusate  2 tablet Oral QHS  . simvastatin  20 mg Oral q1800  . sodium chloride  3 mL Intravenous Q12H  . DISCONTD: pantoprazole  40 mg Oral Q1200   Continuous Infusions:   . sodium chloride 20 mL/hr at 06/04/12 1129  . DISCONTD: sodium chloride 100 mL/hr at 06/03/12 1900    ________________________________________________________________________  Time spent: 40 min    [facility]  [facility] Pager [phone] If 8PM-8AM, please contact night-coverage at [email or web address], password [facility] 06/04/2012, 2:55 PM  LOS: 1 day

## 2012-06-04 NOTE — Progress Notes (Addendum)
Shift event:  RN paged this Np 2/2 Na being 119. However, pt came in with hyponatremia and 119 is improvement over 113 on last check.  Also, Hgb 6.2 this a.m., but attending's note says Hgb likely to drop 2/2 fluid resuscitation given dehydration.  Ordered r/p H/H at 1000 this a.m. along with Type and screen.  Craige Cotta, NP

## 2012-06-05 ENCOUNTER — Encounter (HOSPITAL_COMMUNITY): Payer: Self-pay | Admitting: Cardiology

## 2012-06-05 DIAGNOSIS — K922 Gastrointestinal hemorrhage, unspecified: Secondary | ICD-10-CM

## 2012-06-05 DIAGNOSIS — I50812 Chronic right heart failure: Secondary | ICD-10-CM | POA: Diagnosis present

## 2012-06-05 LAB — BASIC METABOLIC PANEL
BUN: 43 mg/dL — ABNORMAL HIGH (ref 6–23)
BUN: 35 mg/dL — ABNORMAL HIGH (ref 6–23)
CO2: 22 mEq/L (ref 19–32)
CO2: 22 mEq/L (ref 19–32)
Calcium: 8.7 mg/dL (ref 8.4–10.5)
Chloride: 91 mEq/L — ABNORMAL LOW (ref 96–112)
Chloride: 92 mEq/L — ABNORMAL LOW (ref 96–112)
Creatinine, Ser: 1.36 mg/dL — ABNORMAL HIGH (ref 0.50–1.35)
Creatinine, Ser: 1.1 mg/dL (ref 0.50–1.35)
GFR calc Af Amer: 79 mL/min — ABNORMAL LOW (ref >90)
GFR calc non Af Amer: 68 mL/min — ABNORMAL LOW (ref >90)
Glucose, Bld: 91 mg/dL (ref 70–99)
Potassium: 3.3 mEq/L — ABNORMAL LOW (ref 3.5–5.1)
Potassium: 3.4 mEq/L — ABNORMAL LOW (ref 3.5–5.1)
Sodium: 126 mEq/L — ABNORMAL LOW (ref 135–145)

## 2012-06-05 LAB — CBC
HCT: 21.9 % — ABNORMAL LOW (ref 39.0–52.0)
Hemoglobin: 7.7 g/dL — ABNORMAL LOW (ref 13.0–17.0)
MCH: 28.3 pg (ref 26.0–34.0)
MCHC: 35.2 g/dL (ref 30.0–36.0)
MCV: 80.5 fL (ref 78.0–100.0)
Platelets: 115 10*3/uL — ABNORMAL LOW (ref 150–400)
RBC: 2.72 MIL/uL — ABNORMAL LOW (ref 4.22–5.81)
RDW: 15.7 % — ABNORMAL HIGH (ref 11.5–15.5)
WBC: 5.6 10*3/uL (ref 4.0–10.5)

## 2012-06-05 LAB — GLUCOSE, CAPILLARY
Glucose-Capillary: 89 mg/dL (ref 70–99)
Glucose-Capillary: 110 mg/dL — ABNORMAL HIGH (ref 70–99)
Glucose-Capillary: 100 mg/dL — ABNORMAL HIGH (ref 70–99)
Glucose-Capillary: 110 mg/dL — ABNORMAL HIGH (ref 70–99)

## 2012-06-05 LAB — PREPARE RBC (CROSSMATCH)

## 2012-06-05 MED ORDER — POTASSIUM CHLORIDE CRYS ER 20 MEQ PO TBCR
40.0000 meq | EXTENDED_RELEASE_TABLET | Freq: Once | ORAL | Status: AC
  Administered 2012-06-05: 40 meq via ORAL
  Filled 2012-06-05: qty 2

## 2012-06-05 NOTE — Progress Notes (Addendum)
[facility] Progress Note National TEAM 1 - Stepdown/ICU TEAM   THOMSON HERBERS ZOX:[id number] DOB: [birth year] DOA: 06/03/2012 PCP: Thayer Headings, MD  Brief narrative: 66 y.o. male with h/o a fib on xeralto, recently discharged from the hospital for diverticular bleed was brought in for persistent dizziness and multiple falls. He denied any chest pain, sob. On arrival to ED, he was found to have profound hyponatremia, hypokalemia, and guiac positive stools. He was orthostatic. He was admitted to hospitalist service for evaluation and management of severe hyponatremia, hypokalemia, and acute on chronic renal failure in the setting of dehydration.   Assessment/Plan:  Hyponatremia Felt to be hypovolemic hyponatremia - steadily improving with volume resuscitation  Hypokalemia Improving with replacement - continue to follow trend  Hypotension Due to dehydration as well as significant blood loss anemia - improving with correction of same  Syncope Due to dehydration and anemia  guiac positive stool / GI bleed Patient admits to black stools 3 or 4 weeks ago lasting 4 days  - old records however note that his history is often inaccurate and unreliable - he underwent an EGD in July of this year by Dr. Elnoria Howard at which time he was found to have antral ulcers - a colonoscopy was carried out in February of this year revealing colonic diverticulosis - it was suggested he be placed on twice a day PPI but the patient apparently went home on Prilosec once a day - status post 2 units packed red blood cells - given his recent extensive evaluation, and inadequate outpatient treatment in the face of ongoing use of anticoagulant there's likely little GI would have to offer at this time unless the patient continues to bleed during his stay  Acute blood loss anemia Status post 2 units packed red blood cells - patient reached a nadir of 5.8 - he experienced an appropriate expected improvement to 8.0 after  2 units of blood - with ongoing hydration he has drifted back down to 7.7 - at this point we will expect that his blood count will begin to stabilize - he will however require additional units of blood to assure that his hemoglobin remains greater than or equal to 8 (as is well established in the current literature) - should he continue to drift down a GI consult may be required in order to consider epi injection or clipping  Atrial fibrillation Rate is currently controlled - cardiology is following - clearly the patient is not a candidate for anticoagulation at this time  Acute renal failure Renal function has normalized with volume expansion  Ischemic cardiomyopathy with ejection fraction 25% via echocardiogram 11/27/2028 Cardiology reports that the patient has a tendency to develop volume overload very quickly  Thrombocytopenia Follow  Diabetes Reportedly has been off of medical therapy since 2010 - CBGs are currently reasonably well-controlled - we'll follow  Code Status: DO NOT INTUBATE Disposition Plan: Remain in step down unit until hemoglobin is proven to be stable  Consultants: [facility]  Procedures: none  Antibiotics: none  DVT prophylaxis: SCDs due to ? bleeding  HPI/Subjective: The patient denies chest pain fevers chills nausea or vomiting.  He reports he was able to stand today without dizziness.  He tells me that he had a dark stool this morning but it's not clear if this is accurate.   Objective: Blood pressure 117/52, pulse 96, temperature 97.8 F (36.6 C), temperature source Oral, resp. rate 16, height 5\' 10"  (1.778 m), weight 66.6 kg (146 lb 13.2 oz), SpO2  99.00%.  Intake/Output Summary (Last 24 hours) at 06/05/12 1446 Last data filed at 06/05/12 1400  Gross per 24 hour  Intake   4870 ml  Output   5675 ml  Net   -805 ml     Exam: General: No acute respiratory distress Lungs: Clear to auscultation bilaterally without wheezes or crackles Cardiovascular:  Regular rate with 2/6 holosystolic murmur Abdomen: Nontender, nondistended, soft, bowel sounds positive, no rebound, no ascites, no appreciable mass Extremities: No significant cyanosis, clubbing, or edema bilateral lower extremities  Data Reviewed: Basic Metabolic Panel:  Lab 06/05/12 4098 06/04/12 2308 06/04/12 0249 06/03/12 2130 06/03/12 1241  NA 126* 124* 119* 113* 115*  K 3.4* 3.3* 3.6 3.8 2.8*  CL 92* 91* 82* 76* 71*  CO2 22 22 24 25 27   GLUCOSE 91 112* 116* 119* 142*  BUN 35* 43* 75* 81* 86*  CREATININE 1.10 1.36* 1.75* 1.99* 2.11*  CALCIUM 8.7 8.7 8.8 8.9 9.4  MG -- -- -- -- --  PHOS -- -- -- -- --   Liver Function Tests:  Lab 06/03/12 0857  AST 29  ALT 13  ALKPHOS 76  BILITOT 0.9  PROT 8.2  ALBUMIN 4.1   CBC:  Lab 06/05/12 0700 06/04/12 2308 06/04/12 0957 06/04/12 0249 06/03/12 0857  WBC 5.6 -- 6.6 6.8 12.4*  NEUTROABS -- -- -- -- 10.4*  HGB 7.7* 8.0* 5.8* 6.2* 8.4*  HCT 21.9* 22.8* 16.8* 17.6* 23.0*  MCV 80.5 -- 79.6 78.2 77.7*  PLT 115* -- 129* 158 243   Cardiac Enzymes:  Lab 06/04/12 0248 06/03/12 2130 06/03/12 1433 06/03/12 0857  CKTOTAL -- -- -- --  CKMB -- -- -- --  CKMBINDEX -- -- -- --  TROPONINI <0.30 <0.30 <0.30 <0.30   CBG:  Lab 06/05/12 1132 06/05/12 0820 06/04/12 2140 06/04/12 1703 06/04/12 1154  GLUCAP 110* 89 152* 122* 176*    Recent Results (from the past 240 hour(s))  MRSA PCR SCREENING     Status: Normal   Collection Time   06/03/12  2:44 PM      Component Value Range Status Comment   MRSA by PCR NEGATIVE  NEGATIVE Final      Studies:  Recent x-ray studies have been reviewed in detail by the Attending Physician  Scheduled Meds:  Reviewed in detail by the Attending Physician   Lonia Blood, MD [facility] Office  [phone] Pager [phone]  On-Call/Text Page:      [email or web address]      password [email or web address]  If 7PM-7AM, please contact night-coverage [email or web address] Password [email or web address] 06/05/2012, 2:46 PM   LOS: 2  days

## 2012-06-05 NOTE — Evaluation (Signed)
Occupational Therapy Evaluation Patient Details Name: Steve Mccarty MRN: [id number] DOB: 1946/08/13 Today's Date: 06/05/2012 Time: 4098-1191 OT Time Calculation (min): 29 min  OT Assessment / Plan / Recommendation Clinical Impression  Pt admitted with dizziness and fall with hyponatremia, hypokalemia, and acute on chronic renal failure in the setting of dehydration and anemia. Pt is determined to return home.  Will follow acutely to address the below deficit areas.     OT Assessment  Patient needs continued OT Services    Follow Up Recommendations  Home health OT;Supervision - Intermittent    Barriers to Discharge      Equipment Recommendations  Other (comment) (will determine tub equipment needs next visit)    Recommendations for Other Services    Frequency  Min 2X/week    Precautions / Restrictions Precautions Precautions: Fall Restrictions Weight Bearing Restrictions: No   Pertinent Vitals/Pain     ADL  Eating/Feeding: Performed;Set up (difficulty opening packaging) Where Assessed - Eating/Feeding: Bed level Grooming: Simulated;Wash/dry face;Set up Where Assessed - Grooming: Unsupported sitting Upper Body Bathing: Simulated;Minimal assistance Where Assessed - Upper Body Bathing: Unsupported sitting Lower Body Bathing: Simulated;Minimal assistance Where Assessed - Lower Body Bathing: Supported sit to stand Upper Body Dressing: Simulated;Set up Where Assessed - Upper Body Dressing: Unsupported sitting Lower Body Dressing: Performed;Minimal assistance Where Assessed - Lower Body Dressing: Supported sit to stand Toilet Transfer: Simulated;Minimal assistance Toilet Transfer Method: Surveyor, minerals: Materials engineer and Hygiene: Performed;Supervision/safety Where Assessed - Engineer, mining and Hygiene: Standing Equipment Used: Gait belt    OT Diagnosis: Generalized weakness  OT Problem List:  Decreased range of motion;Decreased activity tolerance;Impaired balance (sitting and/or standing);Decreased knowledge of use of DME or AE;Impaired UE functional use OT Treatment Interventions: Self-care/ADL training;DME and/or AE instruction;Patient/family education   OT Goals Acute Rehab OT Goals OT Goal Formulation: With patient Time For Goal Achievement: 06/19/12 Potential to Achieve Goals: Good ADL Goals Pt Will Perform Grooming: with modified independence;Standing at sink ADL Goal: Grooming - Progress: Goal set today Pt Will Perform Upper Body Bathing: with modified independence;Sitting at sink ADL Goal: Upper Body Bathing - Progress: Goal set today Pt Will Perform Lower Body Bathing: with modified independence;Sitting at sink;Standing at sink ADL Goal: Lower Body Bathing - Progress: Goal set today Pt Will Perform Upper Body Dressing: with modified independence;Sitting, chair ADL Goal: Upper Body Dressing - Progress: Goal set today Pt Will Perform Lower Body Dressing: with modified independence;Sit to stand from chair ADL Goal: Lower Body Dressing - Progress: Goal set today Pt Will Transfer to Toilet: with modified independence;Ambulation;with DME;Regular height toilet ADL Goal: Toilet Transfer - Progress: Goal set today Pt Will Perform Toileting - Clothing Manipulation: with modified independence;Standing ADL Goal: Toileting - Clothing Manipulation - Progress: Goal set today Pt Will Perform Tub/Shower Transfer: with modified independence;Transfer tub bench;Ambulation;Shower seat with back ADL Goal: Tub/Shower Transfer - Progress: Goal set today  Visit Information  Last OT Received On: 06/05/12 Assistance Needed: +1    Subjective Data  Subjective: "I was using a desk chair to get around after I fell." Patient Stated Goal: Return independently.   Prior Functioning     Home Living Lives With: Alone Available Help at Discharge: Friend(s);Available PRN/intermittently Type of  Home: House Home Access: Stairs to enter [employer] of Steps: 3 Entrance Stairs-Rails: None Home Layout: One level Bathroom Shower/Tub: Engineer, manufacturing systems: Standard Home Adaptive Equipment: Walker - rolling;Straight cane Additional Comments: Pt would like to explore  tub DME options Prior Function Level of Independence: Independent with assistive device(s) Able to Take Stairs?: Yes Driving: Yes Communication Communication: No difficulties Dominant Hand: Right         Vision/Perception     Cognition  Overall Cognitive Status: Appears within functional limits for tasks assessed/performed Arousal/Alertness: Awake/alert Orientation Level: Appears intact for tasks assessed Behavior During Session: [facility] for tasks performed    Extremity/Trunk Assessment Right Upper Extremity Assessment RUE ROM/Strength/Tone: Deficits (Pt has Dupuytren's contractures) Left Upper Extremity Assessment LUE ROM/Strength/Tone: Deficits (pt has Dupuytren's contractures)     Mobility Bed Mobility Bed Mobility: Supine to Sit;Sit to Supine;Sitting - Scoot to Edge of Bed Supine to Sit: 4: Min assist Sitting - Scoot to Edge of Bed: 5: Supervision Sit to Supine: 4: Min assist Transfers Transfers: Sit to Stand;Stand to Sit Sit to Stand: 4: Min assist;From bed;From chair/3-in-1;With upper extremity assist Stand to Sit: 5: Supervision;With upper extremity assist;To chair/3-in-1;To bed     Shoulder Instructions     Exercise     Balance     End of Session OT - End of Session Activity Tolerance: Patient tolerated treatment well Patient left: in bed;with call bell/phone within reach  GO     Evern Bio 06/05/2012, 3:18 PM [phone]

## 2012-06-05 NOTE — Progress Notes (Signed)
Pharmacist Heart Failure Core Measure Documentation  Assessment: Steve Mccarty has an EF documented as 25% on 11/28/11 by Echo.  Rationale: Heart failure patients with left ventricular systolic dysfunction (LVSD) and an EF < 40% should be prescribed an angiotensin converting enzyme inhibitor (ACEI) or angiotensin receptor blocker (ARB) at discharge unless a contraindication is documented in the medical record.  This patient is not currently on an ACEI or ARB for HF.  This note is being placed in the record in order to provide documentation that a contraindication to the use of these agents is present for this encounter.  ACE Inhibitor or Angiotensin Receptor Blocker is contraindicated (specify all that apply)  []   ACEI allergy AND ARB allergy []   Angioedema []   Moderate or severe aortic stenosis []   Hyperkalemia [x]   Hypotension []   Renal artery stenosis []   Worsening renal function, preexisting renal disease or dysfunction  [name], Pharm D 06/05/2012 3:49 PM

## 2012-06-05 NOTE — Progress Notes (Signed)
Physical Therapy Evaluation Patient Details Name: Steve Mccarty MRN: [id number] DOB: 12-25-1945 Today's Date: 06/05/2012 Time: 7829-5621 PT Time Calculation (min): 32 min  PT Assessment / Plan / Recommendation Clinical Impression  66 yo male admitted with dizziness, hyponatremia, hypokalemia, with recent drop in Hgb; Presents to PT with decr activity tol; Will benefit form acute PT to maximize independence and safety with mobility, activity tol to enable safe dc home    PT Assessment  Patient needs continued PT services    Follow Up Recommendations  Home health PT;Supervision - Intermittent    Barriers to Discharge Decreased caregiver support Lives alone, but will likely improve in functional status to be independent    Equipment Recommendations  Other (comment) (will determine tub equipment )    Recommendations for Other Services     Frequency Min 3X/week    Precautions / Restrictions Precautions Precautions: Fall Precaution Comments: watch BP closely with position change Restrictions Weight Bearing Restrictions: No   Pertinent Vitals/Pain No pain noted Position BP HR   Supine 101/29   Supine (post alt SLRs) 95/43   Sitting EOB 93/33 97  Sitting EOB (post 2-3 minutes) 87/39   Standing 81/64 112  Sitting in chair 88/44 96  Reclined in chair 103/42   End of session (sitting on BSC) 117/52    Pt cued to self-monitor for dizziness throughout above activity; He was asymptomatic      Mobility  Bed Mobility Bed Mobility: Supine to Sit;Sit to Supine;Sitting - Scoot to Edge of Bed Supine to Sit: 4: Min assist Sitting - Scoot to [place] of Bed: 5: Supervision Sit to Supine: 4: Min assist Details for Bed Mobility Assistance: Cues to self-monitor for dizziness throughout activity; Min assist , handheld assist for initial elevation of trunk from bed Transfers Transfers: Sit to Stand;Stand to Sit Sit to Stand: 4: Min guard;From bed;From chair/3-in-1;With upper extremity  assist Stand to Sit: 4: Min guard;To chair/3-in-1;With armrests Details for Transfer Assistance: Overall moving well; Cues to self-monitor for activity tol, especially during upright activity Ambulation/Gait Ambulation/Gait Assistance: 4: Min guard (without physical contact) Ambulation Distance (Feet): 4 Feet (Pivotal steps bed to recliner to [facility]) Assistive device: Rolling walker Ambulation/Gait Assistance Details: continued cues for self-monitor, but overall managing stepping well    Shoulder Instructions     Exercises     PT Diagnosis: Difficulty walking;Other (comment) (decr activity tolerance)  PT Problem List: Decreased activity tolerance;Cardiopulmonary status limiting activity PT Treatment Interventions: DME instruction;Gait training;Stair training;Functional mobility training;Therapeutic activities;Therapeutic exercise;Patient/family education   PT Goals Acute Rehab PT Goals PT Goal Formulation: With patient Time For Goal Achievement: 06/05/12 Potential to Achieve Goals: Good Pt will go Supine/Side to Sit: Independently PT Goal: Supine/Side to Sit - Progress: Goal set today Pt will go Sit to Supine/Side: Independently PT Goal: Sit to Supine/Side - Progress: Goal set today Pt will go Sit to Stand: Independently PT Goal: Sit to Stand - Progress: Goal set today Pt will go Stand to Sit: Independently PT Goal: Stand to Sit - Progress: Goal set today Pt will Ambulate: 1 - 15 feet;>150 feet;with modified independence;with least restrictive assistive device PT Goal: Ambulate - Progress: Goal set today Pt will Go Up / Down Stairs: 3-5 stairs;with modified independence;with least restrictive assistive device PT Goal: Up/Down Stairs - Progress: Goal set today  Visit Information  Last PT Received On: 06/05/12 Assistance Needed: +1    Subjective Data  Subjective: Agreeable to OOB Patient Stated Goal: Home   Prior Functioning  Home Living Lives With: Alone Available Help at  Discharge: Friend(s);Available PRN/intermittently Type of Home: House Home Access: Stairs to enter [employer] of Steps: 3 Entrance Stairs-Rails: None Home Layout: One level Bathroom Shower/Tub: Engineer, manufacturing systems: Standard Home Adaptive Equipment: Environmental consultant - rolling;Straight cane Additional Comments: Pt would like to explore tub DME options Prior Function Level of Independence: Independent with assistive device(s) Able to Take Stairs?: Yes Driving: Yes Communication Communication: No difficulties Dominant Hand: Right    Cognition  Overall Cognitive Status: Appears within functional limits for tasks assessed/performed Arousal/Alertness: Awake/alert Orientation Level: Appears intact for tasks assessed Behavior During Session: [facility] for tasks performed    Extremity/Trunk Assessment Right Upper Extremity Assessment RUE ROM/Strength/Tone:  (See OT eval) Left Upper Extremity Assessment LUE ROM/Strength/Tone:  (See OT eval) Right Lower Extremity Assessment RLE ROM/Strength/Tone: WFL for tasks assessed Left Lower Extremity Assessment LLE ROM/Strength/Tone: WFL for tasks assessed Trunk Assessment Trunk Assessment: Normal   Balance    End of Session PT - End of Session Equipment Utilized During Treatment: Gait belt Activity Tolerance: Patient tolerated treatment well;Other (comment) (While BP was low during activity, pt was asymptomatic) Patient left: with call bell/phone within reach (on [facility] next to recliner; RN made aware) Nurse Communication: Mobility status (that pt was on [facility])  GP     Marylu Lund [facility] [place], [place] [phone]  06/05/2012, 3:58 PM

## 2012-06-05 NOTE — Progress Notes (Addendum)
[facility] DAILY PROGRESS NOTE  NAME:  Steve Mccarty   MRN: [id number] DOB:  1946-06-19   ADMIT DATE: 06/03/2012   Patient Description   66 y.o. male with PMH below: presented with dizziness & weakness along with GI Bleed.  Found to be hyponatremic & anemic,(hypokalemic) with Acute renal failure (pre-renal due to dehydration & acute blood loss anemia).  HypoNa determined to be "hypovolemic"  - perhaps due to aggressive diuresis of edema/ascites in OP setting that happened to coincide with GI bleed.  Well known patient to our service (discussed his case with Dr. Rennis Golden 9/24).  significant right heart failure. Responded well recently to diuretics with significant weight loss and resolution of massive ascite --> but appears to have been "overdiuresed".   Past Medical History  Diagnosis Date  . Atrial fibrillation   . Hypertension   . Atrial fibrillation   . Coronary artery disease   . CHF (congestive heart failure)     perpheral edema  . Chronic renal disease, stage III   . Joint pain   . Aortic valve insufficiency, rheumatic   . Diabetes mellitus     Was for 10 years, stopped Metformin 2010  . GERD (gastroesophageal reflux disease)   . Blood in stool   . Cardiomyopathy, ischemic, EF 25% by echo 11/28/11. 06/03/2012  . Chronic right-sided HF (heart failure)     ascites   Clinical Course:  Improving slowly with Na levels up from 113 to 126; 2 U PRBC with ~1 point Hgb increase (suggesting more significant anemia).  Xarelto held.  Length of Stay:  LOS: 2 days    Subjective:   Today Steve Mccarty feels better.  Needs to have a BM. No SOB & no swelling.  Objective:  Temp:  [97.5 F (36.4 C)-98.4 F (36.9 C)] 98 F (36.7 C) (09/25 0800) Pulse Rate:  [76-89] 89  (09/25 0800) Resp:  [11-21] 21  (09/25 0900) BP: (63-110)/(17-50) 70/50 mmHg (09/25 0900) SpO2:  [97 %-99 %] 98 % (09/25 0800) Weight:  [66.6 kg (146 lb 13.2 oz)] 66.6 kg (146 lb 13.2 oz)  (09/25 0700) Weight change: 0.1 kg (3.5 oz) Physical Exam: General appearance: alert, cooperative, no distress, pale and normal mood & affect Neck: no adenopathy, no carotid bruit, no JVD and supple, symmetrical, trachea midline Lungs: clear to auscultation bilaterally and normal percussion bilaterally Heart: regular rate and rhythm, S1, S2 normal and systolic murmur: systolic ejection 2/6, crescendo and decrescendo at 2nd right intercostal space Abdomen: soft, non-tender; bowel sounds normal; no masses,  no organomegaly Extremities: extremities normal, atraumatic, no cyanosis or edema Pulses: 2+ and symmetric Neurologic: Grossly normal  Intake/Output from previous day: 09/24 0701 - 09/25 0700 In: 5324.9 [P.O.:2860; I.V.:1758.7; Blood:606.3] Out: 6600 [Urine:6600]  Intake/Output Summary (Last 24 hours) at 06/05/12 0931 Last data filed at 06/05/12 0900  Gross per 24 hour  Intake 5369.91 ml  Output   6200 ml  Net -830.09 ml    Results for orders placed during the hospital encounter of 06/03/12 (from the past 24 hour(s))  CBC     Status: Abnormal   Collection Time   06/04/12  9:57 AM      Component Value Range   WBC 6.6  4.0 - 10.5 K/uL   RBC 2.11 (*) 4.22 - 5.81 MIL/uL   Hemoglobin 5.8 (*) 13.0 - 17.0 g/dL   HCT 56.2 (*) 13.0 - 86.5 %   Platelets 129 (*) 150 - 400 K/uL  PREPARE RBC (CROSSMATCH)     Status: Normal   Collection Time   06/04/12 11:00 AM      Component Value Range   Order Confirmation ORDER PROCESSED BY BLOOD BANK    HEMOGLOBIN AND HEMATOCRIT, BLOOD     Status: Abnormal   Collection Time   06/04/12 11:08 PM      Component Value Range   Hemoglobin 8.0 (*) 13.0 - 17.0 g/dL   HCT 16.1 (*) 09.6 - 04.5 %  BASIC METABOLIC PANEL     Status: Abnormal   Collection Time   06/04/12 11:08 PM      Component Value Range   Sodium 124 (*) 135 - 145 mEq/L   Potassium 3.3 (*) 3.5 - 5.1 mEq/L   Chloride 91 (*) 96 - 112 mEq/L   CO2 22  19 - 32 mEq/L   Glucose, Bld 112 (*) 70  - 99 mg/dL   BUN 43 (*) 6 - 23 mg/dL   Creatinine, Ser 4.09 (*) 0.50 - 1.35 mg/dL  BASIC METABOLIC PANEL     Status: Abnormal   Collection Time   06/05/12  7:00 AM      Component Value Range   Sodium 126 (*) 135 - 145 mEq/L   Potassium 3.4 (*) 3.5 - 5.1 mEq/L   Chloride 92 (*) 96 - 112 mEq/L   CO2 22  19 - 32 mEq/L   Glucose, Bld 91  70 - 99 mg/dL   BUN 35 (*) 6 - 23 mg/dL   Creatinine, Ser 8.11  0.50 - 1.35 mg/dL   Calcium 8.7  8.4 - 91.4 mg/dL  CBC     Status: Abnormal   Collection Time   06/05/12  7:00 AM      Component Value Range   WBC 5.6  4.0 - 10.5 K/uL   RBC 2.72 (*) 4.22 - 5.81 MIL/uL   Hemoglobin 7.7 (*) 13.0 - 17.0 g/dL   HCT 78.2 (*) 95.6 - 21.3 %   MCV 80.5  78.0 - 100.0 fL   MCH 28.3  26.0 - 34.0 pg   MCHC 35.2  30.0 - 36.0 g/dL   RDW 08.6 (*) 57.8 - 46.9 %   Platelets 115 (*) 150 - 400 K/uL  GLUCOSE, CAPILLARY     Status: Normal   Collection Time   06/05/12  8:20 AM      Component Value Range   Glucose-Capillary 89  70 - 99 mg/dL    Imaging: CXR 6/29: no acute abnormality, no Pulmonary Vascular congestion.  MAR Reviewed  Assessment/Plan:  Principal Problem:  *Hyponatremia - hypovolemic Active Problems:  GI bleed  ARF (acute renal failure) - Pre-renal  Cardiomyopathy, ischemic, EF 25% by echo 11/28/11.  Chronic right-sided HF (heart failure) - with ascites  Paroxysmal atrial fibrillation  Diabetes mellitus type 2 with complications  Anemia  Hypokalemia  Leukocytosis  HypoNatremia continues to improve with Na up to 126 with NS infusion.  (NS held with PRBC to avoid volume overload).  Hgb level seems a bit too low for "dilutional anemia".  Hgb pre-transfusion down to 5.8 --> up to 8.0 after 2 Units PRBC.  With his low EF & RHF, may want to shoot for > 9.0 especially in light of hypotension.  Renal function seems to be improving with hydration & blood - c/s pre-renal ATN.  Continue to watch the weight closely as he has a tendency of developing heart  failure quickly. Appears to be close to his dry weight.  He is not on any HF medications with his hypotension & was only on high dose Torsemide/Aldactone + statin at home.  Would probably re-start at ~1/2 home regimen once fluid / electrolyte & Hgb level stable.    Will follow along with you.  Would not restart anticoagulation until bleeding source has been investigated / treated. ?? GI consultation.  Thrombocytopenia - as expected level is a bit lower after PRBC - DDx includes Xarelto, congestive hepatopathy/splenomegaly & dilution, etc.  BP still a bit low - so would keep in SDU for today, but he should be ok for OOB to chair & BS commode. = will order BS commode & OOB-chair.  Marykay Lex, M.D., M.S. [facility] [address] [place], [place]  [address]  [phone] Pager # [phone]  06/05/2012 9:45 AM   Time Spent Directly with Patient:  20 minutes

## 2012-06-06 DIAGNOSIS — N179 Acute kidney failure, unspecified: Secondary | ICD-10-CM

## 2012-06-06 LAB — TYPE AND SCREEN
ABO/RH(D): O POS
Antibody Screen: NEGATIVE
Unit division: 0

## 2012-06-06 LAB — CBC
HCT: 25.4 % — ABNORMAL LOW (ref 39.0–52.0)
MCHC: 34.3 g/dL (ref 30.0–36.0)
Platelets: 100 10*3/uL — ABNORMAL LOW (ref 150–400)
RBC: 3.19 MIL/uL — ABNORMAL LOW (ref 4.22–5.81)
RDW: 15.9 % — ABNORMAL HIGH (ref 11.5–15.5)
RDW: 15.9 % — ABNORMAL HIGH (ref 11.5–15.5)
WBC: 5 10*3/uL (ref 4.0–10.5)
WBC: 4.7 10*3/uL (ref 4.0–10.5)

## 2012-06-06 LAB — BASIC METABOLIC PANEL
Chloride: 95 mEq/L — ABNORMAL LOW (ref 96–112)
GFR calc Af Amer: 89 mL/min — ABNORMAL LOW (ref >90)
GFR calc non Af Amer: 76 mL/min — ABNORMAL LOW (ref >90)
Potassium: 3.9 mEq/L (ref 3.5–5.1)
Sodium: 128 mEq/L — ABNORMAL LOW (ref 135–145)

## 2012-06-06 LAB — GLUCOSE, CAPILLARY
Glucose-Capillary: 76 mg/dL (ref 70–99)
Glucose-Capillary: 77 mg/dL (ref 70–99)
Glucose-Capillary: 91 mg/dL (ref 70–99)

## 2012-06-06 NOTE — Care Management Note (Signed)
    Page 1 of 2   06/06/2012     11:39:12 AM   CARE MANAGEMENT NOTE 06/06/2012  Patient:  Steve Mccarty, Steve Mccarty   Account Number:  [id number]  Date Initiated:  06/03/2012  Documentation initiated by:  Junius Creamer  Subjective/Objective Assessment:   adm w syncope     Action/Plan:   lives alone, pcp dr Shary Decamp   Anticipated DC Date:     Anticipated DC Plan:        DC Planning Services  CM consult      PAC Choice  DURABLE MEDICAL EQUIPMENT   Choice offered to / List presented to:  C-1 Patient   DME arranged  TUB BENCH      DME agency  [facility]     [facility] arranged  HH - 11 Patient Refused      Status of service:   Medicare Important Message given?   (If response is "NO", the following Medicare IM given date fields will be blank) Date Medicare IM given:   Date Additional Medicare IM given:    Discharge Disposition:  HOME/SELF CARE  Per UR Regulation:  Reviewed for med. necessity/level of care/duration of stay  If discussed at Long Length of Stay Meetings, dates discussed:    Comments:  9/26 11:32a debbie Jos Cygan rn,bsn spoke w pt and gave pt [facility] agency list. phy ther had rec hhpt. pt refueses [facility]. he would like tub bench which i have ordered for pt. he has medicaid which he thinks will cover bench. [facility] darian at [facility] for tub bench.  9/25 14:21p debbie Tiffany Talarico rn,bsn await phy ther eval.  9/23 14:30p debbie Jaquae Rieves rn,bsn [phone]

## 2012-06-06 NOTE — Progress Notes (Signed)
Occupational Therapy Treatment Patient Details Name: Steve Mccarty MRN: [id number] DOB: [birth year] Today's Date: 06/06/2012 Time: 5621-3086 OT Time Calculation (min): 41 min  OT Assessment / Plan / Recommendation Comments on Treatment Session Pt is progressing in mobility.  Needs close supervision due to labile BP.  Pt was asymptomatic with hypotension.  Pt needs a tub transfer bench for d/c.    Follow Up Recommendations  Home health OT;Supervision - Intermittent    Barriers to Discharge       Equipment Recommendations  Tub/shower bench    Recommendations for Other Services    Frequency Min 2X/week   Plan Discharge plan remains appropriate    Precautions / Restrictions Precautions Precautions: Fall Precaution Comments: watch BP closely with position change Restrictions Weight Bearing Restrictions: No   Pertinent Vitals/Pain     ADL  Grooming: Performed;Wash/dry face;Supervision/safety Where Assessed - Grooming: Unsupported standing Toilet Transfer: Performed;Min guard Statistician Method: Sit to Barista: Regular height toilet Toileting - Clothing Manipulation and Hygiene: Performed;Supervision/safety Where Assessed - Engineer, mining and Hygiene: Standing Tub/Shower Transfer: Performed;Min guard Web designer Method: Science writer: Counsellor Used: Gait belt Transfers/Ambulation Related to ADLs: min guard assist for safety due to labile BP primarily ADL Comments: Pt shown tub bench and tub seat, choosing tub bench, has Medicaid so will order from hospital.    OT Diagnosis:    OT Problem List:   OT Treatment Interventions:     OT Goals ADL Goals Pt Will Perform Grooming: with modified independence;Standing at sink ADL Goal: Grooming - Progress: Progressing toward goals Pt Will Perform Upper Body Bathing: with modified independence;Sitting at sink Pt Will Perform Lower  Body Bathing: with modified independence;Sitting at sink;Standing at sink Pt Will Perform Upper Body Dressing: with modified independence;Sitting, chair Pt Will Perform Lower Body Dressing: with modified independence;Sit to stand from chair Pt Will Transfer to Toilet: with modified independence;Ambulation;with DME;Regular height toilet ADL Goal: Toilet Transfer - Progress: Progressing toward goals Pt Will Perform Toileting - Clothing Manipulation: with modified independence;Standing ADL Goal: Toileting - Clothing Manipulation - Progress: Progressing toward goals Pt Will Perform Tub/Shower Transfer: with modified independence;Transfer tub bench;Ambulation;Shower seat with back ADL Goal: Web designer - Progress: Progressing toward goals  Visit Information  Last OT Received On: 06/06/12 Assistance Needed: +1    Subjective Data      Prior Functioning       Cognition  Overall Cognitive Status: Appears within functional limits for tasks assessed/performed Arousal/Alertness: Awake/alert Orientation Level: Appears intact for tasks assessed Behavior During Session: [facility] for tasks performed    Mobility  Shoulder Instructions Bed Mobility Bed Mobility: Not assessed Supine to Sit: 6: Modified independent (Device/Increase time);HOB elevated;With rails Sitting - Scoot to Edge of Bed: 6: Modified independent (Device/Increase time) Details for Bed Mobility Assistance: No dizziness reported with positional changes.  Use of rail needed.   Transfers Transfers: Sit to Stand;Stand to Sit Sit to Stand: 5: Supervision;With upper extremity assist;From chair/3-in-1;From toilet Stand to Sit: 5: Supervision;With upper extremity assist;To chair/3-in-1 Details for Transfer Assistance: cues for safe hand placement & use of UE's to control descent with sitting       Exercises    Balance     End of Session OT - End of Session Patient left: in chair;with call bell/phone within reach  GO      Evern Bio 06/06/2012, 10:57 AM [phone]

## 2012-06-06 NOTE — Progress Notes (Signed)
Orthostatic VS:  Lying: HR-84, BP- 100/40 (55)  Sitting: HR-95, BP-87/32 (46)  Standing: HR-95, BP 88/37 (50)

## 2012-06-06 NOTE — Progress Notes (Signed)
Physical Therapy Treatment Patient Details Name: Steve Mccarty MRN: [id number] DOB: 11-Jun-1946 Today's Date: 06/06/2012 Time: 6269-4854 PT Time Calculation (min): 32 min  PT Assessment / Plan / Recommendation Comments on Treatment Session  Pt progressing well with mobility & PT goals.   Trialed ambulation with RW vs pushing IV pole with 1 UE.  No unsteadiness noted.  Pt with BP drop with positional changes, but asymptomatic.  Pt does report he was symptomatic with falls at home.    **Pt states he would like a tub/shower bench/seat for home. Will allow OT to determine need**     Follow Up Recommendations  Home health PT;Supervision - Intermittent    Barriers to Discharge        Equipment Recommendations   (will determine tub equipment)    Recommendations for Other Services    Frequency Min 3X/week   Plan Discharge plan remains appropriate    Precautions / Restrictions Precautions Precautions: Fall Precaution Comments: watch BP closely with position change Restrictions Weight Bearing Restrictions: No   Pertinent Vitals/Pain   Supine 97/38 (map 53)  Sitting 110/38 (map 57)  Standing 83/32 (map 46)  Sitting in recliner after ambulation 109/48( map 58)      Mobility  Bed Mobility Bed Mobility: Supine to Sit;Sitting - Scoot to Edge of Bed Supine to Sit: 6: Modified independent (Device/Increase time);HOB elevated;With rails Sitting - Scoot to Edge of Bed: 6: Modified independent (Device/Increase time) Details for Bed Mobility Assistance: No dizziness reported with positional changes.  Use of rail needed.   Transfers Transfers: Sit to Stand;Stand to Sit Sit to Stand: 5: Supervision;With upper extremity assist;From bed Stand to Sit: 5: Supervision;With upper extremity assist;With armrests;To chair/3-in-1 Details for Transfer Assistance: cues for safe hand placement & use of UE's to control descent with sitting Ambulation/Gait Ambulation/Gait Assistance: 4: Min  guard Ambulation Distance (Feet): 100 Feet Assistive device: Rolling walker (IV pole) Ambulation/Gait Assistance Details: Guarding for safety.  Initially, used RW then progressed to unilateral UE support by pushing IV pole.  Pt with slow steady gait & decreased stride but pt reports this is "normal" for him.  +2 to follow with recliner due to low BP, but no dizziness reported.   Gait Pattern: Step-through pattern;Decreased stride length Gait velocity: decreased Stairs: No Wheelchair Mobility Wheelchair Mobility: No    Exercises General Exercises - Lower Extremity Ankle Circles/Pumps: AROM;Both;20 reps Heel Slides: AROM;Both;15 reps Straight Leg Raises: AROM;Both;15 reps     PT Goals Acute Rehab PT Goals Time For Goal Achievement: 06/05/12 Potential to Achieve Goals: Good Pt will go Supine/Side to Sit: Independently PT Goal: Supine/Side to Sit - Progress: Progressing toward goal Pt will go Sit to Supine/Side: Independently Pt will go Sit to Stand: Independently PT Goal: Sit to Stand - Progress: Progressing toward goal Pt will go Stand to Sit: Independently PT Goal: Stand to Sit - Progress: Progressing toward goal Pt will Ambulate: 1 - 15 feet;>150 feet;with modified independence;with least restrictive assistive device PT Goal: Ambulate - Progress: Progressing toward goal Pt will Go Up / Down Stairs: 3-5 stairs;with modified independence;with least restrictive assistive device  Visit Information  Last PT Received On: 06/06/12 Assistance Needed: +1    Subjective Data  Patient Stated Goal: Home   Cognition  Overall Cognitive Status: Appears within functional limits for tasks assessed/performed Arousal/Alertness: Awake/alert Orientation Level: Appears intact for tasks assessed Behavior During Session: [facility] for tasks performed    Balance     End of Session PT - End  of Session Equipment Utilized During Treatment: Gait belt Activity Tolerance: Patient tolerated treatment  well Patient left: in chair;with call bell/phone within reach     [place], [place] [phone] 06/06/2012

## 2012-06-06 NOTE — Progress Notes (Addendum)
[facility] Progress Note [place] TEAM 1 - Stepdown/ICU TEAM   Steve Mccarty:[id number] DOB: [birth year] DOA: 06/03/2012 PCP: Thayer Headings, MD  Brief narrative: 66 y.o. male with h/o a fib on xeralto, recently discharged from the hospital for diverticular bleed was brought in for persistent dizziness and multiple falls. He denied any chest pain, sob. On arrival to ED, he was found to have profound hyponatremia, hypokalemia, and guiac positive stools. He was orthostatic. He was admitted to hospitalist service for evaluation and management of severe hyponatremia, hypokalemia, and acute on chronic renal failure in the setting of dehydration.   Assessment/Plan:  Hyponatremia Felt to be hypovolemic hyponatremia - steadily improving with volume resuscitation  Hypokalemia Improving with replacement - continue to follow trend  Hypotension Due to dehydration as well as significant blood loss anemia - as he still has positive orthostatic vitals, will cont to hydrate him slowly for today.   Syncope Due to dehydration and anemia  guiac positive stool / GI bleed Patient admits to black stools 3 or 4 weeks ago lasting 4 days - he underwent an EGD in July of this year by Dr. Elnoria Howard at which time he was found to have antral ulcers - a colonoscopy was carried out in February of this year revealing colonic diverticulosis - it was suggested he be placed on twice a day PPI but the patient apparently went home on Prilosec once a day - status post 2 units packed red blood cells - given his recent extensive evaluation, and inadequate outpatient treatment in the face of ongoing use of anticoagulant there's likely little GI would have to offer at this time unless the patient continues to bleed during his stay At this point, it is difficult to tell if he is bleeding. He states his stools are black but his hemoglobin is actually rising instead of decreasing. I will cont BID Protonix for now and follow  clinically.  Acute blood loss anemia Status post 2 units packed red blood cells - patient reached a nadir of 5.8 - he experienced an appropriate expected improvement to 8.0 after 2 units of blood - with ongoing hydration he has drifted back down to 7.7 - however after discontinuing hydration, hemoglobin is now climbing. Will cont to follow carefully.   Atrial fibrillation Rate is currently controlled - cardiology is following - clearly the patient is not a candidate for anticoagulation at this time  Acute renal failure Renal function has normalized with volume expansion  Ischemic cardiomyopathy with ejection fraction 25% via echocardiogram 11/27/2028 Cont to follow for fluid overload with hydration.  Thrombocytopenia Follow  Diabetes Reportedly has been off of medical therapy since 2010 - CBGs are currently reasonably well-controlled - we'll follow  Code Status: DO NOT INTUBATE Disposition Plan: Remain in step down unit  Consultants: [facility]  Procedures: none  Antibiotics: none  DVT prophylaxis: SCDs due to ? bleeding  HPI/Subjective: The patient continues to endorse dark stools. The RN from today has not seen his stool and therefore we are not able to confirm if this is melana. He does not have any complaints other than wanting his diet advanced.   Objective: Blood pressure 98/47, pulse 83, temperature 97.9 F (36.6 C), temperature source Oral, resp. rate 15, height 5\' 10"  (1.778 m), weight 66.6 kg (146 lb 13.2 oz), SpO2 98.00%.  Intake/Output Summary (Last 24 hours) at 06/06/12 1748 Last data filed at 06/06/12 1700  Gross per 24 hour  Intake   2455 ml  Output  4475 ml  Net  -2020 ml     Exam: General: No acute respiratory distress Lungs: Clear to auscultation bilaterally without wheezes or crackles Cardiovascular: Regular rate with 2/6 holosystolic murmur Abdomen: Nontender, nondistended, soft, bowel sounds positive, no rebound, no ascites, no appreciable  mass Extremities: No significant cyanosis, clubbing, or edema bilateral lower extremities  Data Reviewed: Basic Metabolic Panel:  Lab 06/06/12 1610 06/05/12 0700 06/04/12 2308 06/04/12 0249 06/03/12 2130  NA 128* 126* 124* 119* 113*  K 3.9 3.4* 3.3* 3.6 3.8  CL 95* 92* 91* 82* 76*  CO2 22 22 22 24 25   GLUCOSE 76 91 112* 116* 119*  BUN 24* 35* 43* 75* 81*  CREATININE 1.00 1.10 1.36* 1.75* 1.99*  CALCIUM 9.0 8.7 8.7 8.8 8.9  MG -- -- -- -- --  PHOS -- -- -- -- --   Liver Function Tests:  Lab 06/03/12 0857  AST 29  ALT 13  ALKPHOS 76  BILITOT 0.9  PROT 8.2  ALBUMIN 4.1   CBC:  Lab 06/06/12 1259 06/06/12 0448 06/05/12 0700 06/04/12 2308 06/04/12 0957 06/04/12 0249 06/03/12 0857  WBC 4.7 5.0 5.6 -- 6.6 6.8 --  NEUTROABS -- -- -- -- -- -- 10.4*  HGB 9.1* 8.7* 7.7* 8.0* 5.8* -- --  HCT 25.8* 25.4* 21.9* 22.8* 16.8* -- --  MCV 80.9 80.9 80.5 -- 79.6 78.2 --  PLT 100* 104* 115* -- 129* 158 --   Cardiac Enzymes:  Lab 06/04/12 0248 06/03/12 2130 06/03/12 1433 06/03/12 0857  CKTOTAL -- -- -- --  CKMB -- -- -- --  CKMBINDEX -- -- -- --  TROPONINI <0.30 <0.30 <0.30 <0.30   CBG:  Lab 06/06/12 1711 06/06/12 1132 06/06/12 0800 06/05/12 2128 06/05/12 1635  GLUCAP 76 77 76 110* 100*    Recent Results (from the past 240 hour(s))  MRSA PCR SCREENING     Status: Normal   Collection Time   06/03/12  2:44 PM      Component Value Range Status Comment   MRSA by PCR NEGATIVE  NEGATIVE Final      Studies:  Recent x-ray studies have been reviewed in detail by the Attending Physician  Scheduled Meds:  Reviewed in detail by the Attending Physician   Calvert Cantor, MD [phone]  If 7PM-7AM, please contact night-coverage [email or web address] Password [email or web address] 06/06/2012, 5:48 PM   LOS: 3 days

## 2012-06-06 NOTE — Progress Notes (Signed)
[facility]  66 y.o. male with PMH below: presented with dizziness & weakness along with GI Bleed. Found to be hyponatremic & anemic,(hypokalemic) with Acute renal failure (pre-renal due to dehydration & acute blood loss anemia). HypoNa determined to be "hypovolemic" - perhaps due to aggressive diuresis of edema/ascites in OP setting that happened to coincide with GI bleed.   Well known patient to our service (discussed his case with Dr. Rennis Golden 9/24). significant right heart failure. Responded well recently to diuretics with significant weight loss and resolution of massive ascite --> but appears to have been "overdiuresed".   Subjective: No dizziness, CP, or SOB  Objective: Vital signs in last 24 hours: Temp:  [97.6 F (36.4 C)-98.9 F (37.2 C)] 98.3 F (36.8 C) (09/26 0759) Pulse Rate:  [78-96] 83  (09/25 1927) Resp:  [14-23] 20  (09/26 0800) BP: (70-117)/(21-52) 92/42 mmHg (09/26 0800) SpO2:  [98 %-100 %] 100 % (09/26 0800) Weight:  [66.6 kg (146 lb 13.2 oz)] 66.6 kg (146 lb 13.2 oz) (09/26 0500) Last BM Date: 06/05/12  Intake/Output from previous day: 09/25 0701 - 09/26 0700 In: 3245 [P.O.:1935; I.V.:960; Blood:350] Out: 4125 [Urine:4125] Intake/Output this shift: Total I/O In: 10 [I.V.:10] Out: 150 [Urine:150]  Medications Current Facility-Administered Medications  Medication Dose Route Frequency Provider Last Rate Last Dose  . 0.9 %  sodium chloride infusion   Intravenous Continuous Lonia Blood, MD 10 mL/hr at 06/05/12 2000    . acetaminophen (TYLENOL) tablet 650 mg  650 mg Oral Q6H PRN Kathlen Mody, MD   650 mg at 06/05/12 0759  . insulin aspart (novoLOG) injection 0-9 Units  0-9 Units Subcutaneous TID WC Kathlen Mody, MD   1 Units at 06/04/12 1741  . ondansetron (ZOFRAN) tablet 4 mg  4 mg Oral Q6H PRN Kathlen Mody, MD       Or  . ondansetron (ZOFRAN) injection 4 mg  4 mg Intravenous Q6H PRN Kathlen Mody, MD      . oxyCODONE (Oxy  IR/ROXICODONE) immediate release tablet 5 mg  5 mg Oral Q8H PRN Kathlen Mody, MD   5 mg at 06/05/12 2320  . pantoprazole (PROTONIX) EC tablet 40 mg  40 mg Oral BID AC Calvert Cantor, MD   40 mg at 06/05/12 1700  . potassium chloride SA (K-DUR,KLOR-CON) CR tablet 40 mEq  40 mEq Oral Once Lonia Blood, MD   40 mEq at 06/05/12 1828  . senna-docusate (Senokot-S) tablet 2 tablet  2 tablet Oral QHS Calvert Cantor, MD   2 tablet at 06/05/12 2129  . simvastatin (ZOCOR) tablet 20 mg  20 mg Oral q1800 Kathlen Mody, MD   20 mg at 06/05/12 1828  . DISCONTD: sodium chloride 0.9 % injection 3 mL  3 mL Intravenous Q12H Kathlen Mody, MD   3 mL at 06/05/12 1000    PE: General appearance: alert, cooperative and no distress Lungs: clear to auscultation bilaterally Heart: regular rate and rhythm and 2/6 sys MM, loudest at [place]. Abdomen: +BS Extremities: No LEE Pulses: 2+ and symmetric Skin: warm and dry Neurologic: Grossly normal  Lab Results:   Basename 06/06/12 0448 06/05/12 0700 06/04/12 2308 06/04/12 0957  WBC 5.0 5.6 -- 6.6  HGB 8.7* 7.7* 8.0* --  HCT 25.4* 21.9* 22.8* --  PLT 104* 115* -- 129*   BMET  Basename 06/06/12 0448 06/05/12 0700 06/04/12 2308  NA 128* 126* 124*  K 3.9 3.4* 3.3*  CL 95* 92* 91*  CO2 22 22  22  GLUCOSE 76 91 112*  BUN 24* 35* 43*  CREATININE 1.00 1.10 1.36*  CALCIUM 9.0 8.7 8.7   PT/INR  Basename 06/04/12 0249 06/03/12 0910  LABPROT 15.0 20.3*  INR 1.20 1.81*    Assessment/Plan  Principal Problem:  *Hyponatremia - hypovolemic Active Problems:  Paroxysmal atrial fibrillation  GI bleed  Diabetes mellitus type 2 with complications  Leukocytosis  ARF (acute renal failure) - Pre-renal  Anemia  Hypokalemia  Cardiomyopathy, ischemic, EF 25% by echo 11/28/11.  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  He just ambulated with PT.  Asymptomatic.  Na+ improved.  Net fluids still negative(777ml).  S/P two units PRBCs-Hgb  improved to 8.7 from 7.7(9/25). Pretransfusion 5.8.  SCr improved to 1.00.   Hypotension: 70/50 - 117/52.   No BP or HF meds at this time.      LOS: 3 days    HAGER, BRYAN 06/06/2012 8:10 AM  I have seen and examined the patient along with Wilburt Finlay, PA.  I have reviewed the chart, notes and new data.  I agree with PA's note.  Key new complaints: feels better, walked the unit with PT Key examination changes: no rales, no JVD,  No S3. BP still low. In NSR. Key new findings / data: marked improvement in Na and renal parameters.  PLAN: Suspect he is still mildly hypovolemic. No signs of HF. Allow him to gradually gain volume back. Hold all diuretics and BP meds for now. Possibly EGD tomorrow? As per his history, sounds more like melena than diverticular bleed.  Thurmon Fair, MD, [facility] [phone] 06/06/2012, 2:40 PM

## 2012-06-07 LAB — BASIC METABOLIC PANEL
BUN: 14 mg/dL (ref 6–23)
Calcium: 9 mg/dL (ref 8.4–10.5)
Creatinine, Ser: 0.83 mg/dL (ref 0.50–1.35)
GFR calc Af Amer: >90 mL/min (ref >90)
GFR calc non Af Amer: 90 mL/min — ABNORMAL LOW (ref >90)

## 2012-06-07 LAB — CBC
HCT: 25.8 % — ABNORMAL LOW (ref 39.0–52.0)
MCHC: 34.5 g/dL (ref 30.0–36.0)
RDW: 15.7 % — ABNORMAL HIGH (ref 11.5–15.5)

## 2012-06-07 LAB — GLUCOSE, CAPILLARY: Glucose-Capillary: 79 mg/dL (ref 70–99)

## 2012-06-07 NOTE — Progress Notes (Signed)
[facility] Progress Note [place] TEAM 1 - Stepdown/ICU TEAM   Steve Mccarty JXB:[id number] DOB: 1946/02/13 DOA: 06/03/2012 PCP: Thayer Headings, MD  Brief narrative: 66 y.o. male with h/o a fib on xeralto, recently discharged from the hospital for diverticular bleed was brought in for persistent dizziness and multiple falls. He denied any chest pain, sob. On arrival to ED, he was found to have profound hyponatremia, hypokalemia, and guiac positive stools. He was orthostatic. He was admitted to hospitalist service for evaluation and management of severe hyponatremia, hypokalemia, and acute on chronic renal failure in the setting of dehydration.   Assessment/Plan:  Hyponatremia Felt to be hypovolemic hyponatremia - Has improved with replacing volume.   Hypokalemia Improving with replacement - continue to follow trend  Hypotension Due to dehydration as well as significant blood loss anemia - BP slowing climbing.   Syncope Due to dehydration and anemia as mentioned above.   guiac positive stool / GI bleed Patient admits to black stools 3 or 4 weeks ago lasting 4 days - he underwent an EGD in July of this year by Dr. Elnoria Howard at which time he was found to have antral ulcers - a colonoscopy was carried out in February of this year revealing colonic diverticulosis - it was suggested he be placed on twice a day PPI but the patient apparently went home on Prilosec once a day - status post 2 units packed red blood cells - given his recent extensive evaluation, and inadequate outpatient treatment in the face of ongoing use of anticoagulant, there's likely little GI would have to offer at this time  Despite his complaint of "black stool" his hemoglobin has remained stable since the transfusion was given and therefore, I do not suspect he is currently bleeding. He will need a BID PPI for the next month. No anticoagulation for now.   Acute blood loss anemia Status post 2 units packed red  blood cells - patient reached a nadir of 5.8 - he experienced an appropriate expected improvement to 8.0 after 2 units of blood - with ongoing hydration he has drifted back down to 7.7 - however after discontinuing hydration, hemoglobin is now climbing. Will cont to follow carefully.   Atrial fibrillation Rate is currently controlled - cardiology is following - clearly the patient is not a candidate for anticoagulation at this time  Acute renal failure Renal function has normalized with volume expansion  Ischemic cardiomyopathy with ejection fraction 25% via echocardiogram 11/27/2028 Cont to follow for fluid overload- diuretics on hold for now.   Thrombocytopenia Follow- etiology uncertain  Diabetes Reportedly has been off of medical therapy since 2010 - CBGs are currently reasonably well-controlled - we'll follow  Code Status: DO NOT INTUBATE Disposition Plan: transfer to telemetry  Consultants: [facility]  Procedures: none  Antibiotics: none  DVT prophylaxis: SCDs due to ? bleeding  HPI/Subjective: The patient is sitting up in a chair. He has no complaints of dizziness with ambulation stools are still dark/ black in appearance.     Objective: Blood pressure 122/63, pulse 82, temperature 97.9 F (36.6 C), temperature source Oral, resp. rate 20, height 5\' 10"  (1.778 m), weight 66.5 kg (146 lb 9.7 oz), SpO2 100.00%.  Intake/Output Summary (Last 24 hours) at 06/07/12 1346 Last data filed at 06/07/12 1100  Gross per 24 hour  Intake   3775 ml  Output   5270 ml  Net  -1495 ml     Exam: General: No acute respiratory distress Lungs: Clear to auscultation  bilaterally without wheezes or crackles Cardiovascular: Regular rate with 2/6 holosystolic murmur Abdomen: Nontender, nondistended, soft, bowel sounds positive, no rebound, no ascites, no appreciable mass Extremities: No significant cyanosis, clubbing, or edema bilateral lower extremities  Data Reviewed: Basic Metabolic  Panel:  Lab 06/07/12 0445 06/06/12 0448 06/05/12 0700 06/04/12 2308 06/04/12 0249  NA 127* 128* 126* 124* 119*  K 3.8 3.9 3.4* 3.3* 3.6  CL 96 95* 92* 91* 82*  CO2 22 22 22 22 24   GLUCOSE 80 76 91 112* 116*  BUN 14 24* 35* 43* 75*  CREATININE 0.83 1.00 1.10 1.36* 1.75*  CALCIUM 9.0 9.0 8.7 8.7 8.8  MG -- -- -- -- --  PHOS -- -- -- -- --   Liver Function Tests:  Lab 06/03/12 0857  AST 29  ALT 13  ALKPHOS 76  BILITOT 0.9  PROT 8.2  ALBUMIN 4.1   CBC:  Lab 06/07/12 0445 06/06/12 1259 06/06/12 0448 06/05/12 0700 06/04/12 2308 06/04/12 0957 06/03/12 0857  WBC 3.6* 4.7 5.0 5.6 -- 6.6 --  NEUTROABS -- -- -- -- -- -- 10.4*  HGB 8.9* 9.1* 8.7* 7.7* 8.0* -- --  HCT 25.8* 25.8* 25.4* 21.9* 22.8* -- --  MCV 80.4 80.9 80.9 80.5 -- 79.6 --  PLT 98* 100* 104* 115* -- 129* --   Cardiac Enzymes:  Lab 06/04/12 0248 06/03/12 2130 06/03/12 1433 06/03/12 0857  CKTOTAL -- -- -- --  CKMB -- -- -- --  CKMBINDEX -- -- -- --  TROPONINI <0.30 <0.30 <0.30 <0.30   CBG:  Lab 06/07/12 1200 06/07/12 0753 06/06/12 2218 06/06/12 1711 06/06/12 1132  GLUCAP 80 79 91 76 77    Recent Results (from the past 240 hour(s))  MRSA PCR SCREENING     Status: Normal   Collection Time   06/03/12  2:44 PM      Component Value Range Status Comment   MRSA by PCR NEGATIVE  NEGATIVE Final      Studies:  Recent x-ray studies have been reviewed in detail by the Attending Physician  Scheduled Meds:  Reviewed in detail by the Attending Physician   Calvert Cantor, MD [phone]  If 7PM-7AM, please contact night-coverage [email or web address] Password [email or web address] 06/07/2012, 1:46 PM   LOS: 4 days

## 2012-06-07 NOTE — Progress Notes (Signed)
Occupational Therapy Treatment Patient Details Name: MOHMMED DHARIA MRN: [id number] DOB: 03/09/1946 Today's Date: 06/07/2012 Time: 1450-1505 OT Time Calculation (min): 15 min  OT Assessment / Plan / Recommendation Comments on Treatment Session Asymptomatic with BP this pm. Pt needs TUB BENCH for home D/C.  Making good progress and states he feels much better.    Follow Up Recommendations  Home health OT;Supervision - Intermittent    Barriers to Discharge       Equipment Recommendations  Tub/shower bench    Recommendations for Other Services    Frequency Min 2X/week   Plan Discharge plan remains appropriate    Precautions / Restrictions Precautions Precautions: Fall Restrictions Weight Bearing Restrictions: No   Pertinent Vitals/Pain none    ADL  ADL Comments: Pt completling functional moiblity within room and @ unit. Disucssed use of tub bench as well as home safety.    OT Diagnosis:    OT Problem List:   OT Treatment Interventions:     OT Goals Acute Rehab OT Goals OT Goal Formulation: With patient Time For Goal Achievement: 06/19/12 Potential to Achieve Goals: Good ADL Goals Pt Will Perform Grooming: with modified independence;Standing at sink ADL Goal: Grooming - Progress: Progressing toward goals Pt Will Perform Upper Body Bathing: with modified independence;Sitting at sink Pt Will Perform Lower Body Bathing: with modified independence;Sitting at sink;Standing at sink ADL Goal: Lower Body Bathing - Progress: Progressing toward goals Pt Will Perform Upper Body Dressing: with modified independence;Sitting, chair ADL Goal: Upper Body Dressing - Progress: Progressing toward goals Pt Will Perform Lower Body Dressing: with modified independence;Sit to stand from chair ADL Goal: Lower Body Dressing - Progress: Progressing toward goals Pt Will Transfer to Toilet: with modified independence;Ambulation;with DME;Regular height toilet ADL Goal: Toilet Transfer -  Progress: Progressing toward goals Pt Will Perform Toileting - Clothing Manipulation: with modified independence;Standing ADL Goal: Toileting - Clothing Manipulation - Progress: Progressing toward goals Pt Will Perform Tub/Shower Transfer: with modified independence;Transfer tub bench;Ambulation;Shower seat with back ADL Goal: Web designer - Progress: Progressing toward goals  Visit Information  Last OT Received On: 06/07/12 Assistance Needed: +1    Subjective Data      Prior Functioning       Cognition  Overall Cognitive Status: Appears within functional limits for tasks assessed/performed Arousal/Alertness: Awake/alert Orientation Level: Appears intact for tasks assessed Behavior During Session: [place] [facility] for tasks performed    Mobility  Shoulder Instructions Bed Mobility Supine to Sit: 6: Modified independent (Device/Increase time);HOB elevated;With rails Sitting - Scoot to Edge of Bed: 6: Modified independent (Device/Increase time) Sit to Supine: 6: Modified independent (Device/Increase time) Details for Bed Mobility Assistance: No dizziness reported with positional changes.  Use of rail needed.   Transfers Transfers: Sit to Stand;Stand to Sit Sit to Stand: 6: Modified independent (Device/Increase time);With upper extremity assist;From bed Stand to Sit: 6: Modified independent (Device/Increase time);To bed;To chair/3-in-1;With upper extremity assist       Exercises      Balance  Improved with use of RW   End of Session OT - End of Session Equipment Utilized During Treatment: Gait belt Activity Tolerance: Patient tolerated treatment well Patient left: in bed;with call bell/phone within reach  GO     [facility] 06/07/2012, 3:10 PM [facility], OTR/L  [phone] 06/07/2012

## 2012-06-07 NOTE — Progress Notes (Signed)
Subjective:  No SOB, up in chair.  Objective:  Vital Signs in the last 24 hours: Temp:  [97 F (36.1 C)-98.5 F (36.9 C)] 98 F (36.7 C) (09/27 0745) Pulse Rate:  [86-92] 86  (09/27 0745) Resp:  [15-18] 18  (09/27 0745) BP: (85-117)/(35-60) 112/56 mmHg (09/27 0745) SpO2:  [95 %-99 %] 95 % (09/27 0745) Weight:  [66.5 kg (146 lb 9.7 oz)] 66.5 kg (146 lb 9.7 oz) (09/27 0400)  Intake/Output from previous day:  Intake/Output Summary (Last 24 hours) at 06/07/12 1052 Last data filed at 06/07/12 0900  Gross per 24 hour  Intake   4045 ml  Output   5020 ml  Net   -975 ml    Physical Exam: General appearance: alert, cooperative and no distress Lungs: clear to auscultation bilaterally Heart: regular rate and rhythm and 2/6 systolic murmur   Rate: 84  Rhythm: normal sinus rhythm and PACs  Lab Results:  Basename 06/07/12 0445 06/06/12 1259  WBC 3.6* 4.7  HGB 8.9* 9.1*  PLT 98* 100*    Basename 06/07/12 0445 06/06/12 0448  NA 127* 128*  K 3.8 3.9  CL 96 95*  CO2 22 22  GLUCOSE 80 76  BUN 14 24*  CREATININE 0.83 1.00   No results found for this basename: TROPONINI:2,CK,MB:2 in the last 72 hours Hepatic Function Panel No results found for this basename: PROT,ALBUMIN,AST,ALT,ALKPHOS,BILITOT,BILIDIR,IBILI in the last 72 hours No results found for this basename: CHOL in the last 72 hours No results found for this basename: INR in the last 72 hours  Imaging: Imaging results have been reviewed  Cardiac Studies:  Assessment/Plan:   Principal Problem:  *Hypotension Active Problems:  ARF (acute renal failure) - Pre-renal  Paroxysmal atrial fibrillation  Diabetes mellitus type 2 with complications  Anemia, Hgb 8.0 on admission, Xarelto stopped, transfused 3 units  Cardiomyopathy, ischemic, EF 25% by echo 11/28/11.  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 He seems stable from a CHF standpoint but may need to resume diuretics at some point.    Corine Shelter PA-C 06/07/2012, 10:52 AM  I have seen and examined the patient along with Corine Shelter PA-C.  I have reviewed the chart, notes and new data.  I agree with PA's note.  Key new complaints: no dyspnea Key examination changes: no overt CHF, BP still relatively low Key new findings / data: stable Hgb and renal function.  PLAN: Hold off diuretics for another day. He seems to be mobilizing and eliminating 3rd space fluids on his own.  Thurmon Fair, MD, [facility] [phone] 06/07/2012, 11:25 AM

## 2012-06-08 DIAGNOSIS — Z7901 Long term (current) use of anticoagulants: Secondary | ICD-10-CM

## 2012-06-08 DIAGNOSIS — E118 Type 2 diabetes mellitus with unspecified complications: Secondary | ICD-10-CM

## 2012-06-08 DIAGNOSIS — E876 Hypokalemia: Secondary | ICD-10-CM

## 2012-06-08 DIAGNOSIS — I509 Heart failure, unspecified: Secondary | ICD-10-CM

## 2012-06-08 LAB — GLUCOSE, CAPILLARY: Glucose-Capillary: 118 mg/dL — ABNORMAL HIGH (ref 70–99)

## 2012-06-08 NOTE — Progress Notes (Signed)
Patient ID: Steve Mccarty  male  ZOX:[id number]    DOB: 02-07-1946    DOA: 06/03/2012  PCP: Thayer Headings, MD  Subjective: Feeling well, sitting in the chair, nor overt bleeding, still dark stools, no shortness of breath or chest pain  Objective: Weight change: 0.905 kg (1 lb 15.9 oz)  Intake/Output Summary (Last 24 hours) at 06/08/12 1512 Last data filed at 06/08/12 1300  Gross per 24 hour  Intake    720 ml  Output   2650 ml  Net  -1930 ml   Blood pressure 96/60, pulse 89, temperature 98 F (36.7 C), temperature source Oral, resp. rate 18, height 5\' 10"  (1.778 m), weight 67.405 kg (148 lb 9.6 oz), SpO2 96.00%.  Physical Exam: General: Alert and awake, oriented x3, not in any acute distress. HEENT: anicteric sclera, pupils reactive to light and accommodation, EOMI CVS: S1-S2 clear, 2/6 holosystolic murmur Chest: clear to auscultation bilaterally, no wheezing, rales or rhonchi Abdomen: soft nontender, nondistended, normal bowel sounds, no organomegaly Extremities: no cyanosis, clubbing or edema noted bilaterally Neuro: Cranial nerves II-XII intact, no focal neurological deficits  Lab Results: Basic Metabolic Panel:  Lab 06/07/12 8119 06/06/12 0448  NA 127* 128*  K 3.8 3.9  CL 96 95*  CO2 22 22  GLUCOSE 80 76  BUN 14 24*  CREATININE 0.83 1.00  CALCIUM 9.0 9.0  MG -- --  PHOS -- --   Liver Function Tests:  Lab 06/03/12 0857  AST 29  ALT 13  ALKPHOS 76  BILITOT 0.9  PROT 8.2  ALBUMIN 4.1   CBC:  Lab 06/07/12 0445 06/06/12 1259 06/03/12 0857  WBC 3.6* 4.7 --  NEUTROABS -- -- 10.4*  HGB 8.9* 9.1* --  HCT 25.8* 25.8* --  MCV 80.4 80.9 --  PLT 98* 100* --   Cardiac Enzymes:  Lab 06/04/12 0248 06/03/12 2130 06/03/12 1433  CKTOTAL -- -- --  CKMB -- -- --  CKMBINDEX -- -- --  TROPONINI <0.30 <0.30 <0.30   BNP: No components found with this basename: POCBNP:2 CBG:  Lab 06/08/12 1124 06/08/12 0725 06/07/12 2141 06/07/12 1200 06/07/12 0753  GLUCAP  199* 90 111* 80 79     Micro Results: Recent Results (from the past 240 hour(s))  MRSA PCR SCREENING     Status: Normal   Collection Time   06/03/12  2:44 PM      Component Value Range Status Comment   MRSA by PCR NEGATIVE  NEGATIVE Final     Studies/Results: Dg Chest Port 1 View  06/03/2012  *RADIOLOGY REPORT*  Clinical Data: Syncope  PORTABLE CHEST - 1 VIEW  Comparison: 03/20/2012  Findings: The heart and pulmonary vascularity are within normal limits.  The lungs are clear bilaterally.  No acute bony abnormality is seen.  IMPRESSION: No acute intrathoracic abnormality.   Original Report Authenticated By: Phillips Odor, M.D.     Medications: Scheduled Meds:   . pantoprazole  40 mg Oral BID AC  . senna-docusate  2 tablet Oral QHS  . simvastatin  20 mg Oral q1800   Continuous Infusions:    Assessment/Plan: Principal Problem: Hypotension - Likely secondary to dehydration, GI bleed, diuretics, improving and stable  Hyponatremia:  felt to be hypovolemic hyponatremia - recheck BMET  Hypokalemia: Stable   Syncope: Due to dehydration and anemia as mentioned above.   guiac positive stool / GI bleed  - EGD in 7/13: showed antral ulcers, colonoscopy, 02/13 showed colonic diverticulosis and recommendation BID PPI  -  continue PPI twice a day, no anticoagulation and follow-up with gastroenterology outpatient   Acute blood loss anemia: status post 2 units packed red blood cells - monitor counts, hemoglobin stable  Atrial fibrillation  Rate is currently controlled - cardiology is following - clearly the patient is not a candidate for anticoagulation at this time   Acute renal failure: resolved with IV fluid hydration   Ischemic cardiomyopathy with ejection fraction 25% via echocardiogram 11/28/2011  Cont to follow for fluid overload- diuretics on hold for now, per cardiology likely to restart in a.m. Marland Kitchen  Thrombocytopenia: Etiology uncertain, improved   Diabetes  Reportedly  has been off of medical therapy since 2010 - CBGs are currently reasonably well-controlled - we'll follow   Code Status: DO NOT INTUBATE  DVT Prophylaxis:SCDs  Disposition:hopefully in 24-8 hours   LOS: 5 days   RAI,RIPUDEEP M.D. [facility] 06/08/2012, 3:12 PM Pager: [phone]  If 7PM-7AM, please contact night-coverage [email or web address] Password [email or web address]

## 2012-06-08 NOTE — Progress Notes (Signed)
[facility]  DAILY PROGRESS NOTE   Subjective:  Excellent UO, no overt bleeding, no clinical CHF.  Objective:  Temp:  [97.3 F (36.3 C)-99.1 F (37.3 C)] 98.1 F (36.7 C) (09/28 0907) Pulse Rate:  [75-98] 98  (09/28 0907) Resp:  [17-20] 20  (09/28 0907) BP: (96-122)/(51-68) 96/61 mmHg (09/28 0907) SpO2:  [94 %-100 %] 98 % (09/28 0907) Weight:  [67.405 kg (148 lb 9.6 oz)] 67.405 kg (148 lb 9.6 oz) (09/27 2157) Weight change: 0.905 kg (1 lb 15.9 oz)  Intake/Output from previous day: 09/27 0701 - 09/28 0700 In: 960 [P.O.:960] Out: 3650 [Urine:3650]  Intake/Output from this shift: Total I/O In: 240 [P.O.:240] Out: -   Medications: Current Facility-Administered Medications  Medication Dose Route Frequency Provider Last Rate Last Dose  . acetaminophen (TYLENOL) tablet 650 mg  650 mg Oral Q6H PRN Kathlen Mody, MD   650 mg at 06/07/12 1210  . oxyCODONE (Oxy IR/ROXICODONE) immediate release tablet 5 mg  5 mg Oral Q8H PRN Kathlen Mody, MD   5 mg at 06/07/12 1614  . pantoprazole (PROTONIX) EC tablet 40 mg  40 mg Oral BID AC Calvert Cantor, MD   40 mg at 06/08/12 1011  . senna-docusate (Senokot-S) tablet 2 tablet  2 tablet Oral QHS Calvert Cantor, MD   2 tablet at 06/07/12 2131  . simvastatin (ZOCOR) tablet 20 mg  20 mg Oral q1800 Kathlen Mody, MD   20 mg at 06/07/12 1709  . DISCONTD: insulin aspart (novoLOG) injection 0-9 Units  0-9 Units Subcutaneous TID WC Kathlen Mody, MD   1 Units at 06/04/12 1741  . DISCONTD: ondansetron (ZOFRAN) injection 4 mg  4 mg Intravenous Q6H PRN Kathlen Mody, MD      . DISCONTD: ondansetron (ZOFRAN) tablet 4 mg  4 mg Oral Q6H PRN Kathlen Mody, MD        Physical Exam: General appearance: no distress Neck: no adenopathy, no carotid bruit, no JVD, supple, symmetrical, trachea midline and thyroid not enlarged, symmetric, no tenderness/mass/nodules Lungs: clear to auscultation bilaterally Heart: regular rate and rhythm, S1, S2  normal and systolic murmur: systolic ejection 2/6, crescendo and decrescendo at 2nd right intercostal space Abdomen: soft, non-tender; bowel sounds normal; no masses,  no organomegaly Extremities: extremities normal, atraumatic, no cyanosis or edema Pulses: 2+ and symmetric Skin: Skin color, texture, turgor normal. No rashes or lesions Neurologic: Grossly normal  Lab Results: Results for orders placed during the hospital encounter of 06/03/12 (from the past 48 hour(s))  GLUCOSE, CAPILLARY     Status: Normal   Collection Time   06/06/12 11:32 AM      Component Value Range Comment   Glucose-Capillary 77  70 - 99 mg/dL   CBC     Status: Abnormal   Collection Time   06/06/12 12:59 PM      Component Value Range Comment   WBC 4.7  4.0 - 10.5 K/uL    RBC 3.19 (*) 4.22 - 5.81 MIL/uL    Hemoglobin 9.1 (*) 13.0 - 17.0 g/dL    HCT 16.1 (*) 09.6 - 52.0 %    MCV 80.9  78.0 - 100.0 fL    MCH 28.5  26.0 - 34.0 pg    MCHC 35.3  30.0 - 36.0 g/dL    RDW 04.5 (*) 40.9 - 15.5 %    Platelets 100 (*) 150 - 400 K/uL CONSISTENT WITH PREVIOUS RESULT  GLUCOSE, CAPILLARY     Status: Normal   Collection  Time   06/06/12  5:11 PM      Component Value Range Comment   Glucose-Capillary 76  70 - 99 mg/dL   GLUCOSE, CAPILLARY     Status: Normal   Collection Time   06/06/12 10:18 PM      Component Value Range Comment   Glucose-Capillary 91  70 - 99 mg/dL   BASIC METABOLIC PANEL     Status: Abnormal   Collection Time   06/07/12  4:45 AM      Component Value Range Comment   Sodium 127 (*) 135 - 145 mEq/L    Potassium 3.8  3.5 - 5.1 mEq/L    Chloride 96  96 - 112 mEq/L    CO2 22  19 - 32 mEq/L    Glucose, Bld 80  70 - 99 mg/dL    BUN 14  6 - 23 mg/dL    Creatinine, Ser 4.09  0.50 - 1.35 mg/dL    Calcium 9.0  8.4 - 81.1 mg/dL    GFR calc non Af Amer 90 (*) >90 mL/min    GFR calc Af Amer >90  >90 mL/min   CBC     Status: Abnormal   Collection Time   06/07/12  4:45 AM      Component Value Range Comment   WBC  3.6 (*) 4.0 - 10.5 K/uL    RBC 3.21 (*) 4.22 - 5.81 MIL/uL    Hemoglobin 8.9 (*) 13.0 - 17.0 g/dL    HCT 91.4 (*) 78.2 - 52.0 %    MCV 80.4  78.0 - 100.0 fL    MCH 27.7  26.0 - 34.0 pg    MCHC 34.5  30.0 - 36.0 g/dL    RDW 95.6 (*) 21.3 - 15.5 %    Platelets 98 (*) 150 - 400 K/uL CONSISTENT WITH PREVIOUS RESULT  GLUCOSE, CAPILLARY     Status: Normal   Collection Time   06/07/12  7:53 AM      Component Value Range Comment   Glucose-Capillary 79  70 - 99 mg/dL   GLUCOSE, CAPILLARY     Status: Normal   Collection Time   06/07/12 12:00 PM      Component Value Range Comment   Glucose-Capillary 80  70 - 99 mg/dL   GLUCOSE, CAPILLARY     Status: Abnormal   Collection Time   06/07/12  9:41 PM      Component Value Range Comment   Glucose-Capillary 111 (*) 70 - 99 mg/dL   GLUCOSE, CAPILLARY     Status: Normal   Collection Time   06/08/12  7:25 AM      Component Value Range Comment   Glucose-Capillary 90  70 - 99 mg/dL    Comment 1 Documented in Chart      Comment 2 Notify RN       Imaging: No results found.  Assessment:  1. Principal Problem: 2.  *Hypotension 3. Active Problems: 4.  Paroxysmal atrial fibrillation 5.  Diverticular bleed 7/13 6.  Diabetes mellitus type 2 with complications 7.  Hyponatremia  on admission 8.  Leukocytosis 9.  ARF (acute renal failure) - Pre-renal 10.  Anemia, Hgb 8.0 on admission, Xarelto stopped, transfused 3 units 11.  Hypokalemia, on admission 12.  Cardiomyopathy, ischemic, EF 25% by echo 11/28/11. 13.  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  Chronic anticoagulation, Xarelto prior to admission 15.   Plan:  1. Still with remarkable spontaneous diuresis (post ATN?, residual  effects of long acting metolazone and spironolactone?), but a little slower than the day before;  no labs today, was still hyponatremic yesterday. 2. BP trending towards normal, probably will resume diuretics tomorrow, especially if Na level normal. May  also be ready for dc tomorrow from cardiac standpoint.  Time Spent Directly with Patient:  30 minutes  Length of Stay:  LOS: 5 days    Mateus Rewerts 06/08/2012, 10:35 AM

## 2012-06-09 DIAGNOSIS — D72829 Elevated white blood cell count, unspecified: Secondary | ICD-10-CM

## 2012-06-09 DIAGNOSIS — I959 Hypotension, unspecified: Secondary | ICD-10-CM

## 2012-06-09 LAB — GLUCOSE, CAPILLARY: Glucose-Capillary: 94 mg/dL (ref 70–99)

## 2012-06-09 LAB — CBC
HCT: 25.2 % — ABNORMAL LOW (ref 39.0–52.0)
Hemoglobin: 8.4 g/dL — ABNORMAL LOW (ref 13.0–17.0)
MCH: 27.5 pg (ref 26.0–34.0)
MCHC: 33.3 g/dL (ref 30.0–36.0)
MCV: 82.4 fL (ref 78.0–100.0)

## 2012-06-09 LAB — BASIC METABOLIC PANEL
CO2: 22 mEq/L (ref 19–32)
Calcium: 8.8 mg/dL (ref 8.4–10.5)
Creatinine, Ser: 0.98 mg/dL (ref 0.50–1.35)

## 2012-06-09 LAB — PRO B NATRIURETIC PEPTIDE: Pro B Natriuretic peptide (BNP): 2046 pg/mL — ABNORMAL HIGH (ref 0–125)

## 2012-06-09 MED ORDER — PANTOPRAZOLE SODIUM 40 MG PO TBEC: 40.0000 mg | DELAYED_RELEASE_TABLET | Freq: Two times a day (BID) | ORAL | Status: AC

## 2012-06-09 NOTE — Discharge Summary (Signed)
Physician Discharge Summary  Patient ID: Steve Mccarty MRN: [id number] DOB/AGE: 67-Jul-1947 67 y.o.  Admit date: 06/03/2012 Discharge date: 06/09/2012  Primary Care Physician:  Thayer Headings, MD  Discharge Diagnoses:    .Hyponatremia  on admission: improving .ARF (acute renal failure) - Pre-renal .Paroxysmal atrial fibrillation .Diabetes mellitus type 2 with complications .Leukocytosis .Anemia, Hgb 8.0 on admission, Xarelto stopped, transfused 3 units .Hypokalemia, on admission .Cardiomyopathy, ischemic, EF 25% by echo 11/28/11. 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 .Hypotension .Chronic Biventricular CHF   Consults:  Cardiology, [facility]  Discharge Medications:   Medication List     As of 06/09/2012 12:20 PM    STOP taking these medications         aspirin EC 81 MG tablet      metolazone 2.5 MG tablet   Commonly known as: ZAROXOLYN      omeprazole 20 MG capsule   Commonly known as: PRILOSEC   Replaced by: pantoprazole 40 MG tablet      rivaroxaban 10 MG Tabs tablet   Commonly known as: XARELTO      spironolactone 25 MG tablet   Commonly known as: ALDACTONE      torsemide 100 MG tablet   Commonly known as: DEMADEX      TAKE these medications         acetaminophen 325 MG tablet   Commonly known as: TYLENOL   Take 1,300 mg by mouth 2 (two) times daily as needed. For pain      cholecalciferol 1000 UNITS tablet   Commonly known as: VITAMIN D   Take 1,000 Units by mouth daily.      multivitamin with minerals Tabs   Take 1 tablet by mouth daily.      pantoprazole 40 MG tablet   Commonly known as: PROTONIX   Take 1 tablet (40 mg total) by mouth 2 (two) times daily before a meal.      PHILLIPS 500 MG (LAX) Tabs   Generic drug: Magnesium Oxide   Take 1 tablet by mouth as needed. Upset stomach      pravastatin 40 MG tablet   Commonly known as: PRAVACHOL   Take 40 mg by mouth daily.      traMADol 50 MG tablet   Commonly known as: ULTRAM   Take 50 mg by mouth 2 (two) times daily as needed. pain         Brief H and P: For complete details please refer to admission H and P, but in brief Steve Mccarty is a 66 y.o. male with h/o a fib on xeralto, recently discharged from the hospital for diverticular bleed was brought in for persistent dizziness and multiple falls associated with it. On arrival to ED, he was found to have profound hyponatremia, hypokalemic, with guiac positive stools. He was orthostatic, as his pulse rate increased on sitting up and he was dizzy and had a presyncopal episode. He was admitted to hospitalist service for evaluation and management of severe, hyponatremia, hypokalemia, and acute on chronic renal failure in the setting of dehydration.    Hospital Course:  66 y.o. male with h/o a fib on xeralto, recently discharged from the hospital for diverticular bleed was brought in for persistent dizziness and multiple falls. He was admitted to hospitalist service for evaluation and management of severe hyponatremia, hypokalemia, and acute on chronic renal failure in the setting of dehydration.   Hypotension/dehydration/ Hyponatremia: Felt to be hypovolemic hyponatremia, improved with normal saline  hydration replacing volume and holding the diuretics. Na was 113 at the time of admission which improved 129 at the time of discharge. Diuretics will be slowly added at the time of outpatient follow-up with the cardiology. BP still remains borderline, patient is asymptomatic, diuretics were placed on hold and will be added according to the patient's BP tolerance outpatient.  Hypokalemia: lowest of 2.8 and improved with replacement  Syncope:  Due to dehydration and anemia as mentioned above.   guiac positive stool / GI bleed /acute blood loss anemia Patient admits to black stools 3 or 4 weeks ago lasting 4 days - he underwent an EGD in July/13 by Dr. Elnoria Howard and was found to have antral ulcers.  C-scope in 02/13 revealed colonic diverticulosis. Patient was recommended twice a day PPI but the patient apparently went home on Prilosec once a day. Patient received 2 units packed red blood cells for hemoglobin of 5.8 on 06/04/2012. Hb/Hct subsequently remained stable and at the time of discharge was 8.4/25.2. He was placed on BID PPI and recommended to follow up with Dr. Elnoria Howard in 2 weeks. Aspirin, xarelto were placed on hold.     Atrial fibrillation  Rate is currently controlled and followed by cardiology throughout, not a candidate for anticoagulation at this time due to the GI bleed at least for a month and then per evaluation from GI and cardiology.   Acute renal failure  Renal function has normalized with volume expansion   Ischemic cardiomyopathy with ejection fraction 25% via echocardiogram 11/27/2028   Thrombocytopenia: etiology uncertain   Day of Discharge BP 98/50  Pulse 89  Temp 97.9 F (36.6 C) (Oral)  Resp 18  Ht 5\' 10"  (1.778 m)  Wt 68.312 kg (150 lb 9.6 oz)  BMI 21.61 kg/m2  SpO2 100%  Physical Exam: General: Alert and awake oriented x3 not in any acute distress. HEENT: anicteric sclera, pupils reactive to light and accommodation CVS: S1-S2 clear no murmur rubs or gallops Chest: clear to auscultation bilaterally, no wheezing rales or rhonchi Abdomen: soft nontender, nondistended, normal bowel sounds, no organomegaly Extremities: no cyanosis, clubbing or edema noted bilaterally Neuro: Cranial nerves II-XII intact, no focal neurological deficits   The results of significant diagnostics from this hospitalization (including imaging, microbiology, ancillary and laboratory) are listed below for reference.    LAB RESULTS: Basic Metabolic Panel:  Lab 06/09/12 1610 06/07/12 0445  NA 129* 127*  K 4.2 3.8  CL 97 96  CO2 22 22  GLUCOSE 86 80  BUN 19 14  CREATININE 0.98 0.83  CALCIUM 8.8 9.0  MG 1.5 --  PHOS -- --   Liver Function Tests:  Lab 06/03/12 0857    AST 29  ALT 13  ALKPHOS 76  BILITOT 0.9  PROT 8.2  ALBUMIN 4.1   CBC:  Lab 06/09/12 0625 06/07/12 0445 06/03/12 0857  WBC 3.4* 3.6* --  NEUTROABS -- -- 10.4*  HGB 8.4* 8.9* --  HCT 25.2* 25.8* --  MCV 82.4 -- --  PLT 97* 98* --   Cardiac Enzymes:  Lab 06/04/12 0248 06/03/12 2130  CKTOTAL -- --  CKMB -- --  CKMBINDEX -- --  TROPONINI <0.30 <0.30   BNP: No components found with this basename: POCBNP:2 CBG:  Lab 06/09/12 1139 06/09/12 0743  GLUCAP 94 81    Significant Diagnostic Studies:  Dg Chest Port 1 View  06/03/2012  *RADIOLOGY REPORT*  Clinical Data: Syncope  PORTABLE CHEST - 1 VIEW  Comparison: 03/20/2012  Findings: The  heart and pulmonary vascularity are within normal limits.  The lungs are clear bilaterally.  No acute bony abnormality is seen.  IMPRESSION: No acute intrathoracic abnormality.   Original Report Authenticated By: Phillips Odor, M.D.      Disposition and Follow-up:     Discharge Orders    Future Orders Please Complete By Expires   Diet Carb Modified      Increase activity slowly      (HEART FAILURE PATIENTS) Call MD:  Anytime you have any of the following symptoms: 1) 3 pound weight gain in 24 hours or 5 pounds in 1 week 2) shortness of breath, with or without a dry hacking cough 3) swelling in the hands, feet or stomach 4) if you have to sleep on extra pillows at night in order to breathe.      Discharge instructions      Comments:   Please call Dr. Blanchie Dessert office tomorrow afternoon if you don't hear back from their office for your appointment       DISPOSITION: home  DIET: carb modified diet  ACTIVITY:as tolerated  TESTS THAT NEED FOLLOW-UP BMET, CBC at next followup  DISCHARGE FOLLOW-UP Follow-up Information    Follow up with Thayer Headings, MD. Schedule an appointment as soon as possible for a visit in 1 week.   Contact information:   [address] [place] [address] [phone]       Follow up  with Chrystie Nose, MD. Schedule an appointment as soon as possible for a visit in 1 week. (please confirm appointment tomorrow )    Contact information:   [address] [place] [address] [phone]       Follow up with HUNG,PATRICK D, MD. Schedule an appointment as soon as possible for a visit in 2 weeks. (for follow-up. Please donot take xarelto or Aspirin until GI and cardiology follow-up)    Contact information:   [address] [place] [address] [phone]          Time spent on Discharge: 42 mins  Signed:   Leeanne Butters M.D. [facility] 06/09/2012, 12:20 PM Pager: [phone]

## 2012-06-09 NOTE — Progress Notes (Signed)
Urine output has leveled off. Na level is improving. Hold off metolazone. Resume torsemide 50 mg daily and spironolactone 50 mg daily. Check BMET next Wednesday or Thursday. Follow up in the office in a week.  Thurmon Fair, MD, [facility] [phone] office [phone] pager

## 2012-07-02 ENCOUNTER — Emergency Department (HOSPITAL_COMMUNITY): Payer: Medicare Other

## 2012-07-02 ENCOUNTER — Observation Stay (HOSPITAL_COMMUNITY)
Admission: EM | Admit: 2012-07-02 | Discharge: 2012-07-05 | Disposition: A | Discharge status: Home / Self Care | Payer: Medicare Other | Attending: Internal Medicine | Admitting: Internal Medicine

## 2012-07-02 ENCOUNTER — Encounter (HOSPITAL_COMMUNITY): Payer: Self-pay | Admitting: *Deleted

## 2012-07-02 DIAGNOSIS — D72829 Elevated white blood cell count, unspecified: Secondary | ICD-10-CM

## 2012-07-02 DIAGNOSIS — I2589 Other forms of chronic ischemic heart disease: Secondary | ICD-10-CM | POA: Insufficient documentation

## 2012-07-02 DIAGNOSIS — E876 Hypokalemia: Secondary | ICD-10-CM

## 2012-07-02 DIAGNOSIS — K922 Gastrointestinal hemorrhage, unspecified: Secondary | ICD-10-CM

## 2012-07-02 DIAGNOSIS — I255 Ischemic cardiomyopathy: Secondary | ICD-10-CM

## 2012-07-02 DIAGNOSIS — G2589 Other specified extrapyramidal and movement disorders: Secondary | ICD-10-CM | POA: Insufficient documentation

## 2012-07-02 DIAGNOSIS — I2510 Atherosclerotic heart disease of native coronary artery without angina pectoris: Secondary | ICD-10-CM | POA: Insufficient documentation

## 2012-07-02 DIAGNOSIS — E119 Type 2 diabetes mellitus without complications: Secondary | ICD-10-CM | POA: Insufficient documentation

## 2012-07-02 DIAGNOSIS — D649 Anemia, unspecified: Secondary | ICD-10-CM

## 2012-07-02 DIAGNOSIS — E118 Type 2 diabetes mellitus with unspecified complications: Secondary | ICD-10-CM | POA: Diagnosis present

## 2012-07-02 DIAGNOSIS — I959 Hypotension, unspecified: Secondary | ICD-10-CM

## 2012-07-02 DIAGNOSIS — I129 Hypertensive chronic kidney disease with stage 1 through stage 4 chronic kidney disease, or unspecified chronic kidney disease: Secondary | ICD-10-CM | POA: Insufficient documentation

## 2012-07-02 DIAGNOSIS — E871 Hypo-osmolality and hyponatremia: Secondary | ICD-10-CM | POA: Insufficient documentation

## 2012-07-02 DIAGNOSIS — T400X1A Poisoning by opium, accidental (unintentional), initial encounter: Principal | ICD-10-CM | POA: Insufficient documentation

## 2012-07-02 DIAGNOSIS — G2579 Other drug induced movement disorders: Secondary | ICD-10-CM

## 2012-07-02 DIAGNOSIS — I480 Paroxysmal atrial fibrillation: Secondary | ICD-10-CM | POA: Diagnosis present

## 2012-07-02 DIAGNOSIS — I4891 Unspecified atrial fibrillation: Secondary | ICD-10-CM | POA: Insufficient documentation

## 2012-07-02 DIAGNOSIS — M79609 Pain in unspecified limb: Secondary | ICD-10-CM | POA: Insufficient documentation

## 2012-07-02 DIAGNOSIS — Z79899 Other long term (current) drug therapy: Secondary | ICD-10-CM | POA: Insufficient documentation

## 2012-07-02 DIAGNOSIS — I50812 Chronic right heart failure: Secondary | ICD-10-CM

## 2012-07-02 DIAGNOSIS — Z7901 Long term (current) use of anticoagulants: Secondary | ICD-10-CM

## 2012-07-02 DIAGNOSIS — R4182 Altered mental status, unspecified: Secondary | ICD-10-CM

## 2012-07-02 DIAGNOSIS — M6281 Muscle weakness (generalized): Secondary | ICD-10-CM | POA: Insufficient documentation

## 2012-07-02 DIAGNOSIS — N179 Acute kidney failure, unspecified: Secondary | ICD-10-CM

## 2012-07-02 DIAGNOSIS — N183 Chronic kidney disease, stage 3 unspecified: Secondary | ICD-10-CM | POA: Insufficient documentation

## 2012-07-02 DIAGNOSIS — I10 Essential (primary) hypertension: Secondary | ICD-10-CM

## 2012-07-02 DIAGNOSIS — Z7982 Long term (current) use of aspirin: Secondary | ICD-10-CM | POA: Insufficient documentation

## 2012-07-02 DIAGNOSIS — R29898 Other symptoms and signs involving the musculoskeletal system: Secondary | ICD-10-CM

## 2012-07-02 LAB — COMPREHENSIVE METABOLIC PANEL
AST: 27 U/L (ref 0–37)
Albumin: 2.7 g/dL — ABNORMAL LOW (ref 3.5–5.2)
Calcium: 9.6 mg/dL (ref 8.4–10.5)
Chloride: 93 mEq/L — ABNORMAL LOW (ref 96–112)
Creatinine, Ser: 0.8 mg/dL (ref 0.50–1.35)
Total Bilirubin: 1.9 mg/dL — ABNORMAL HIGH (ref 0.3–1.2)
Total Protein: 7.2 g/dL (ref 6.0–8.3)

## 2012-07-02 LAB — CBC
HCT: 29.9 % — ABNORMAL LOW (ref 39.0–52.0)
Hemoglobin: 10.1 g/dL — ABNORMAL LOW (ref 13.0–17.0)
MCH: 27 pg (ref 26.0–34.0)
MCV: 79.9 fL (ref 78.0–100.0)
RBC: 3.74 MIL/uL — ABNORMAL LOW (ref 4.22–5.81)
WBC: 5.9 10*3/uL (ref 4.0–10.5)

## 2012-07-02 LAB — GLUCOSE, CAPILLARY: Glucose-Capillary: 76 mg/dL (ref 70–99)

## 2012-07-02 LAB — CK: Total CK: 102 U/L (ref 7–232)

## 2012-07-02 LAB — ETHANOL: Alcohol, Ethyl (B): <11 mg/dL (ref 0–11)

## 2012-07-02 LAB — URINALYSIS, ROUTINE W REFLEX MICROSCOPIC
Glucose, UA: NEGATIVE mg/dL
Hgb urine dipstick: NEGATIVE
Specific Gravity, Urine: 1.021 (ref 1.005–1.030)
pH: 5.5 (ref 5.0–8.0)

## 2012-07-02 MED ORDER — LORAZEPAM 2 MG/ML IJ SOLN
2.0000 mg | Freq: Once | INTRAMUSCULAR | Status: AC
  Administered 2012-07-02: 2 mg via INTRAVENOUS
  Filled 2012-07-02: qty 1

## 2012-07-02 MED ORDER — LACTATED RINGERS IV BOLUS (SEPSIS)
500.0000 mL | Freq: Once | INTRAVENOUS | Status: AC
  Administered 2012-07-02: 500 mL via INTRAVENOUS

## 2012-07-02 MED ORDER — LACTATED RINGERS IV SOLN
INTRAVENOUS | Status: DC
  Administered 2012-07-02: 22:00:00 via INTRAVENOUS

## 2012-07-02 MED ORDER — HYDROCODONE-ACETAMINOPHEN 5-325 MG PO TABS
2.0000 | ORAL_TABLET | Freq: Once | ORAL | Status: AC
  Administered 2012-07-02: 2 via ORAL
  Filled 2012-07-02: qty 2

## 2012-07-02 NOTE — ED Notes (Signed)
Went to remind pt of the need for urine sample, and found pt chewing on electrode from cardiac monitor.Electrode removed from pts mouth, and RN made aware.

## 2012-07-02 NOTE — ED Provider Notes (Addendum)
History     CSN: [id number]  Arrival date & time 07/02/12  1701   First MD Initiated Contact with Patient 07/02/12 1917      Chief Complaint  Patient presents with  . Fall  . Weakness    (Consider location/radiation/quality/duration/timing/severity/associated sxs/prior treatment) HPI Steve Mccarty is a 66 y.o. male hospitalized one month ago for a GI bleed likely exacerbated by Xarelto for atrial fibrillation, who is been doing well at home otherwise presents after a fall from chair and Saturday.  Patient says he uses this chair to get around at home when he is feeling tired however slipped off it and it was laid on the floor for 4 days. He says his legs are per week and is unable to stand or walk. He's having achy-type leg pain is 9/10 in intensity. His legs have been bothering him recently, and he has been taking up to 5-6, 50 mg tramadol tablets to help with the pain. He is unclear how may times he has actually taken the 5-6 tablets of tramadol.   This all began on Saturday prior to the fall, the patient was walking around his house and mopping and wood floors and felt weak as this point he sat down chair and fell off it and had been on the floor   Until he called for help today. He says "I'm in eating ice cream and drinking sodas and Saturday."  Patient denies any fevers, chills, chest pain, shortness of breath, abdominal pain, nausea or vomiting.  Patient says he used to drink heavily 20-30 years ago but currently only drinks 2 beers a few nights a week.  Past Medical History  Diagnosis Date  . Atrial fibrillation   . Hypertension   . Atrial fibrillation   . Coronary artery disease   . CHF (congestive heart failure)     perpheral edema  . Chronic renal disease, stage III   . Joint pain   . Aortic valve insufficiency, rheumatic   . Diabetes mellitus     Was for 10 years, stopped Metformin 2010  . GERD (gastroesophageal reflux disease)   . Blood in stool   .  Cardiomyopathy, ischemic, EF 25% by echo 11/28/11. 06/03/2012  . Chronic right-sided HF (heart failure)     ascites    Past Surgical History  Procedure Date  . Tonsillectomy   . Cardiac catheterization   . Esophagogastroduodenoscopy 11/03/2011    Procedure: ESOPHAGOGASTRODUODENOSCOPY (EGD);  Surgeon: Theda Belfast, MD;  Location: [facility];  Service: Endoscopy;  Laterality: N/A;  . Colonoscopy 11/03/2011    Procedure: COLONOSCOPY;  Surgeon: Theda Belfast, MD;  Location: [facility];  Service: Endoscopy;  Laterality: N/A;  . Esophagogastroduodenoscopy 03/22/2012    Procedure: ESOPHAGOGASTRODUODENOSCOPY (EGD);  Surgeon: Theda Belfast, MD;  Location: [facility];  Service: Endoscopy;  Laterality: N/A;  . Esophagogastroduodenoscopy 04/04/2012    Procedure: ESOPHAGOGASTRODUODENOSCOPY (EGD);  Surgeon: Theda Belfast, MD;  Location: [facility];  Service: Endoscopy;  Laterality: N/A;    History reviewed. No pertinent family history.  History  Substance Use Topics  . Smoking status: Never Smoker   . Smokeless tobacco: Never Used  . Alcohol Use: Yes     rarely    Review of Systems   At least 10pt or greater review of systems completed and are negative except where specified in the HPI.  Allergies  Review of patient's allergies indicates no known allergies.  Home Medications   Current Outpatient Rx  Name Route  Sig Dispense Refill  . ASPIRIN EC 81 MG PO TBEC Oral Take 81 mg by mouth daily.    Marland Kitchen VITAMIN D 1000 UNITS PO TABS Oral Take 1,000 Units by mouth daily.    Marland Kitchen DOCUSATE SODIUM 100 MG PO CAPS Oral Take 100 mg by mouth 2 (two) times daily as needed. constipation    . MAGNESIUM OXIDE 500 MG (LAX) PO TABS Oral Take 1 tablet by mouth as needed. Upset stomach    . ADULT MULTIVITAMIN W/MINERALS CH Oral Take 1 tablet by mouth daily.    Marland Kitchen PANTOPRAZOLE SODIUM 40 MG PO TBEC Oral Take 1 tablet (40 mg total) by mouth 2 (two) times daily before a meal. 60 tablet 3  . PRAVASTATIN SODIUM 40  MG PO TABS Oral Take 40 mg by mouth daily.    Marland Kitchen SPIRONOLACTONE 50 MG PO TABS Oral Take 25 mg by mouth daily.    . TORSEMIDE 100 MG PO TABS Oral Take 50 mg by mouth daily.    . TRAMADOL HCL 50 MG PO TABS Oral Take 50 mg by mouth 2 (two) times daily as needed. pain      BP 126/59  Pulse 96  Temp 97.8 F (36.6 C) (Oral)  Resp 20  SpO2 100%  Physical Exam  Nursing notes reviewed.  Electronic medical record reviewed. VITAL SIGNS:   Filed Vitals:   07/02/12 1719 07/02/12 2359  BP: 126/59 129/58  Pulse: 96 71  Temp: 97.8 F (36.6 C) 98 F (36.7 C)  TempSrc: Oral Oral  Resp: 20 20  SpO2: 100% 100%   CONSTITUTIONAL: Awake, oriented x2, appears non-toxic HENT: Atraumatic, normocephalic, oral mucosa pink and moist, airway patent. Nares patent without drainage. External ears normal. EYES: Conjunctiva clear, EOMI, PERRLA NECK: Trachea midline, non-tender, supple CARDIOVASCULAR: Normal heart rate, Normal rhythm, No murmurs, rubs, gallops PULMONARY/CHEST: Clear to auscultation, no rhonchi, wheezes, or rales. Symmetrical breath sounds. Non-tender. ABDOMINAL: Non-distended, soft, non-tender - no rebound or guarding.  BS normal. NEUROLOGIC: Moving all four extremities, weakness in upper and lower extremities 4/5, Muscles are tight bilaterally upper and lower extremities, rigid to passive flexion and extension. Patient has extended clonus at the ankles.  Reflexes are 2-3+.  Babinski is mute. No gross sensory deficits. Occasional tremor in the right hand. EXTREMITIES: No clubbing, cyanosis, or edema. Bilateral Dupuytren's contracture.  SKIN: Warm, Dry, No erythema, No rash  ED Course  CRITICAL CARE Performed by: Jones Skene Authorized by: Jones Skene Total critical care time: 30 minutes Critical care was necessary to treat or prevent imminent or life-threatening deterioration of the following conditions: CNS failure or compromise. Critical care was time spent personally by me on the  following activities: discussions with consultants, evaluation of patient's response to treatment, obtaining history from patient or surrogate, ordering and review of laboratory studies, pulse oximetry, review of old charts, re-evaluation of patient's condition, ordering and review of radiographic studies, ordering and performing treatments and interventions and examination of patient.   (including critical care time)  Date: 07/03/2012  Rate: 78  Rhythm: normal sinus rhythm  QRS Axis: normal  Intervals: normal  ST/T Wave abnormalities: inverted T waves in V5/V6 with mild ST depression in those leads - seen in prior ECG dated 06/04/2012  Conduction Disutrbances: none  Narrative Interpretation: unremarkable - non-ischemic ECG     Labs Reviewed  COMPREHENSIVE METABOLIC PANEL - Abnormal; Notable for the following:    Sodium 130 (*)     Chloride 93 (*)  Albumin 2.7 (*)     Total Bilirubin 1.9 (*)     All other components within normal limits  CBC - Abnormal; Notable for the following:    RBC 3.74 (*)     Hemoglobin 10.1 (*)     HCT 29.9 (*)     All other components within normal limits  URINALYSIS, ROUTINE W REFLEX MICROSCOPIC - Abnormal; Notable for the following:    Color, Urine AMBER (*)  BIOCHEMICALS MAY BE AFFECTED BY COLOR   Bilirubin Urine MODERATE (*)     Ketones, ur 40 (*)     Urobilinogen, UA 4.0 (*)     All other components within normal limits  MAGNESIUM  CK  ETHANOL  GLUCOSE, CAPILLARY  AMMONIA   Ct Head Wo Contrast  07/02/2012  *RADIOLOGY REPORT*  Clinical Data: Recent fall, weakness  CT HEAD WITHOUT CONTRAST  Technique:  Contiguous axial images were obtained from the base of the skull through the vertex without contrast.  Comparison: None.  Findings: The ventricular system is prominent as are the cortical sulci indicative of diffuse atrophy.  Mild small vessel ischemic change is present in the periventricular white matter.  No hemorrhage, mass lesion, or acute  infarction is seen.  On bone window images, no calvarial abnormality is seen.  IMPRESSION: Atrophy and small vessel disease.  No acute intracranial abnormality.   Original Report Authenticated By: Juline Patch, M.D.    No diagnosis found.    MDM  Steve Mccarty is a 66 y.o. male presenting with generalized weakness and muscle rigidity. Patient's mental status is also questionable as he is alert and oriented x2 only. Patient does have a history of alcohol abuse, and electrolyte abnormalities from using diuretics. We'll check electrolytes to rule out severe abnormalities causing muscle tightness, however my concern is that the patient is involuntary overdose on tramadol potentially causing some mild serotonin syndrome. Patient is nonhypertensive, not hyperthermic - but he does have a tremor his knees are hyperreflexic, he is increased muscular tone which he says is new, and he does have some inducible clonus in the lower extremities. As the patient's vital signs have been stable and within normal limits-I'll not administer cyproheptadine at this time.  Give the patient some Ativan to control symptoms, and perform laboratory testing.   Patient does have an elevated bilirubin consistent with prior alcohol usage, and a mildly low sodium of 130, patient is receiving fluid at this time. Urine is inconsistent with urinary tract infection. CK is within normal limits - there is no blood in the urine so the patient does not have rhabdomyolysis. Patient's alcohol is negative, magnesium is normal, potassium is normal. CBC is unremarkable, CT of the head is unremarkable.  Patient reevaluated after some sedation with Ativan, saturating 100% stable vital signs. Patient says symptoms are improved with Ativan. Patient remains confused. Able to induce clonus in the sleeping patient however the patient's muscles were slightly less rigid. No autonomic instability at this time. Patient is still unable to get up,  walk.  Discussed with hospitalist for admission.  Discussed with neurologist who will see the patient in the [facility] long emergency department this evening.          Jones Skene, MD 07/03/12 1610  Jones Skene, MD 07/03/12 9604  Jones Skene, MD 07/03/12 5409

## 2012-07-02 NOTE — ED Notes (Signed)
Pt is still unable to provide urine sample/  

## 2012-07-02 NOTE — ED Notes (Signed)
Pt is aware of the need for a urine sample, however pt states they are unable to provide one at this time.

## 2012-07-02 NOTE — ED Notes (Signed)
Pt states he slid out the chair at home on Saturday d/t weakness, pt states he was unable to get himself off the floor and has been rolling around on the hard wood floors since then, pt states his legs are very weak he is unable to stand or walk, pt states having leg/knee pain 9/10, pt states finally called for help today, states "I've been eating ice cream and drinking soda since Saturday". Pt able to lift R leg slightly and states "I can't lift my left leg I'm too weak".

## 2012-07-03 ENCOUNTER — Observation Stay (HOSPITAL_COMMUNITY): Admit: 2012-07-03 | Discharge: 2012-07-03 | Disposition: A | Discharge status: Home / Self Care | Payer: Medicare Other

## 2012-07-03 ENCOUNTER — Encounter (HOSPITAL_COMMUNITY): Payer: Self-pay | Admitting: Emergency Medicine

## 2012-07-03 DIAGNOSIS — R6889 Other general symptoms and signs: Secondary | ICD-10-CM

## 2012-07-03 DIAGNOSIS — I2510 Atherosclerotic heart disease of native coronary artery without angina pectoris: Secondary | ICD-10-CM

## 2012-07-03 DIAGNOSIS — R4182 Altered mental status, unspecified: Secondary | ICD-10-CM

## 2012-07-03 DIAGNOSIS — I10 Essential (primary) hypertension: Secondary | ICD-10-CM

## 2012-07-03 DIAGNOSIS — G2589 Other specified extrapyramidal and movement disorders: Secondary | ICD-10-CM

## 2012-07-03 LAB — RPR: RPR Ser Ql: NONREACTIVE

## 2012-07-03 LAB — BASIC METABOLIC PANEL
BUN: 20 mg/dL (ref 6–23)
Calcium: 9.5 mg/dL (ref 8.4–10.5)
Chloride: 94 mEq/L — ABNORMAL LOW (ref 96–112)
Creatinine, Ser: 0.76 mg/dL (ref 0.50–1.35)
GFR calc Af Amer: >90 mL/min (ref >90)
GFR calc non Af Amer: >90 mL/min (ref >90)

## 2012-07-03 LAB — CBC
MCHC: 32.8 g/dL (ref 30.0–36.0)
Platelets: 229 10*3/uL (ref 150–400)
RDW: 15.5 % (ref 11.5–15.5)
WBC: 5 10*3/uL (ref 4.0–10.5)

## 2012-07-03 LAB — VITAMIN B12: Vitamin B-12: 1013 pg/mL — ABNORMAL HIGH (ref 211–911)

## 2012-07-03 LAB — TSH: TSH: 1.941 u[IU]/mL (ref 0.350–4.500)

## 2012-07-03 MED ORDER — POTASSIUM CHLORIDE IN NACL 20-0.9 MEQ/L-% IV SOLN
INTRAVENOUS | Status: DC
  Administered 2012-07-03 – 2012-07-04 (×4): via INTRAVENOUS
  Filled 2012-07-03 (×6): qty 1000

## 2012-07-03 MED ORDER — PANTOPRAZOLE SODIUM 40 MG PO TBEC
40.0000 mg | DELAYED_RELEASE_TABLET | Freq: Two times a day (BID) | ORAL | Status: DC
Start: 2012-07-03 — End: 2012-07-05
  Administered 2012-07-03 – 2012-07-05 (×5): 40 mg via ORAL
  Filled 2012-07-03 (×7): qty 1

## 2012-07-03 MED ORDER — ACETAMINOPHEN 650 MG RE SUPP: 650.0000 mg | Freq: Four times a day (QID) | RECTAL | Status: DC | PRN

## 2012-07-03 MED ORDER — LORAZEPAM 2 MG/ML IJ SOLN: 2.0000 mg | INTRAMUSCULAR | Status: DC | PRN

## 2012-07-03 MED ORDER — CARVEDILOL 3.125 MG PO TABS
3.1250 mg | ORAL_TABLET | Freq: Two times a day (BID) | ORAL | Status: DC
  Administered 2012-07-03 – 2012-07-05 (×4): 3.125 mg via ORAL
  Filled 2012-07-03 (×7): qty 1

## 2012-07-03 MED ORDER — ONDANSETRON HCL 4 MG/2ML IJ SOLN: 4.0000 mg | Freq: Four times a day (QID) | INTRAMUSCULAR | Status: DC | PRN

## 2012-07-03 MED ORDER — ASPIRIN EC 81 MG PO TBEC
81.0000 mg | DELAYED_RELEASE_TABLET | Freq: Every day | ORAL | Status: DC
  Administered 2012-07-03 – 2012-07-05 (×3): 81 mg via ORAL
  Filled 2012-07-03 (×4): qty 1

## 2012-07-03 MED ORDER — SODIUM CHLORIDE 0.9 % IJ SOLN
3.0000 mL | Freq: Two times a day (BID) | INTRAMUSCULAR | Status: DC
  Administered 2012-07-04 (×2): 3 mL via INTRAVENOUS

## 2012-07-03 MED ORDER — SPIRONOLACTONE 25 MG PO TABS
25.0000 mg | ORAL_TABLET | Freq: Every day | ORAL | Status: DC
  Administered 2012-07-03 – 2012-07-05 (×3): 25 mg via ORAL
  Filled 2012-07-03 (×4): qty 1

## 2012-07-03 MED ORDER — ALUM & MAG HYDROXIDE-SIMETH 200-200-20 MG/5ML PO SUSP: 30.0000 mL | Freq: Four times a day (QID) | ORAL | Status: DC | PRN

## 2012-07-03 MED ORDER — DOCUSATE SODIUM 100 MG PO CAPS
100.0000 mg | ORAL_CAPSULE | Freq: Two times a day (BID) | ORAL | Status: DC | PRN
  Filled 2012-07-03: qty 1

## 2012-07-03 MED ORDER — ACETAMINOPHEN 325 MG PO TABS
650.0000 mg | ORAL_TABLET | Freq: Four times a day (QID) | ORAL | Status: DC | PRN
  Administered 2012-07-03 – 2012-07-04 (×3): 650 mg via ORAL
  Filled 2012-07-03 (×2): qty 2
  Filled 2012-07-03: qty 1

## 2012-07-03 MED ORDER — ENOXAPARIN SODIUM 30 MG/0.3ML ~~LOC~~ SOLN
30.0000 mg | SUBCUTANEOUS | Status: AC
  Administered 2012-07-03 – 2012-07-04 (×2): 30 mg via SUBCUTANEOUS
  Filled 2012-07-03 (×3): qty 0.3

## 2012-07-03 MED ORDER — ONDANSETRON HCL 4 MG PO TABS: 4.0000 mg | ORAL_TABLET | Freq: Four times a day (QID) | ORAL | Status: DC | PRN

## 2012-07-03 NOTE — ED Notes (Addendum)
Pt is AAOx2, but randomly states things like "When are we crashing cars?" during in and out cath. Pt also stated, "My ears are not letting me get up." while trying to ambulate pt. Pt is not ambulatory. Pt has been resting with eyes closed and respirations normal on assessment during stay.

## 2012-07-03 NOTE — Progress Notes (Signed)
Pt's HR increased to 150's-160's and sustaining. Pt asymptomatic at time, had recently gotten bed bath but lying in bed.  BP was 98/72.  Pt in NAD and no complaints.  Called NP on call, Donnamarie Poag and received new orders to perform 12 lead EKG and call results to Dr. Rito Ehrlich.  Also informed that pt's Attending MD would be Ardyth Harps, but to call Dr. Rito Ehrlich with EKG results until Dr. Ardyth Harps came on call.

## 2012-07-03 NOTE — Progress Notes (Signed)
Offsite portable EEG completed. 

## 2012-07-03 NOTE — Progress Notes (Signed)
Duplicate EEG order.

## 2012-07-03 NOTE — ED Notes (Signed)
Unable to stand pt with 2 staff assist. MD made aware

## 2012-07-03 NOTE — Evaluation (Signed)
Physical Therapy Evaluation Patient Details Name: Steve Mccarty MRN: [id number] DOB: 1946-03-14 Today's Date: 07/03/2012 Time: 1441-1510 PT Time Calculation (min): 29 min  PT Assessment / Plan / Recommendation Clinical Impression  Pt presents with rigidity and tremors with history of afib, cardiomyopathy, CHF and chronic renal disease.  Tolerated OOB and took some steps from bed to chair, however pt demos rigid LEs with upright stance and also with posterior leaning.  Pt very unsteady and weak with transfer and also stated that he has increased pain in B feet with WB.  Pt will benefit from skilled PT in acute venue to address deficits.  PT recommends 24/7 supervision/assist with HHPT vs ST SNF (noted that pts status is observation) vs CIR.      PT Assessment  Patient needs continued PT services    Follow Up Recommendations  Home health PT;Supervision/Assistance - 24 hour;Post acute inpatient    Does the patient have the potential to tolerate intense rehabilitation   Yes, Recommend IP Rehab Screening  Barriers to Discharge Decreased caregiver support      Equipment Recommendations  None recommended by PT    Recommendations for Other Services OT consult   Frequency Min 3X/week    Precautions / Restrictions Precautions Precautions: Fall Restrictions Weight Bearing Restrictions: No   Pertinent Vitals/Pain Some pain in feet with ambulation.       Mobility  Bed Mobility Bed Mobility: Supine to Sit;Sitting - Scoot to Edge of Bed Supine to Sit: 3: Mod assist;HOB flat Sitting - Scoot to Edge of Bed: 5: Supervision Details for Bed Mobility Assistance: Pt requires hand held assist to elevate trunk in order to sit on EOB with cues for hand placement and technique.  Pt states that his fingers are flexed and can not put palm flat on bed, therefore educated to keep fist balled.   Transfers Transfers: Sit to Stand;Stand to Sit;Stand Pivot Transfers Sit to Stand: 1: +2 Total  assist;From elevated surface;With upper extremity assist;From bed Sit to Stand: Patient Percentage: 60% Stand to Sit: 1: +2 Total assist;With upper extremity assist;With armrests;To chair/3-in-1 Stand to Sit: Patient Percentage: 60% Stand Pivot Transfers: 1: +2 Total assist Stand Pivot Transfers: Patient Percentage: 50% Details for Transfer Assistance: Assist to rise and steady.  Noted that pt was somewhat rigid with standing and tended to lock knees out with posterior leaning.  cues for forward leaning, hand placement and safety. Able to take some steps from bed to chair, however pt very weak and unsteady with cues for sequencing/technique with RW.  Ambulation/Gait Stairs: No Wheelchair Mobility Wheelchair Mobility: No    Shoulder Instructions     Exercises     PT Diagnosis: Difficulty walking;Generalized weakness;Acute pain;Abnormality of gait  PT Problem List: Decreased strength;Decreased activity tolerance;Decreased balance;Decreased mobility;Decreased coordination;Decreased knowledge of use of DME;Impaired tone PT Treatment Interventions: DME instruction;Gait training;Functional mobility training;Therapeutic activities;Therapeutic exercise;Balance training;Patient/family education;Stair training   PT Goals Acute Rehab PT Goals PT Goal Formulation: With patient Time For Goal Achievement: 07/10/12 Potential to Achieve Goals: Good Pt will go Supine/Side to Sit: with supervision PT Goal: Supine/Side to Sit - Progress: Goal set today Pt will go Sit to Supine/Side: with supervision PT Goal: Sit to Supine/Side - Progress: Goal set today Pt will go Sit to Stand: with supervision PT Goal: Sit to Stand - Progress: Goal set today Pt will go Stand to Sit: with supervision PT Goal: Stand to Sit - Progress: Goal set today Pt will Transfer Bed to Chair/Chair to Bed: with  supervision PT Transfer Goal: Bed to Chair/Chair to Bed - Progress: Goal set today Pt will Ambulate: 51 - 150 feet;with  min assist;with least restrictive assistive device PT Goal: Ambulate - Progress: Goal set today Pt will Go Up / Down Stairs: 3-5 stairs;with min assist;with least restrictive assistive device PT Goal: Up/Down Stairs - Progress: Goal set today  Visit Information  Last PT Received On: 07/03/12 Assistance Needed: +2 (safety)    Subjective Data  Subjective: I've been having trouble walking Patient Stated Goal: to figure out what is going on   Prior Functioning  Home Living Lives With: Alone Type of Home: House Home Access: Stairs to enter [employer] of Steps: 3 Entrance Stairs-Rails: None Home Layout: One level Bathroom Shower/Tub: Engineer, manufacturing systems: Standard Home Adaptive Equipment: Environmental consultant - rolling;Straight cane Additional Comments: States he does not have 24/7 supervision/assist, however he could arrange for intermittent care.  Prior Function Level of Independence: Independent with assistive device(s) Able to Take Stairs?: Yes Driving: No Vocation: Retired Musician: No difficulties    Cognition  Overall Cognitive Status: Appears within functional limits for tasks assessed/performed Arousal/Alertness: Awake/alert Orientation Level: Appears intact for tasks assessed Behavior During Session: [facility] for tasks performed    Extremity/Trunk Assessment Right Lower Extremity Assessment RLE ROM/Strength/Tone: Deficits RLE ROM/Strength/Tone Deficits: No tone noted with hip/knee flex, some with hip/knee ext, but mild, strength 5/5 all but hip flex at 3/5 RLE Sensation: WFL - Light Touch RLE Coordination: WFL - gross motor Left Lower Extremity Assessment LLE ROM/Strength/Tone: Deficits LLE ROM/Strength/Tone Deficits: No tone noted with hip/knee flex, some with hip/knee ext, mild, strength 5/5 all but hip flex 3+/5 LLE Sensation: WFL - Light Touch LLE Coordination: WFL - gross motor Trunk Assessment Trunk Assessment: Normal   Balance      End of Session PT - End of Session Equipment Utilized During Treatment: Gait belt Activity Tolerance: Patient limited by pain Patient left: in chair;with call bell/phone within reach Nurse Communication: Mobility status  GP Functional Assessment Tool Used: Clinical judgement Functional Limitation: Mobility: Walking and moving around Mobility: Walking and Moving Around Current Status ([id number]): At least 40 percent but less than 60 percent impaired, limited or restricted Mobility: Walking and Moving Around Goal Status [phone]): At least 20 percent but less than 40 percent impaired, limited or restricted   Lessie Dings 07/03/2012, 4:19 PM

## 2012-07-03 NOTE — Progress Notes (Signed)
Rehab Admissions Coordinator Note:  Patient was screened by Brock Ra for appropriateness for an Inpatient Acute Rehab Consult.  Note that pt is on Observation.  A pt who does not meet medical necessity for Acute Hosp will not meet criteria for CIR.  At this time, we are unable to recommend an Inpt Rehab consult.Richardson Dopp, Sherene Sires 07/03/2012, 4:36 PM  I can be reached at [phone].

## 2012-07-03 NOTE — Consult Note (Signed)
Reason for Consult:rigidity Referring Physician: Joneen Roach, D  CC: Unable to get up  History is obtained from:Patient  HPI: Steve Mccarty is an 66 y.o. male who states that he tried to sit down four days prior to presentation and missed his chair. Prior to this event he states that he was doing "great, wonderful" and had no difficulties walking. He states initially that he was down for "four hours" and then subsequently tells me "four days." He asks me if this "is where god comes down and heals people." To this, I ask him where he thinks he is and he responds "church."   He states that he does take tramadol, and will on occasion take more than prescribed, he states that he takes it once to twice per week. He does not think that he has had it since before he fell.   He states that he was much more "tight" in his muscles yesterday than he was today.     ROS: An 11 point ROS was performed and is negative except as noted in the HPI.  Past Medical History  Diagnosis Date  . Atrial fibrillation   . Hypertension   . Atrial fibrillation   . Coronary artery disease   . CHF (congestive heart failure)     perpheral edema  . Chronic renal disease, stage III   . Joint pain   . Aortic valve insufficiency, rheumatic   . Diabetes mellitus     Was for 10 years, stopped Metformin 2010  . GERD (gastroesophageal reflux disease)   . Blood in stool   . Cardiomyopathy, ischemic, EF 25% by echo 11/28/11. 06/03/2012  . Chronic right-sided HF (heart failure)     ascites    SHx: Never smoker.   Exam: Current vital signs: BP 128/54  Pulse 67  Temp 98 F (36.7 C) (Oral)  Resp 21  SpO2 99% Vital signs in last 24 hours: Temp:  [97.8 F (36.6 C)-98 F (36.7 C)] 98 F (36.7 C) (10/22 2359) Pulse Rate:  [67-96] 67  (10/23 0100) Resp:  [20-21] 21  (10/23 0100) BP: (103-129)/(49-59) 128/54 mmHg (10/23 0100) SpO2:  [98 %-100 %] 99 % (10/23 0100)  General: in bed, nad CV: rrr Neck: Supple Mental  Status: Patient is awake, alert, oriented to person,  month, year. Thinks he is in church.  Able to add 5+7, unable to spell world backwards.  Cranial Nerves: II: Visual Fields are full. Pupils are equal, round, and reactive to light.  Discs are sharp. III,IV, VI: EOMI without ptosis or diploplia.  V,VII: Facial sensation and movement are symmetric.  VIII: hearing is intact to voice X: Uvula elevates symmetrically XI: Shoulder shrug is symmetric. XII: tongue is midline without atrophy or fasciculations.  Motor: Patient has diffusely increased tone, in addition, there is multifocal myoclonus. He has 5/5 strneght in upper extremities, and 4+/5 strength in lower extremities.  Sensory: Sensation is symmetric to light touch and temperature in the arms and legs. Deep Tendon Reflexes: 3+ and symmetric in the biceps and patellae. I am not able to ellicit ankle response due to patient's inability to relax.  Cerebellar: FNF with tremor bilaterally Gait: Did not assess 2/2 concern for patient's safety given delirium.   I have reviewed labs in epic and the results pertinent to this consultation are: Ammonia 32 BMP with mild hyponatremia  I have reviewed the images obtained:CT head - no acute findings  His tramdol prescription was filled on 9/30 with 60 pills, currently  he has approximately 25 left.   Impression: 66 yo M with a history of taking more than prescribed tramadol and rigidity, tremor and delusions. This is most consistent with serotonin syndrome, however if his history were to be trusted then he should be clearing up by now without any further anti-serotinergic medications. The multifocal myoclonus seen on exam is very suggestive of a toxic/metabolic cause of his symptoms as could be seen with serotonin syndrome.   I do question his ability to give a reliable history and would confirm with an outside source if possible.  His neck is supple, and he is afebrile with no white count  making an infectious etiology less likely.   Recommendations: 1) D/C zofran as this has been implicated in serotonin syndrome.  2) Hold all serotonergic medications 3) Agree with checking RPR, TSH, B12 4) EEG to assess for spike-wave stupor.  5) ativan prn agitation.     Ritta Slot, MD [facility] [phone]  If 7pm- 7am, please page neurology on call at [phone].

## 2012-07-03 NOTE — Progress Notes (Signed)
Received orders from Dr. Rito Ehrlich to recheck pt's BP and page back with results.   Pt's HR back into the low 90's.  Will continue to monitor pt.  Information relayed to dayshift RN.

## 2012-07-03 NOTE — H&P (Signed)
PCP:   Thayer Headings, MD   Chief Complaint:  Fall  HPI: This is a 66 year old gentleman who apparently has been having difficulty walking for the past 4 days. He's fallen and has been crawling around the home for the past 4 days. A neighbor called 911 today. The patient has some rigidity in all his extremities. In the ER he received Ativan and this did improve. The patient is not on antidepressants. He does report taking tramadol 6 tablets at at a time, probably once or twice weekly for pain. Patient is a poor to fair historian, he has some confusion and is oriented x one to person. History provided most of the patient.  Review of Systems:  The patient denies anorexia, fever, weight loss,, vision loss, decreased hearing, hoarseness, chest pain, syncope, dyspnea on exertion, peripheral edema, balance deficits, hemoptysis, abdominal pain, melena, hematochezia, severe indigestion/heartburn, hematuria, incontinence, genital sores, suspicious skin lesions, transient blindness, depression, unusual weight change, abnormal bleeding, enlarged lymph nodes, angioedema, and breast masses.  Past Medical History: Past Medical History  Diagnosis Date  . Atrial fibrillation   . Hypertension   . Atrial fibrillation   . Coronary artery disease   . CHF (congestive heart failure)     perpheral edema  . Chronic renal disease, stage III   . Joint pain   . Aortic valve insufficiency, rheumatic   . Diabetes mellitus     Was for 10 years, stopped Metformin 2010  . GERD (gastroesophageal reflux disease)   . Blood in stool   . Cardiomyopathy, ischemic, EF 25% by echo 11/28/11. 06/03/2012  . Chronic right-sided HF (heart failure)     ascites   Past Surgical History  Procedure Date  . Tonsillectomy   . Cardiac catheterization   . Esophagogastroduodenoscopy 11/03/2011    Procedure: ESOPHAGOGASTRODUODENOSCOPY (EGD);  Surgeon: Theda Belfast, MD;  Location: [facility];  Service: Endoscopy;  Laterality: N/A;    . Colonoscopy 11/03/2011    Procedure: COLONOSCOPY;  Surgeon: Theda Belfast, MD;  Location: [facility];  Service: Endoscopy;  Laterality: N/A;  . Esophagogastroduodenoscopy 03/22/2012    Procedure: ESOPHAGOGASTRODUODENOSCOPY (EGD);  Surgeon: Theda Belfast, MD;  Location: [facility];  Service: Endoscopy;  Laterality: N/A;  . Esophagogastroduodenoscopy 04/04/2012    Procedure: ESOPHAGOGASTRODUODENOSCOPY (EGD);  Surgeon: Theda Belfast, MD;  Location: [facility];  Service: Endoscopy;  Laterality: N/A;    Medications: Prior to Admission medications   Medication Sig Start Date End Date Taking? Authorizing Provider  aspirin EC 81 MG tablet Take 81 mg by mouth daily.   Yes Historical Provider, MD  cholecalciferol (VITAMIN D) 1000 UNITS tablet Take 1,000 Units by mouth daily.   Yes Historical Provider, MD  docusate sodium (COLACE) 100 MG capsule Take 100 mg by mouth 2 (two) times daily as needed. constipation   Yes Historical Provider, MD  Magnesium Oxide (PHILLIPS) 500 MG (LAX) TABS Take 1 tablet by mouth as needed. Upset stomach   Yes Historical Provider, MD  Multiple Vitamin (MULTIVITAMIN WITH MINERALS) TABS Take 1 tablet by mouth daily.   Yes Historical Provider, MD  pantoprazole (PROTONIX) 40 MG tablet Take 1 tablet (40 mg total) by mouth 2 (two) times daily before a meal. 06/09/12  Yes Ripudeep K Rai, MD  pravastatin (PRAVACHOL) 40 MG tablet Take 40 mg by mouth daily.   Yes Historical Provider, MD  spironolactone (ALDACTONE) 50 MG tablet Take 25 mg by mouth daily.   Yes Historical Provider, MD  torsemide (DEMADEX) 100 MG tablet Take  50 mg by mouth daily.   Yes Historical Provider, MD  traMADol (ULTRAM) 50 MG tablet Take 50 mg by mouth 2 (two) times daily as needed. pain   Yes Historical Provider, MD    Allergies:  No Known Allergies  Social History:  reports that he has never smoked. He has never used smokeless tobacco. He reports that he drinks alcohol. He reports that he does not use  illicit drugs. lives alone at home  Family History: Hypertension  Physical Exam: Filed Vitals:   07/02/12 1719 07/02/12 2300 07/02/12 2359 07/03/12 0100  BP: 126/59 103/49 129/58 128/54  Pulse: 96 71 71 67  Temp: 97.8 F (36.6 C)  98 F (36.7 C)   TempSrc: Oral  Oral   Resp: 20 21 20 21   SpO2: 100% 98% 100% 99%    General:  Alert and oriented times one, well developed and nourished, no acute distress Eyes: PERRLA, pink conjunctiva, no scleral icterus ENT: Moist oral mucosa, neck supple, no thyromegaly Lungs: clear to ascultation, no wheeze, no crackles, no use of accessory muscles Cardiovascular: regular rate and rhythm, no regurgitation, no gallops, no murmurs. No carotid bruits, no JVD Abdomen: soft, positive BS, non-tender, non-distended, no organomegaly, not an acute abdomen GU: not examined Neuro: CN II - XII grossly intact, patient with rigidity in all extremities-mild, tremor right upper extremity-mild, no clonus Musculoskeletal: strength 5/5 all extremities, no clubbing, cyanosis or edema Skin: no rash, no subcutaneous crepitation, no decubitus Psych: Confused but able to give some history   Labs on Admission:   [facility] 07/02/12 1920  NA 130*  K 3.5  CL 93*  CO2 20  GLUCOSE 79  BUN 22  CREATININE 0.80  CALCIUM 9.6  MG 2.0  PHOS --    Basename 07/02/12 1920  AST 27  ALT 14  ALKPHOS 92  BILITOT 1.9*  PROT 7.2  ALBUMIN 2.7*   No results found for this basename: LIPASE:2,AMYLASE:2 in the last 72 hours  Basename 07/02/12 1920  WBC 5.9  NEUTROABS --  HGB 10.1*  HCT 29.9*  MCV 79.9  PLT 240    Basename 07/02/12 1920  CKTOTAL 102  CKMB --  CKMBINDEX --  TROPONINI --  Results for Steve Mccarty, Steve Mccarty (MRN [id number]) as of 07/03/2012 04:12  Ref. Range 06/09/2012 06:25 07/02/2012 19:20  CK Total Latest Range: 7-232 U/L  102  Pro B Natriuretic peptide (BNP) Latest Range: 0-125 pg/mL 2046.0 (H)    No components found with this basename: POCBNP:3 No  results found for this basename: DDIMER:2 in the last 72 hours No results found for this basename: HGBA1C:2 in the last 72 hours No results found for this basename: CHOL:2,HDL:2,LDLCALC:2,TRIG:2,CHOLHDL:2,LDLDIRECT:2 in the last 72 hours No results found for this basename: TSH,T4TOTAL,FREET3,T3FREE,THYROIDAB in the last 72 hours No results found for this basename: VITAMINB12:2,FOLATE:2,FERRITIN:2,TIBC:2,IRON:2,RETICCTPCT:2 in the last 72 hours  Micro Results: No results found for this or any previous visit (from the past 240 hour(s)). Results for Steve Mccarty, Steve Mccarty (MRN [id number]) as of 07/03/2012 04:12  Ref. Range 07/02/2012 22:35  Color, Urine Latest Range: YELLOW  AMBER (A)  APPearance Latest Range: CLEAR  CLEAR  Specific Gravity, Urine Latest Range: 1.005-1.030  1.021  pH Latest Range: 5.0-8.0  5.5  Glucose Latest Range: NEGATIVE mg/dL NEGATIVE  Bilirubin Urine Latest Range: NEGATIVE  MODERATE (A)  Ketones, ur Latest Range: NEGATIVE mg/dL 40 (A)  Protein Latest Range: NEGATIVE mg/dL NEGATIVE  Urobilinogen, UA Latest Range: 0.0-1.0 mg/dL 4.0 (H)  Nitrite Latest Range:  NEGATIVE  NEGATIVE  Leukocytes, UA Latest Range: NEGATIVE  NEGATIVE  Hgb urine dipstick Latest Range: NEGATIVE  NEGATIVE    Radiological Exams on Admission: Ct Head Wo Contrast  07/02/2012  *RADIOLOGY REPORT*  Clinical Data: Recent fall, weakness  CT HEAD WITHOUT CONTRAST  Technique:  Contiguous axial images were obtained from the base of the skull through the vertex without contrast.  Comparison: None.  Findings: The ventricular system is prominent as are the cortical sulci indicative of diffuse atrophy.  Mild small vessel ischemic change is present in the periventricular white matter.  No hemorrhage, mass lesion, or acute infarction is seen.  On bone window images, no calvarial abnormality is seen.  IMPRESSION: Atrophy and small vessel disease.  No acute intracranial abnormality.   Original Report Authenticated By: Juline Patch, M.D.    EKG: Normal sinus rhythm A Assessment/Plan Present on Admission:  Encephalopathy Rigidity Admit to telemetry Unclear etiology, serotonin syndrome in the differential diagnosis. However patient is not on an antidepressant Will treat with Ativan when necessary. Rigidity did improve with Ativan per ER physician Neurology has been consulted Consult physical therapy TSH, RPR, vitamin B12 level checked to further assess patient's encephalopathy Hyponatremia Chronic  Hypertension Coronary artery disease/cardiomyopathy EF 25% Atrial fibrillation Chronic kidney disease stage III Stable home medications renewed   Full code DVT prophylaxis   Steve Mccarty 07/03/2012, 4:08 AM

## 2012-07-03 NOTE — Progress Notes (Signed)
Patient briefly seen and examined. Admitted earlier today with fall, confusion and rigidity. Appreciate neurology input. It appears this is most likely serotonin syndrome. TSH/B12/RPR within normal limits. PT recs CIR vs SNF. Will have CIR see and will also alert SW about possibility of SNF. Will continue to follow.  Peggye Pitt, MD [facility] Pager: [phone]

## 2012-07-03 NOTE — Progress Notes (Signed)
INITIAL ADULT NUTRITION ASSESSMENT Date: 07/03/2012   Time: 4:26 PM Reason for Assessment: Nutrition Risk  ASSESSMENT: Male 66 y.o.  Dx: s/p fall-toxic/metabolic problem related to serotonin syndrome  Hx:  Past Medical History  Diagnosis Date  . Atrial fibrillation   . Hypertension   . Atrial fibrillation   . Coronary artery disease   . CHF (congestive heart failure)     perpheral edema  . Chronic renal disease, stage III   . Joint pain   . Aortic valve insufficiency, rheumatic   . Diabetes mellitus     Was for 10 years, stopped Metformin 2010  . GERD (gastroesophageal reflux disease)   . Blood in stool   . Cardiomyopathy, ischemic, EF 25% by echo 11/28/11. 06/03/2012  . Chronic right-sided HF (heart failure)     ascites   Past Surgical History  Procedure Date  . Tonsillectomy   . Cardiac catheterization   . Esophagogastroduodenoscopy 11/03/2011    Procedure: ESOPHAGOGASTRODUODENOSCOPY (EGD);  Surgeon: Theda Belfast, MD;  Location: [facility];  Service: Endoscopy;  Laterality: N/A;  . Colonoscopy 11/03/2011    Procedure: COLONOSCOPY;  Surgeon: Theda Belfast, MD;  Location: [facility];  Service: Endoscopy;  Laterality: N/A;  . Esophagogastroduodenoscopy 03/22/2012    Procedure: ESOPHAGOGASTRODUODENOSCOPY (EGD);  Surgeon: Theda Belfast, MD;  Location: [facility];  Service: Endoscopy;  Laterality: N/A;  . Esophagogastroduodenoscopy 04/04/2012    Procedure: ESOPHAGOGASTRODUODENOSCOPY (EGD);  Surgeon: Theda Belfast, MD;  Location: [facility];  Service: Endoscopy;  Laterality: N/A;    Related Meds:     . aspirin EC  81 mg Oral Daily  . carvedilol  3.125 mg Oral BID WC  . enoxaparin (LOVENOX) injection  30 mg Subcutaneous Q24H  . HYDROcodone-acetaminophen  2 tablet Oral Once  . lactated ringers  500 mL Intravenous Once  . LORazepam  2 mg Intravenous Once  . pantoprazole  40 mg Oral BID AC  . sodium chloride  3 mL Intravenous Q12H  . spironolactone  25 mg Oral  Daily     Ht: 5\' 8"  (172.7 cm)  Wt: 145 lb (65.772 kg)  Ideal Wt: 70 kg % Ideal Wt: 94  Usual Wt:  Wt Readings from Last 10 Encounters:  07/03/12 145 lb (65.772 kg)  06/08/12 150 lb 9.6 oz (68.312 kg)  03/21/12 150 lb 12.7 oz (68.4 kg)  03/21/12 150 lb 12.7 oz (68.4 kg)  11/03/11 180 lb (81.647 kg)  11/03/11 180 lb (81.647 kg)    % Usual Wt: 97  Body mass index is 22.05 kg/(m^2).   Labs:  CMP     Component Value Date/Time   NA 131* 07/03/2012 0620   K 3.6 07/03/2012 0620   CL 94* 07/03/2012 0620   CO2 21 07/03/2012 0620   GLUCOSE 70 07/03/2012 0620   BUN 20 07/03/2012 0620   CREATININE 0.76 07/03/2012 0620   CALCIUM 9.5 07/03/2012 0620   PROT 7.2 07/02/2012 1920   ALBUMIN 2.7* 07/02/2012 1920   AST 27 07/02/2012 1920   ALT 14 07/02/2012 1920   ALKPHOS 92 07/02/2012 1920   BILITOT 1.9* 07/02/2012 1920   GFRNONAA >90 07/03/2012 0620   GFRAA >90 07/03/2012 0620       Total I/O In: 560 [P.O.:560] Out: 600 [Urine:600]   Diet Order: Cardiac  Supplements/Tube Feeding:  none  IVF:    0.9 % NaCl with KCl 20 mEq / L Last Rate: 75 mL/hr at 07/03/12 0445  DISCONTD: lactated ringers Last Rate: 125 mL/hr  at 07/02/12 2138    Estimated Nutritional Needs:   Kcal: 1800-1950 Protein: 80-90 gm Fluid: 1.8-1.9 L  Food/Nutrition Related Hx: Pt followed regular diet prior to admit with good intake per pt. (canned foods, hot dogs, tv dinners).  Did not use Ensure or Boost. Drank a lot of soda.  Pt reports not eating for 4 days prior to admit secondary to not being able to get up after fall.  Pt  Meets criteria for moderate malnutrition related to acute illness AEB 3% weight loss in the past week, decreased muscle mass per physical exam.    Regular diet with good intake since admit.  Sodium level remains low.    NUTRITION DIAGNOSIS: -Malnutrition (NI-5.2).  Status: Ongoing  RELATED TO: acute illness  AS EVIDENCE BY: weight loss of 3% in the past week, decreased  muscle mass  MONITORING/EVALUATION(Goals): Intake, labs, weight Goal:  Intake of >75% meals.  EDUCATION NEEDS: -No education needs identified at this time  INTERVENTION: Provide preferences.  Encouraged pt to continue eating well.  Dietitian [phone]  DOCUMENTATION CODES Per approved criteria  -Non-severe (moderate) malnutrition in the context of acute illness or injury    Jeoffrey Massed 07/03/2012, 4:26 PM

## 2012-07-03 NOTE — Progress Notes (Signed)
   CARE MANAGEMENT NOTE 07/03/2012  Patient:  Steve Mccarty, Steve Mccarty   Account Number:  [id number]  Date Initiated:  07/03/2012  Documentation initiated by:  Jiles Crocker  Subjective/Objective Assessment:   ADMITTED WITH WEAKNESS, ENCEPHALOPATHY     Action/Plan:   PCP: Thayer Headings, MD    LIVES AT HOME ALONE; AWAITING ON PT/OT EVALS FOR ANY HHC NEEDS   Anticipated DC Date:  07/05/2012   Anticipated DC Plan:  HOME W HOME HEALTH SERVICES      DC Planning Services  CM consult               Status of service:  In process, will continue to follow Medicare Important Message given?  NA - LOS <3 / Initial given by admissions (If response is "NO", the following Medicare IM given date fields will be blank)  Per UR Regulation:  Reviewed for med. necessity/level of care/duration of stay Comments:  07/03/2012- B Duvid Smalls RN, BSN, MHA

## 2012-07-04 MED ORDER — TRAMADOL HCL 50 MG PO TABS
50.0000 mg | ORAL_TABLET | Freq: Three times a day (TID) | ORAL | Status: DC | PRN
  Administered 2012-07-04 – 2012-07-05 (×2): 50 mg via ORAL
  Filled 2012-07-04 (×3): qty 1

## 2012-07-04 MED ORDER — TRAMADOL HCL 50 MG PO TABS
50.0000 mg | ORAL_TABLET | Freq: Three times a day (TID) | ORAL | Status: DC | PRN
  Administered 2012-07-04: 50 mg via ORAL
  Filled 2012-07-04: qty 1

## 2012-07-04 MED ORDER — ENOXAPARIN SODIUM 40 MG/0.4ML ~~LOC~~ SOLN
40.0000 mg | SUBCUTANEOUS | Status: DC
  Administered 2012-07-05: 40 mg via SUBCUTANEOUS
  Filled 2012-07-04: qty 0.4

## 2012-07-04 NOTE — Progress Notes (Signed)
Physical medicine and rehabilitation consult was requested. Patient is currently observation level as noted. Patient did receive a prescreening by rehabilitation admissions coordinator 07/03/2012. Patient at this time does not meet medical necessity for acute hospital and will not meet criteria for CIR. Will hold formal rehabilitation consult at this time

## 2012-07-04 NOTE — Progress Notes (Signed)
Talked to patient about DCP; patient lives alone, has friends that will come by and check on him. Information given to patient on 'Life Alert" - the medical alert pendant that the patient can call for help at a press of a button. HHC choices offered, patient chose [facility] - HHRN/PT/OT/ nurses aide and Soc Worker; DME - shower chair, 3:1 bedside commode. (patient has a walker at home which he does not use and a cane). CM talked to the patient about the importance of using his walker- patient acknowledged understanding. Abelino Derrick RN,BSN,MHA

## 2012-07-04 NOTE — Procedures (Signed)
EEG NUMBER:  REFERRING PHYSICIAN:  Erven Colla, M.D.  HISTORY:  A 65 year old male with altered mental status.  MEDICATIONS:  Aspirin, Coreg, Lovenox, Protonix, Aldactone, Ativan and lactated Ringer's.  CONDITIONS OF RECORDING:  This is a 16-channel EEG carried out with the patient in the awake and drowsy state.  DESCRIPTION:  The patient drowses throughout the majority of the tracing.  The background activity is slow with the mixture of delta and theta rhythms that are poorly organized.  On rare occasions, a posterior background rhythm is obtained that reaches 8 Hz alpha activity.  Stage II sleep is not obtained.  Hyperventilation was not performed secondary to the excessive drowsiness with the patient.  Intermittent photic stimulation failed to elicit any change in the tracing.  IMPRESSION:  This is a normal EEG.  No epileptiform activity was noted. The patient did remain drowsy throughout the majority of the tracing.          ______________________________ Thana Farr, MD    JY:NWGN D:  07/04/2012 56:21:30  T:  07/04/2012 09:15:44  Job #:  [id number]

## 2012-07-04 NOTE — Progress Notes (Signed)
Physical Therapy Treatment Patient Details Name: Steve Mccarty MRN: [id number] DOB: 08-17-1946 Today's Date: 07/04/2012 Time: 4098-1191 PT Time Calculation (min): 20 min  PT Assessment / Plan / Recommendation Comments on Treatment Session  Pt improved in gait today, but still limited by ankle pain.  Will check back later today for continued ambulation and exercise after pain med    Follow Up Recommendations  Home health PT;Supervision - Intermittent     Does the patient have the potential to tolerate intense rehabilitation     Barriers to Discharge        Equipment Recommendations  None recommended by PT    Recommendations for Other Services OT consult  Frequency Min 3X/week   Plan Discharge plan needs to be updated    Precautions / Restrictions     Pertinent Vitals/Pain Pt c/o significant pain in ankles while walking    Mobility  Transfers Transfers: Sit to Stand;Stand to Sit Sit to Stand: From elevated surface;With upper extremity assist;From bed;4: Min assist Stand to Sit: With upper extremity assist;With armrests;To chair/3-in-1;4: Min assist Stand to Sit: Patient Percentage: 60% Details for Transfer Assistance: pt. able to use UEs to push to standing Ambulation/Gait Ambulation/Gait Assistance: 4: Min assist Ambulation Distance (Feet): 15 Feet Assistive device: Rolling walker Ambulation/Gait Assistance Details: pt with increase pain in ankles and feet with increased distance of walking .  Pt requesting pain med Gait Pattern: Wide base of support;Decreased dorsiflexion - left;Decreased weight shift to right;Decreased step length - right;Decreased step length - left Gait velocity: decreased General Gait Details: limited by pain and ankle movement Stairs: No Wheelchair Mobility Wheelchair Mobility: No    Exercises General Exercises - Lower Extremity Ankle Circles/Pumps: AROM;AAROM;10 reps;Both;Seated Long Arc Quad: AROM;Both;10 reps;Seated Hip  Flexion/Marching: AROM;Both;5 reps;Seated   PT Diagnosis:    PT Problem List:   PT Treatment Interventions:     PT Goals Acute Rehab PT Goals PT Goal Formulation: With patient Time For Goal Achievement: 07/10/12 Potential to Achieve Goals: Good Pt will go Supine/Side to Sit: with supervision Pt will go Sit to Supine/Side: with supervision Pt will go Sit to Stand: with supervision PT Goal: Sit to Stand - Progress: Progressing toward goal Pt will go Stand to Sit: with supervision PT Goal: Stand to Sit - Progress: Progressing toward goal Pt will Transfer Bed to Chair/Chair to Bed: with supervision PT Transfer Goal: Bed to Chair/Chair to Bed - Progress: Progressing toward goal Pt will Ambulate: 51 - 150 feet;with min assist;with least restrictive assistive device PT Goal: Ambulate - Progress: Progressing toward goal Pt will Go Up / Down Stairs: 3-5 stairs;with min assist;with least restrictive assistive device  Visit Information  Last PT Received On: 07/04/12 Assistance Needed: +1    Subjective Data  Subjective: ooooo My ankles!!! Patient Stated Goal: to stay independent   Cognition  Overall Cognitive Status: Appears within functional limits for tasks assessed/performed Arousal/Alertness: Awake/alert Orientation Level: Appears intact for tasks assessed Behavior During Session: [facility] for tasks performed    Balance     End of Session PT - End of Session Equipment Utilized During Treatment: Gait belt Activity Tolerance: Patient limited by pain Patient left: in chair;with call bell/phone within reach Nurse Communication: Mobility status   GP     Donnetta Hail 07/04/2012, 1:03 PM

## 2012-07-04 NOTE — Progress Notes (Signed)
Subjective: Patient much improved.  No further myoclonus.  Patient admits to taking excessive amounts of tramadol but reports that it works for his pain.  He is clear about future limits.  He has been restarted on 50mg  TID.  Patient has been ambulating.    Objective: Current vital signs: BP 105/46  Pulse 85  Temp 98.3 F (36.8 C) (Oral)  Resp 18  Ht 5\' 8"  (1.727 m)  Wt 65.772 kg (145 lb)  BMI 22.05 kg/m2  SpO2 99% Vital signs in last 24 hours: Temp:  [98.2 F (36.8 C)-98.3 F (36.8 C)] 98.3 F (36.8 C) (10/24 1325) Pulse Rate:  [76-85] 85  (10/24 1325) Resp:  [18] 18  (10/24 1325) BP: (102-105)/(45-54) 105/46 mmHg (10/24 1325) SpO2:  [99 %-100 %] 99 % (10/24 1325)  Intake/Output from previous day: 10/23 0701 - 10/24 0700 In: 2868.8 [P.O.:920; I.V.:1948.8] Out: 1800 [Urine:1800] Intake/Output this shift:   Nutritional status: Cardiac  Neurologic Exam: Mental Status: Alert, oriented, thought content appropriate.  Speech fluent without evidence of aphasia.  Able to follow 3 step commands without difficulty. Cranial Nerves: II: Discs flat bilaterally; Visual fields grossly normal, pupils equal, round, reactive to light and accommodation III,IV, VI: ptosis not present, extra-ocular motions intact bilaterally V,VII: smile symmetric, facial light touch sensation normal bilaterally VIII: hearing normal bilaterally IX,X: gag reflex present XI: bilateral shoulder shrug XII: midline tongue extension Motor: Right : Upper extremity   5/5    Left:     Upper extremity   5/5  Lower extremity   5/5     Lower extremity   5/5 Tone and bulk:normal tone throughout; no atrophy noted Sensory: Pinprick and light touch intact throughout, bilaterally Deep Tendon Reflexes: 3+ and symmetric throughout   Lab Results: Basic Metabolic Panel:  Lab 07/03/12 1610 07/02/12 1920  NA 131* 130*  K 3.6 3.5  CL 94* 93*  CO2 21 20  GLUCOSE 70 79  BUN 20 22  CREATININE 0.76 0.80  CALCIUM 9.5 9.6    MG -- 2.0  PHOS -- --    Liver Function Tests:  Lab 07/02/12 1920  AST 27  ALT 14  ALKPHOS 92  BILITOT 1.9*  PROT 7.2  ALBUMIN 2.7*   No results found for this basename: LIPASE:5,AMYLASE:5 in the last 168 hours  Lab 07/03/12 0015  AMMONIA 32    CBC:  Lab 07/03/12 0620 07/02/12 1920  WBC 5.0 5.9  NEUTROABS -- --  HGB 10.4* 10.1*  HCT 31.7* 29.9*  MCV 80.9 79.9  PLT 229 240    Cardiac Enzymes:  Lab 07/02/12 1920  CKTOTAL 102  CKMB --  CKMBINDEX --  TROPONINI --    Lipid Panel: No results found for this basename: CHOL:5,TRIG:5,HDL:5,CHOLHDL:5,VLDL:5,LDLCALC:5 in the last 168 hours  CBG:  Lab 07/02/12 2113  GLUCAP 76    Microbiology: Results for orders placed during the hospital encounter of 06/03/12  MRSA PCR SCREENING     Status: Normal   Collection Time   06/03/12  2:44 PM      Component Value Range Status Comment   MRSA by PCR NEGATIVE  NEGATIVE Final     Coagulation Studies: No results found for this basename: LABPROT:5,INR:5 in the last 72 hours  Imaging: Ct Head Wo Contrast  07/02/2012  *RADIOLOGY REPORT*  Clinical Data: Recent fall, weakness  CT HEAD WITHOUT CONTRAST  Technique:  Contiguous axial images were obtained from the base of the skull through the vertex without contrast.  Comparison: None.  Findings: The ventricular system is prominent as are the cortical sulci indicative of diffuse atrophy.  Mild small vessel ischemic change is present in the periventricular white matter.  No hemorrhage, mass lesion, or acute infarction is seen.  On bone window images, no calvarial abnormality is seen.  IMPRESSION: Atrophy and small vessel disease.  No acute intracranial abnormality.   Original Report Authenticated By: Juline Patch, M.D.     Medications:  I have reviewed the patient's current medications. Scheduled:   . aspirin EC  81 mg Oral Daily  . carvedilol  3.125 mg Oral BID WC  . enoxaparin (LOVENOX) injection  30 mg Subcutaneous Q24H  .  enoxaparin (LOVENOX) injection  40 mg Subcutaneous Q24H  . pantoprazole  40 mg Oral BID AC  . sodium chloride  3 mL Intravenous Q12H  . spironolactone  25 mg Oral Daily    Assessment/Plan:  Patient Active Hospital Problem List: Serotonin syndrome (07/03/2012)   Assessment: Patient improved. Clear about Tramadol limits.  Does not take daily but at this time requests to not be discontinued completely from its use against my request that something else be tried.     Plan: No further neurologic intervention is recommended at this time.  If further questions arise, please call or page at that time.  Thank you for allowing neurology to participate in the care of this patient.    LOS: 2 days   Thana Farr, MD [facility] [phone] 07/04/2012  7:11 PM

## 2012-07-04 NOTE — Progress Notes (Signed)
[facility]             Progress Note   Subjective: Mild leg pain. He does not want to go home today.  Objective: Vital signs in last 24 hours: Temp:  [98.2 F (36.8 C)-98.3 F (36.8 C)] 98.3 F (36.8 C) (10/24 1325) Pulse Rate:  [76-85] 85  (10/24 1325) Resp:  [18] 18  (10/24 1325) BP: (102-105)/(45-54) 105/46 mmHg (10/24 1325) SpO2:  [99 %-100 %] 99 % (10/24 1325) Weight change:  Last BM Date: 07/03/12  Intake/Output from previous day: 10/23 0701 - 10/24 0700 In: 2868.8 [P.O.:920; I.V.:1948.8] Out: 1800 [Urine:1800] Total I/O In: 480 [P.O.:480] Out: 400 [Urine:400]   Physical Exam: General: Alert, awake, oriented x3, in no acute distress. HEENT: No bruits, no goiter. Heart: Regular rate and rhythm, without murmurs, rubs, gallops. Lungs: Clear to auscultation bilaterally. Abdomen: Soft, nontender, nondistended, positive bowel sounds. Extremities: No clubbing cyanosis or edema with positive pedal pulses. Neuro: Grossly intact, nonfocal.    Lab Results: Basic Metabolic Panel:  [facility] 07/03/12 0620 07/02/12 1920  NA 131* 130*  K 3.6 3.5  CL 94* 93*  CO2 21 20  GLUCOSE 70 79  BUN 20 22  CREATININE 0.76 0.80  CALCIUM 9.5 9.6  MG -- 2.0  PHOS -- --   Liver Function Tests:  [facility] 07/02/12 1920  AST 27  ALT 14  ALKPHOS 92  BILITOT 1.9*  PROT 7.2  ALBUMIN 2.7*    Basename 07/03/12 0015  AMMONIA 32   CBC:  Basename 07/03/12 0620 07/02/12 1920  WBC 5.0 5.9  NEUTROABS -- --  HGB 10.4* 10.1*  HCT 31.7* 29.9*  MCV 80.9 79.9  PLT 229 240   Cardiac Enzymes:  Basename 07/02/12 1920  CKTOTAL 102  CKMB --  CKMBINDEX --  TROPONINI --   CBG:  Basename 07/02/12 2113  GLUCAP 76   Thyroid Function Tests:  Basename 07/03/12 0620  TSH 1.941  T4TOTAL --  FREET4 --  T3FREE --  THYROIDAB --   Anemia Panel:  Basename 07/03/12 0620  VITAMINB12 1013*  FOLATE 17.7  FERRITIN --  TIBC --  IRON --  RETICCTPCT --   Alcohol  Level:  Basename 07/02/12 1920  ETH <11   Urinalysis:  Basename 07/02/12 2235  COLORURINE AMBER*  LABSPEC 1.021  PHURINE 5.5  GLUCOSEU NEGATIVE  HGBUR NEGATIVE  BILIRUBINUR MODERATE*  KETONESUR 40*  PROTEINUR NEGATIVE  UROBILINOGEN 4.0*  NITRITE NEGATIVE  LEUKOCYTESUR NEGATIVE    Studies/Results: Ct Head Wo Contrast  07/02/2012  *RADIOLOGY REPORT*  Clinical Data: Recent fall, weakness  CT HEAD WITHOUT CONTRAST  Technique:  Contiguous axial images were obtained from the base of the skull through the vertex without contrast.  Comparison: None.  Findings: The ventricular system is prominent as are the cortical sulci indicative of diffuse atrophy.  Mild small vessel ischemic change is present in the periventricular white matter.  No hemorrhage, mass lesion, or acute infarction is seen.  On bone window images, no calvarial abnormality is seen.  IMPRESSION: Atrophy and small vessel disease.  No acute intracranial abnormality.   Original Report Authenticated By: Juline Patch, M.D.     Medications: Scheduled Meds:   . aspirin EC  81 mg Oral Daily  . carvedilol  3.125 mg Oral BID WC  . enoxaparin (LOVENOX) injection  30 mg Subcutaneous Q24H  . enoxaparin (LOVENOX) injection  40 mg Subcutaneous Q24H  . pantoprazole  40 mg Oral BID AC  . sodium chloride  3 mL Intravenous Q12H  . spironolactone  25 mg Oral Daily   Continuous Infusions:   . 0.9 % NaCl with KCl 20 mEq / L 75 mL/hr at 07/04/12 0634   PRN Meds:.acetaminophen, acetaminophen, alum & mag hydroxide-simeth, docusate sodium, LORazepam, traMADol, DISCONTD: traMADol  Assessment/Plan:  Principal Problem:  *Serotonin syndrome Active Problems:  Rigidity (muscles)  Hypertension  Paroxysmal atrial fibrillation  Diabetes mellitus type 2 with complications  Cardiomyopathy, ischemic, EF 25% by echo 11/28/11.  CAD (coronary artery disease)    Serotonin Syndrome -We believe he took too much tramadol. He denies taking any  prior to this event, but according to his prescription he is missing a lot of pills. -Muscle rigidity and weakness improving. -Has continued to have improvement with PT. -Now plan is to go home in am with [facility] therapy.  Rest of chronic medical issues have been stable.   Time spent coordinating care: 20 minutes.   LOS: 2 days   [facility] Pager: [phone] 07/04/2012, 3:54 PM

## 2012-07-04 NOTE — Progress Notes (Signed)
Pt c/o pain in bilateral lower legs. Pt states he takes Tramadol at home for the pian. MD text paged. Awaiting return call. Will continue to monitor.

## 2012-07-04 NOTE — Progress Notes (Signed)
Physical Therapy Treatment Patient Details Name: Steve Mccarty MRN: [id number] DOB: May 15, 1946 Today's Date: 07/04/2012 Time: 7829-5621 PT Time Calculation (min): 21 min  PT Assessment / Plan / Recommendation Comments on Treatment Session  Pt continues progressive improvement. Recommend pt ambulate with nursing and RW as able    Follow Up Recommendations  Home health PT;Supervision - Intermittent     Does the patient have the potential to tolerate intense rehabilitation     Barriers to Discharge        Equipment Recommendations  None recommended by PT    Recommendations for Other Services OT consult  Frequency Min 3X/week   Plan Discharge plan remains appropriate    Precautions / Restrictions Precautions Precautions: Fall Restrictions Weight Bearing Restrictions: No   Pertinent Vitals/Pain Continues to have pain in ankles with weight bearing    Mobility  Bed Mobility Bed Mobility: Sit to Supine Supine to Sit: 5: Supervision Transfers Transfers: Sit to Stand;Stand to Sit Sit to Stand: From elevated surface;With upper extremity assist;5: Supervision;From chair/3-in-1 Stand to Sit: With upper extremity assist;With armrests;To chair/3-in-1;5: Supervision Stand to Sit: Patient Percentage: 60% Details for Transfer Assistance: verbal cues to push up on armrests Ambulation/Gait Ambulation/Gait Assistance: 4: Min guard Ambulation Distance (Feet): 175 Feet (75, 100 with sitting rest break) Assistive device: Rolling walker Ambulation/Gait Assistance Details: pt with increase pain in ankles and feet with increased distance of walking .  Pt requesting pain med Gait Pattern: Wide base of support;Decreased dorsiflexion - left;Decreased weight shift to right;Decreased step length - right;Decreased step length - left Gait velocity: decreased General Gait Details: improved though still with pain in ankles Stairs: No Wheelchair Mobility Wheelchair Mobility: No    Exercises  General Exercises - Lower Extremity Ankle Circles/Pumps: AROM;AAROM;10 reps;Both;Seated Long Arc Quad: AROM;Both;10 reps;Seated Hip Flexion/Marching: AROM;Both;5 reps;Seated   PT Diagnosis:    PT Problem List:   PT Treatment Interventions:     PT Goals Acute Rehab PT Goals PT Goal Formulation: With patient Time For Goal Achievement: 07/10/12 Potential to Achieve Goals: Good Pt will go Supine/Side to Sit: with supervision Pt will go Sit to Supine/Side: with supervision PT Goal: Sit to Supine/Side - Progress: Met Pt will go Sit to Stand: with supervision PT Goal: Sit to Stand - Progress: Met Pt will go Stand to Sit: with supervision PT Goal: Stand to Sit - Progress: Met Pt will Transfer Bed to Chair/Chair to Bed: with supervision PT Transfer Goal: Bed to Chair/Chair to Bed - Progress: Progressing toward goal Pt will Ambulate: 51 - 150 feet;with min assist;with least restrictive assistive device PT Goal: Ambulate - Progress: Progressing toward goal Pt will Go Up / Down Stairs: 3-5 stairs;with min assist;with least restrictive assistive device  Visit Information  Last PT Received On: 07/04/12 Assistance Needed: +1    Subjective Data  Subjective: I didn't get my medicine yet Patient Stated Goal: to go home   Cognition  Overall Cognitive Status: Appears within functional limits for tasks assessed/performed Arousal/Alertness: Awake/alert Orientation Level: Appears intact for tasks assessed Behavior During Session: [facility] for tasks performed    Balance     End of Session PT - End of Session Equipment Utilized During Treatment: Gait belt Activity Tolerance: Patient limited by pain Patient left: in bed Nurse Communication: Mobility status   GP     Rosey Bath K. Manson Passey, [place]  [phone] 07/04/2012, 2:16 PM

## 2012-07-05 DIAGNOSIS — I2510 Atherosclerotic heart disease of native coronary artery without angina pectoris: Secondary | ICD-10-CM

## 2012-07-05 DIAGNOSIS — N179 Acute kidney failure, unspecified: Secondary | ICD-10-CM

## 2012-07-05 DIAGNOSIS — Z7901 Long term (current) use of anticoagulants: Secondary | ICD-10-CM

## 2012-07-05 MED ORDER — CARVEDILOL 3.125 MG PO TABS: 3.1250 mg | ORAL_TABLET | Freq: Two times a day (BID) | ORAL | Status: DC

## 2012-07-05 NOTE — Discharge Summary (Signed)
Physician Discharge Summary  Patient ID: Steve Mccarty MRN: [id number] DOB/AGE: 1946/01/29 66 y.o.  Admit date: 07/02/2012 Discharge date: 07/05/2012  Primary Care Physician:  Thayer Headings, MD   Discharge Diagnoses:    Principal Problem:  *Serotonin syndrome Active Problems:  Rigidity (muscles)  Hypertension  Paroxysmal atrial fibrillation  Diabetes mellitus type 2 with complications  Cardiomyopathy, ischemic, EF 25% by echo 11/28/11.  CAD (coronary artery disease)      Medication List     As of 07/05/2012  4:33 PM    TAKE these medications         aspirin EC 81 MG tablet   Take 81 mg by mouth daily.      carvedilol 3.125 MG tablet   Commonly known as: COREG   Take 1 tablet (3.125 mg total) by mouth 2 (two) times daily with a meal.      cholecalciferol 1000 UNITS tablet   Commonly known as: VITAMIN D   Take 1,000 Units by mouth daily.      docusate sodium 100 MG capsule   Commonly known as: COLACE   Take 100 mg by mouth 2 (two) times daily as needed. constipation      multivitamin with minerals Tabs   Take 1 tablet by mouth daily.      pantoprazole 40 MG tablet   Commonly known as: PROTONIX   Take 1 tablet (40 mg total) by mouth 2 (two) times daily before a meal.      PHILLIPS 500 MG (LAX) Tabs   Generic drug: Magnesium Oxide   Take 1 tablet by mouth as needed. Upset stomach      pravastatin 40 MG tablet   Commonly known as: PRAVACHOL   Take 40 mg by mouth daily.      spironolactone 50 MG tablet   Commonly known as: ALDACTONE   Take 25 mg by mouth daily.      torsemide 100 MG tablet   Commonly known as: DEMADEX   Take 50 mg by mouth daily.      traMADol 50 MG tablet   Commonly known as: ULTRAM   Take 50 mg by mouth 2 (two) times daily as needed. pain         Disposition and Follow-up:  Will be discharged home today in stable and improved condition. Has been instructed to follow up with his PCP in 2 weeks.  Consults:  Neurology, Dr.  Thad Ranger.   Significant Diagnostic Studies:  CT Head 10/22: IMPRESSION:  Atrophy and small vessel disease. No acute intracranial  abnormality.    Brief H and P: For complete details please refer to admission H and P, but in brief patient is a 66 year old gentleman who apparently had been having difficulty walking for the past 4 days. Has fallen and has been crawling around the home for the past 4 days. A neighbor called 911 the day of admission. The patient has some rigidity in all his extremities. In the ER he received Ativan and this did improve. The patient is not on antidepressants. He does report taking tramadol 6 tablets at at a time, probably once or twice weekly for pain. Patient is a poor to fair historian, he has some confusion and is oriented to person. History provided by the patient. We were asked to admit him for further evaluation and management.    Hospital Course:  Principal Problem:  *Serotonin syndrome Active Problems:  Rigidity (muscles)  Hypertension  Paroxysmal atrial fibrillation  Diabetes mellitus type 2 with  complications  Cardiomyopathy, ischemic, EF 25% by echo 11/28/11.  CAD (coronary artery disease)    Muscle Rigidity -Believed 2/2 serotonin syndrome and tramadol overdose as he was missing a large amount of pills from his current prescription. -He quickly improved. -PT is recommending HHPT which has been arranged prior to his discharge home.  Rest of medical issues have been stable.    Time spent on Discharge: Greater than 30 minutes.  SignedChaya Jan [facility] Pager: [phone] 07/05/2012, 4:33 PM

## 2013-03-17 ENCOUNTER — Other Ambulatory Visit: Payer: Self-pay | Admitting: Internal Medicine

## 2013-03-17 NOTE — Telephone Encounter (Signed)
Rx was sent to pharmacy electronically. 

## 2013-03-20 ENCOUNTER — Ambulatory Visit (INDEPENDENT_AMBULATORY_CARE_PROVIDER_SITE_OTHER): Payer: Medicare Other | Admitting: Internal Medicine

## 2013-03-20 ENCOUNTER — Encounter: Payer: Self-pay | Admitting: Internal Medicine

## 2013-03-20 VITALS — BP 110/66 | HR 88 | Ht 67.0 in | Wt 176.0 lb

## 2013-03-20 DIAGNOSIS — I509 Heart failure, unspecified: Secondary | ICD-10-CM

## 2013-03-20 DIAGNOSIS — I2510 Atherosclerotic heart disease of native coronary artery without angina pectoris: Secondary | ICD-10-CM

## 2013-03-20 DIAGNOSIS — I480 Paroxysmal atrial fibrillation: Secondary | ICD-10-CM

## 2013-03-20 DIAGNOSIS — I4891 Unspecified atrial fibrillation: Secondary | ICD-10-CM

## 2013-03-20 DIAGNOSIS — I255 Ischemic cardiomyopathy: Secondary | ICD-10-CM

## 2013-03-20 DIAGNOSIS — R0609 Other forms of dyspnea: Secondary | ICD-10-CM

## 2013-03-20 DIAGNOSIS — I50812 Chronic right heart failure: Secondary | ICD-10-CM

## 2013-03-20 DIAGNOSIS — R0600 Dyspnea, unspecified: Secondary | ICD-10-CM

## 2013-03-20 DIAGNOSIS — I2589 Other forms of chronic ischemic heart disease: Secondary | ICD-10-CM

## 2013-03-20 DIAGNOSIS — Z79899 Other long term (current) drug therapy: Secondary | ICD-10-CM

## 2013-03-20 LAB — BASIC METABOLIC PANEL
BUN: 42 mg/dL — ABNORMAL HIGH (ref 6–23)
CO2: 25 mEq/L (ref 19–32)
Chloride: 100 mEq/L (ref 96–112)
Creat: 1.62 mg/dL — ABNORMAL HIGH (ref 0.50–1.35)

## 2013-03-20 MED ORDER — TORSEMIDE 20 MG PO TABS: 30.0000 mg | ORAL_TABLET | Freq: Every day | ORAL | Status: DC

## 2013-03-20 NOTE — Patient Instructions (Addendum)
Your physician recommends that you return for lab work in today. BNP BMET  Follow up with Dr. Rennis Golden 1 week.   Take 1 and 1/2 tablets of your Torsedmide (30 mg) daily. We have sent a refill to your pharmacy electronically.

## 2013-03-22 ENCOUNTER — Encounter: Payer: Self-pay | Admitting: Internal Medicine

## 2013-03-22 NOTE — Progress Notes (Signed)
OFFICE NOTE  Chief Complaint:  Routine office visit  Primary Care Physician: Thayer Headings, MD  HPI:  Steve Mccarty is a pleasant 67 year old gentleman with a history of atrial fibrillation and ischemic cardiomyopathy, EF less than 25% with biventricular failure. He also had a low-risk Myoview in March 2013. He has had problems with biventricular failure, recently was massively diuresed by me and, unfortunately, developed dehydration and syncope. After stopping his diuretics he responded appropriately and has continued to gain weight since then. He continues to take torsemide just once daily. Overall, he has no complaints and actually feels quite well. He is noted some increase in his weight since he was recently seen by Dr. Salena Saner in the office. Per our records his weight is gone up almost 10 pounds in the last 4 months. He was evaluated for an AICD and the declined.  He does have paroxysmal atrial fibrillation, but is not on Xarelto anymore due to significant GI bleeding. He denies any chest pain but is mildly short of breath with exertion.  PMHx:  Past Medical History  Diagnosis Date  . Atrial fibrillation   . Hypertension   . Atrial fibrillation   . Coronary artery disease   . CHF (congestive heart failure)     perpheral edema  . Chronic renal disease, stage III   . Joint pain   . Aortic valve insufficiency, rheumatic   . Diabetes mellitus     Was for 10 years, stopped Metformin 2010  . GERD (gastroesophageal reflux disease)   . Blood in stool   . Cardiomyopathy, ischemic, EF 25% by echo 11/28/11. 06/03/2012  . Chronic right-sided HF (heart failure)     ascites    Past Surgical History  Procedure Laterality Date  . Tonsillectomy    . Cardiac catheterization    . Esophagogastroduodenoscopy  11/03/2011    Procedure: ESOPHAGOGASTRODUODENOSCOPY (EGD);  Surgeon: Theda Belfast, MD;  Location: [facility];  Service: Endoscopy;  Laterality: N/A;  . Colonoscopy  11/03/2011   Procedure: COLONOSCOPY;  Surgeon: Theda Belfast, MD;  Location: [facility];  Service: Endoscopy;  Laterality: N/A;  . Esophagogastroduodenoscopy  03/22/2012    Procedure: ESOPHAGOGASTRODUODENOSCOPY (EGD);  Surgeon: Theda Belfast, MD;  Location: [facility];  Service: Endoscopy;  Laterality: N/A;  . Esophagogastroduodenoscopy  04/04/2012    Procedure: ESOPHAGOGASTRODUODENOSCOPY (EGD);  Surgeon: Theda Belfast, MD;  Location: [facility];  Service: Endoscopy;  Laterality: N/A;    FAMHx:  No family history on file.  SOCHx:   reports that he has never smoked. He has never used smokeless tobacco. He reports that  drinks alcohol. He reports that he does not use illicit drugs.  ALLERGIES:  No Known Allergies  ROS: A comprehensive review of systems was negative except for: Constitutional: positive for Weight gain Respiratory: positive for dyspnea on exertion Cardiovascular: positive for lower extremity edema  HOME MEDS: Current Outpatient Prescriptions  Medication Sig Dispense Refill  . aspirin EC 81 MG tablet Take 81 mg by mouth daily.      . carvedilol (COREG) 3.125 MG tablet Take 1 tablet (3.125 mg total) by mouth 2 (two) times daily with a meal.  60 tablet  1  . cholecalciferol (VITAMIN D) 1000 UNITS tablet Take 1,000 Units by mouth daily.      Marland Kitchen COLCRYS 0.6 MG tablet Take 1 tablet by mouth daily.      Marland Kitchen docusate sodium (COLACE) 100 MG capsule Take 100 mg by mouth 2 (two) times daily as needed. constipation      .  Magnesium Oxide (PHILLIPS) 500 MG (LAX) TABS Take 1 tablet by mouth as needed. Upset stomach      . Multiple Vitamin (MULTIVITAMIN WITH MINERALS) TABS Take 1 tablet by mouth daily.      . pantoprazole (PROTONIX) 40 MG tablet Take 1 tablet (40 mg total) by mouth 2 (two) times daily before a meal.  60 tablet  3  . potassium chloride (KLOR-CON) 20 MEQ packet Take 1 tablet by mouth every day  30 tablet  6  . pravastatin (PRAVACHOL) 40 MG tablet Take 40 mg by mouth daily.        Marland Kitchen spironolactone (ALDACTONE) 50 MG tablet Take 25 mg by mouth daily.      . traMADol (ULTRAM) 50 MG tablet Take 50 mg by mouth 2 (two) times daily as needed. pain      . ULORIC 40 MG tablet Take 1 tablet by mouth daily.      Marland Kitchen torsemide (DEMADEX) 20 MG tablet Take 1.5 tablets (30 mg total) by mouth daily.  135 tablet  3   No current facility-administered medications for this visit.    LABS/IMAGING: Results for orders placed in visit on 03/20/13 (from the past 48 hour(s))  BASIC METABOLIC PANEL     Status: Abnormal   Collection Time    03/20/13 12:09 PM      Result Value Range   Sodium 136  135 - 145 mEq/L   Potassium 4.4  3.5 - 5.3 mEq/L   Chloride 100  96 - 112 mEq/L   CO2 25  19 - 32 mEq/L   Glucose, Bld 89  70 - 99 mg/dL   BUN 42 (*) 6 - 23 mg/dL   Creat 1.61 (*) 0.96 - 1.35 mg/dL   Calcium 9.4  8.4 - 04.5 mg/dL  BRAIN NATRIURETIC PEPTIDE     Status: Abnormal   Collection Time    03/20/13 12:09 PM      Result Value Range   Brain Natriuretic Peptide 986.6 (*) 0.0 - 100.0 pg/mL   No results found.  VITALS: BP 110/66  Pulse 88  Ht 5\' 7"  (1.702 m)  Wt 176 lb (79.833 kg)  BMI 27.56 kg/m2  EXAM: General appearance: alert and no distress Neck: JVD - 2 cm above sternal notch, no adenopathy, no carotid bruit, supple, symmetrical, trachea midline and thyroid not enlarged, symmetric, no tenderness/mass/nodules Lungs: rales LLL Heart: regular rate and rhythm, S1, S2 normal, no murmur, click, rub or gallop Abdomen: Mildly protuberant, no fluid wave Extremities: edema Trace to 1+ bilateral lower extremity edema Pulses: 2+ and symmetric Skin: Pale, warm Neurologic: Mental status: Alert, oriented, thought content appropriate  EKG: Atrial fibrillation with controlled ventricular response  ASSESSMENT: 1. Paroxysmal atrial fibrillation - not on anticoagulation due to GI bleeding 2. Ischemic cardiomyopathy with EF of 25-30%, [place] Heart Association class II  symptoms 3. Declined AICD 4. Biventricular heart failure 5. Acute on chronic systolic heart failure exacerbation  PLAN: 1.   Steve Mccarty appears to be gaining weight and has some left lower lobe crackles. There is some mild lower extremity edema, I suspect this is decompensated heart failure. He does report being mildly more short of breath. I recommended increasing his torsemide to 60 mg daily, and we'll check a BMP and BNP.  Plan to see him back in a week or 2 to see if his weight is coming down with diuresis.  Chrystie Nose, MD, [facility] Attending Cardiologist [facility]  [place] Behlke C 03/22/2013, 9:44 AM

## 2013-03-25 ENCOUNTER — Encounter: Payer: Self-pay | Admitting: Internal Medicine

## 2013-03-26 ENCOUNTER — Encounter: Payer: Self-pay | Admitting: Internal Medicine

## 2013-03-26 ENCOUNTER — Ambulatory Visit (INDEPENDENT_AMBULATORY_CARE_PROVIDER_SITE_OTHER): Payer: Medicare Other | Admitting: Internal Medicine

## 2013-03-26 VITALS — BP 100/70 | HR 80 | Ht 67.0 in | Wt 172.5 lb

## 2013-03-26 DIAGNOSIS — I2589 Other forms of chronic ischemic heart disease: Secondary | ICD-10-CM

## 2013-03-26 DIAGNOSIS — K922 Gastrointestinal hemorrhage, unspecified: Secondary | ICD-10-CM

## 2013-03-26 DIAGNOSIS — I480 Paroxysmal atrial fibrillation: Secondary | ICD-10-CM

## 2013-03-26 DIAGNOSIS — I509 Heart failure, unspecified: Secondary | ICD-10-CM

## 2013-03-26 DIAGNOSIS — N179 Acute kidney failure, unspecified: Secondary | ICD-10-CM

## 2013-03-26 DIAGNOSIS — I255 Ischemic cardiomyopathy: Secondary | ICD-10-CM

## 2013-03-26 DIAGNOSIS — I4891 Unspecified atrial fibrillation: Secondary | ICD-10-CM

## 2013-03-26 DIAGNOSIS — I50812 Chronic right heart failure: Secondary | ICD-10-CM

## 2013-03-26 LAB — COMPREHENSIVE METABOLIC PANEL
Alkaline Phosphatase: 75 U/L (ref 39–117)
BUN: 54 mg/dL — ABNORMAL HIGH (ref 6–23)
Glucose, Bld: 112 mg/dL — ABNORMAL HIGH (ref 70–99)
Total Bilirubin: 0.6 mg/dL (ref 0.3–1.2)

## 2013-03-26 NOTE — Progress Notes (Signed)
OFFICE NOTE  Chief Complaint:  Routine office visit  Primary Care Physician: Thayer Headings, MD  HPI:  Steve Mccarty is a pleasant 67 year old gentleman with a history of atrial fibrillation and ischemic cardiomyopathy, EF less than 25% with biventricular failure. He also had a low-risk Myoview in March 2013. He has had problems with biventricular failure, recently was massively diuresed by me and, unfortunately, developed dehydration and syncope. After stopping his diuretics he responded appropriately and has continued to gain weight since then. He continues to take torsemide just once daily. Overall, he has no complaints and actually feels quite well. He is noted some increase in his weight since he was recently seen by Dr. Salena Saner in the office. Per our records his weight is gone up almost 10 pounds in the last 4 months. He was evaluated for an AICD and the declined.  He does have paroxysmal atrial fibrillation, but is not on Xarelto anymore due to significant GI bleeding. He denies any chest pain but is mildly short of breath with exertion.  He had laboratory work performed on 7/10 which showed an elevated BNP ~1000 and creatinine had jumped to 1.6.  Physical exam was consistent with heart failure.  I had increased his diuretics at his last visit, especially given his 10 lb. Weight gain recently. I increased his diuretics to 30 mg of torsemide daily and his last visit. His weight is now down 4 pounds. He denies any new symptoms such as shortness of breath.  PMHx:  Past Medical History  Diagnosis Date  . Atrial fibrillation   . Hypertension   . Atrial fibrillation   . Coronary artery disease   . CHF (congestive heart failure)     perpheral edema  . Chronic renal disease, stage III   . Joint pain   . Aortic valve insufficiency, rheumatic   . Diabetes mellitus     Was for 10 years, stopped Metformin 2010  . GERD (gastroesophageal reflux disease)   . Blood in stool   . Cardiomyopathy,  ischemic, EF 25% by echo 11/28/11. 06/03/2012  . Chronic right-sided HF (heart failure)     ascites    Past Surgical History  Procedure Laterality Date  . Tonsillectomy    . Cardiac catheterization    . Esophagogastroduodenoscopy  11/03/2011    Procedure: ESOPHAGOGASTRODUODENOSCOPY (EGD);  Surgeon: Theda Belfast, MD;  Location: [facility];  Service: Endoscopy;  Laterality: N/A;  . Colonoscopy  11/03/2011    Procedure: COLONOSCOPY;  Surgeon: Theda Belfast, MD;  Location: [facility];  Service: Endoscopy;  Laterality: N/A;  . Esophagogastroduodenoscopy  03/22/2012    Procedure: ESOPHAGOGASTRODUODENOSCOPY (EGD);  Surgeon: Theda Belfast, MD;  Location: [facility];  Service: Endoscopy;  Laterality: N/A;  . Esophagogastroduodenoscopy  04/04/2012    Procedure: ESOPHAGOGASTRODUODENOSCOPY (EGD);  Surgeon: Theda Belfast, MD;  Location: [facility];  Service: Endoscopy;  Laterality: N/A;    FAMHx:  No family history on file.  SOCHx:   reports that he has never smoked. He has never used smokeless tobacco. He reports that  drinks alcohol. He reports that he does not use illicit drugs.  ALLERGIES:  No Known Allergies  ROS: A comprehensive review of systems was negative except for: Constitutional: positive for Weight gain Respiratory: positive for dyspnea on exertion Cardiovascular: positive for lower extremity edema  HOME MEDS: Current Outpatient Prescriptions  Medication Sig Dispense Refill  . aspirin EC 81 MG tablet Take 81 mg by mouth daily.      Marland Kitchen  carvedilol (COREG) 3.125 MG tablet Take 1 tablet (3.125 mg total) by mouth 2 (two) times daily with a meal.  60 tablet  1  . cholecalciferol (VITAMIN D) 1000 UNITS tablet Take 1,000 Units by mouth daily.      Marland Kitchen COLCRYS 0.6 MG tablet Take 1 tablet by mouth daily.      Marland Kitchen docusate sodium (COLACE) 100 MG capsule Take 100 mg by mouth 2 (two) times daily as needed. constipation      . Magnesium Oxide (PHILLIPS) 500 MG (LAX) TABS Take 1 tablet  by mouth as needed. Upset stomach      . Multiple Vitamin (MULTIVITAMIN WITH MINERALS) TABS Take 1 tablet by mouth daily.      . pantoprazole (PROTONIX) 40 MG tablet Take 1 tablet (40 mg total) by mouth 2 (two) times daily before a meal.  60 tablet  3  . potassium chloride (KLOR-CON) 20 MEQ packet Take 1 tablet by mouth every day  30 tablet  6  . pravastatin (PRAVACHOL) 40 MG tablet Take 40 mg by mouth daily.      Marland Kitchen spironolactone (ALDACTONE) 50 MG tablet Take 25 mg by mouth daily.      Marland Kitchen torsemide (DEMADEX) 20 MG tablet Take 1.5 tablets (30 mg total) by mouth daily.  135 tablet  3  . traMADol (ULTRAM) 50 MG tablet Take 50 mg by mouth 2 (two) times daily as needed. pain      . ULORIC 40 MG tablet Take 1 tablet by mouth daily.       No current facility-administered medications for this visit.    LABS/IMAGING: No results found for this or any previous visit (from the past 48 hour(s)). No results found.  VITALS: There were no vitals taken for this visit.  EXAM: General appearance: alert and no distress Neck: JVD - 2 cm above sternal notch, no adenopathy, no carotid bruit, supple, symmetrical, trachea midline and thyroid not enlarged, symmetric, no tenderness/mass/nodules Lungs: rales LLL Heart: regular rate and rhythm, S1, S2 normal, no murmur, click, rub or gallop Abdomen: Mildly protuberant, no fluid wave, there appears to be a small decrease in abdominal girth Extremities: edema Trace bilateral lower extremity edema Pulses: 2+ and symmetric Skin: Pale, warm Neurologic: Mental status: Alert, oriented, thought content appropriate  EKG: Atrial fibrillation with controlled ventricular response  ASSESSMENT: 1. Paroxysmal atrial fibrillation - not on anticoagulation due to GI bleeding 2. Ischemic cardiomyopathy with EF of 25-30%, New York Heart Association class II symptoms 3. Declined AICD 4. Biventricular heart failure 5. Acute on chronic systolic heart failure  exacerbation  PLAN: 1.   [name] appears to be gaining weight and has some left lower lobe crackles. There are persistent left lower lobe crackles today, however his weight is down about 4 pounds. I would like to recheck his BNP and BMP today to see if she's had any worsening kidney function or improvement with the increased dose diuretics. We may ask you need to increase his torsemide to 40 mg daily.  I will review his labs and plan to see him back in 2 weeks.  Chrystie Nose, MD, [facility] Attending Cardiologist [facility]  Gracilyn Gunia C 03/26/2013, 9:10 AM

## 2013-03-26 NOTE — Patient Instructions (Signed)
Your physician recommends that you schedule a follow-up appointment in: 2 weeks  Your physician recommends that you return for lab work: CMP today

## 2013-04-08 ENCOUNTER — Encounter: Payer: Self-pay | Admitting: *Deleted

## 2013-04-10 ENCOUNTER — Ambulatory Visit (INDEPENDENT_AMBULATORY_CARE_PROVIDER_SITE_OTHER): Payer: Medicare Other | Admitting: Internal Medicine

## 2013-04-10 ENCOUNTER — Encounter: Payer: Self-pay | Admitting: Internal Medicine

## 2013-04-10 VITALS — BP 110/60 | HR 58 | Ht 67.5 in | Wt 178.5 lb

## 2013-04-10 DIAGNOSIS — I4891 Unspecified atrial fibrillation: Secondary | ICD-10-CM

## 2013-04-10 DIAGNOSIS — I2589 Other forms of chronic ischemic heart disease: Secondary | ICD-10-CM

## 2013-04-10 DIAGNOSIS — I509 Heart failure, unspecified: Secondary | ICD-10-CM

## 2013-04-10 DIAGNOSIS — I480 Paroxysmal atrial fibrillation: Secondary | ICD-10-CM

## 2013-04-10 DIAGNOSIS — I50812 Chronic right heart failure: Secondary | ICD-10-CM

## 2013-04-10 DIAGNOSIS — Z7901 Long term (current) use of anticoagulants: Secondary | ICD-10-CM

## 2013-04-10 DIAGNOSIS — I255 Ischemic cardiomyopathy: Secondary | ICD-10-CM

## 2013-04-10 NOTE — Patient Instructions (Signed)
Follow up in 6 months 

## 2013-04-10 NOTE — Progress Notes (Signed)
OFFICE NOTE  Chief Complaint:  Routine office visit  Primary Care Physician: Thayer Headings, MD  HPI:  Steve Mccarty is a pleasant 67 year old gentleman with a history of atrial fibrillation and ischemic cardiomyopathy, EF less than 25% with biventricular failure. He also had a low-risk Myoview in March 2013. He has had problems with biventricular failure, recently was massively diuresed by me and, unfortunately, developed dehydration and syncope. After stopping his diuretics he responded appropriately and has continued to gain weight since then. He continues to take torsemide just once daily. Overall, he has no complaints and actually feels quite well. He is noted some increase in his weight since he was recently seen by Dr. Salena Saner in the office. Per our records his weight is gone up almost 10 pounds in the last 4 months. He was evaluated for an AICD and the declined.  He does have paroxysmal atrial fibrillation, but is not on Xarelto anymore due to significant GI bleeding. He denies any chest pain but is mildly short of breath with exertion. He had laboratory work performed on 7/10 which showed an elevated BNP ~1000 and creatinine had jumped to 1.6.  Physical exam was consistent with heart failure.  I had increased his diuretics at his last visit, especially given his 10 lb. Weight gain recently. I increased his diuretics to 30 mg of torsemide daily and his last visit. His weight is now down 4 pounds. He denies any new symptoms such as shortness of breath. Laboratory work revealed an increase in creatinine up to 2.06. I then recommended decreasing his torsemide to 20 mg and a referral to nephrology. He reports seeing, I believe Dr. Lowell Guitar at [facility]. The note is not available at this time. He was placed back on 40 mg of torsemide and his weight has come down to about baseline. Overall he is doing fairly well.  PMHx:  Past Medical History  Diagnosis Date  . Atrial fibrillation   .  Hypertension   . Coronary artery disease   . CHF (congestive heart failure)     perpheral edema  . Chronic renal disease, stage III   . Joint pain   . Aortic valve insufficiency, rheumatic   . Diabetes mellitus     Was for 10 years, stopped Metformin 2010  . GERD (gastroesophageal reflux disease)   . Blood in stool   . Cardiomyopathy, ischemic, EF 25% by echo 11/28/11. 06/03/2012  . Chronic right-sided HF (heart failure)     ascites  . History of nuclear stress test 11/28/2011    mild perfusion defect in mid inferior, basal inferolateral & mid inferolateral region; low-risk  . History of Doppler ultrasound 11/28/2011    L ant tibial - occlusive disease with re-constitution at dorsalis pedis    Past Surgical History  Procedure Laterality Date  . Tonsillectomy    . Cardiac catheterization  2006    Dr. Aleen Campi  . Esophagogastroduodenoscopy  11/03/2011    Procedure: ESOPHAGOGASTRODUODENOSCOPY (EGD);  Surgeon: Theda Belfast, MD;  Location: [facility];  Service: Endoscopy;  Laterality: N/A;  . Colonoscopy  11/03/2011    Procedure: COLONOSCOPY;  Surgeon: Theda Belfast, MD;  Location: [facility];  Service: Endoscopy;  Laterality: N/A;  . Esophagogastroduodenoscopy  03/22/2012    Procedure: ESOPHAGOGASTRODUODENOSCOPY (EGD);  Surgeon: Theda Belfast, MD;  Location: [facility];  Service: Endoscopy;  Laterality: N/A;  . Esophagogastroduodenoscopy  04/04/2012    Procedure: ESOPHAGOGASTRODUODENOSCOPY (EGD);  Surgeon: Theda Belfast, MD;  Location: [facility];  Service: Endoscopy;  Laterality: N/A;    FAMHx:  No family history on file.  SOCHx:   reports that he has never smoked. He has never used smokeless tobacco. He reports that  drinks alcohol. He reports that he does not use illicit drugs.  ALLERGIES:  No Known Allergies  ROS: A comprehensive review of systems was negative except for: Constitutional: positive for Weight gain Respiratory: positive for dyspnea on  exertion Cardiovascular: positive for lower extremity edema  HOME MEDS: Current Outpatient Prescriptions  Medication Sig Dispense Refill  . aspirin EC 81 MG tablet Take 81 mg by mouth daily.      . carvedilol (COREG) 3.125 MG tablet Take 1 tablet (3.125 mg total) by mouth 2 (two) times daily with a meal.  60 tablet  1  . cholecalciferol (VITAMIN D) 1000 UNITS tablet Take 1,000 Units by mouth daily.      Marland Kitchen COLCRYS 0.6 MG tablet Take 1 tablet by mouth daily.      Marland Kitchen docusate sodium (COLACE) 100 MG capsule Take 100 mg by mouth 2 (two) times daily as needed. constipation      . Magnesium Oxide (PHILLIPS) 500 MG (LAX) TABS Take 1 tablet by mouth as needed. Upset stomach      . Multiple Vitamin (MULTIVITAMIN WITH MINERALS) TABS Take 1 tablet by mouth daily.      . pantoprazole (PROTONIX) 40 MG tablet Take 1 tablet (40 mg total) by mouth 2 (two) times daily before a meal.  60 tablet  3  . potassium chloride (KLOR-CON) 20 MEQ packet Take 1 tablet by mouth every day  30 tablet  6  . pravastatin (PRAVACHOL) 40 MG tablet Take 40 mg by mouth daily.      Marland Kitchen spironolactone (ALDACTONE) 50 MG tablet Take 25 mg by mouth daily.      Marland Kitchen torsemide (DEMADEX) 20 MG tablet Take 2(20mg )tablets  By mouth a day      . traMADol (ULTRAM) 50 MG tablet Take 50 mg by mouth 2 (two) times daily as needed. pain      . ULORIC 40 MG tablet Take 1 tablet by mouth daily.       No current facility-administered medications for this visit.    LABS/IMAGING: No results found for this or any previous visit (from the past 48 hour(s)). No results found.  VITALS: BP 110/60  Pulse 58  Ht 5' 7.5" (1.715 m)  Wt 178 lb 8 oz (80.967 kg)  BMI 27.53 kg/m2  EXAM: General appearance: alert and no distress Neck: JVD - 2 cm above sternal notch, no adenopathy, no carotid bruit, supple, symmetrical, trachea midline and thyroid not enlarged, symmetric, no tenderness/mass/nodules Lungs: rales LLL Heart: regular rate and rhythm, S1, S2  normal, 2/6 SEM at RUSB, click, rub or gallop Abdomen: Mildly protuberant, no fluid wave, there appears to be a small decrease in abdominal girth Extremities: edema Trace bilateral lower extremity edema Pulses: 2+ and symmetric Skin: Pale, warm Neurologic: Mental status: Alert, oriented, thought content appropriate  EKG: Deferred  ASSESSMENT: 1. Paroxysmal atrial fibrillation - not on anticoagulation due to GI bleeding 2. Ischemic cardiomyopathy with EF of 25-30%, New York Heart Association class II symptoms 3. Declined AICD 4. Biventricular heart failure 5. Acute on chronic systolic heart failure exacerbation  PLAN: 1.   [name] looks pretty good today. His weight is back to baseline on torsemide 40 mg daily. He saw Dr. Lowell Guitar at [facility] and he will be following him for his  reduced GFR. We'll plan to see him back in 6 months he should continue to keep close tabs on his weight and contact us if there is any significant increase.  Chrystie Nose, MD, [facility] Attending Cardiologist [facility]  Dhilan Brauer C 04/10/2013, 8:58 AM

## 2013-06-07 ENCOUNTER — Inpatient Hospital Stay (HOSPITAL_COMMUNITY)
Admission: EM | Admit: 2013-06-07 | Discharge: 2013-06-11 | DRG: 291 | Disposition: A | Discharge status: Other Acute Inpatient Hospital | Payer: Medicare Other | Attending: Internal Medicine | Admitting: Internal Medicine

## 2013-06-07 ENCOUNTER — Emergency Department (HOSPITAL_COMMUNITY): Payer: Medicare Other

## 2013-06-07 ENCOUNTER — Inpatient Hospital Stay (HOSPITAL_COMMUNITY): Payer: Medicare Other

## 2013-06-07 ENCOUNTER — Encounter (HOSPITAL_COMMUNITY): Payer: Self-pay | Admitting: Emergency Medicine

## 2013-06-07 DIAGNOSIS — Z7982 Long term (current) use of aspirin: Secondary | ICD-10-CM

## 2013-06-07 DIAGNOSIS — E872 Acidosis, unspecified: Secondary | ICD-10-CM | POA: Diagnosis present

## 2013-06-07 DIAGNOSIS — E8729 Other acidosis: Secondary | ICD-10-CM | POA: Diagnosis present

## 2013-06-07 DIAGNOSIS — M109 Gout, unspecified: Secondary | ICD-10-CM | POA: Diagnosis present

## 2013-06-07 DIAGNOSIS — I959 Hypotension, unspecified: Secondary | ICD-10-CM | POA: Diagnosis present

## 2013-06-07 DIAGNOSIS — I4891 Unspecified atrial fibrillation: Secondary | ICD-10-CM | POA: Diagnosis present

## 2013-06-07 DIAGNOSIS — Z79899 Other long term (current) drug therapy: Secondary | ICD-10-CM

## 2013-06-07 DIAGNOSIS — I480 Paroxysmal atrial fibrillation: Secondary | ICD-10-CM | POA: Diagnosis present

## 2013-06-07 DIAGNOSIS — K922 Gastrointestinal hemorrhage, unspecified: Secondary | ICD-10-CM | POA: Diagnosis present

## 2013-06-07 DIAGNOSIS — I10 Essential (primary) hypertension: Secondary | ICD-10-CM | POA: Diagnosis present

## 2013-06-07 DIAGNOSIS — E878 Other disorders of electrolyte and fluid balance, not elsewhere classified: Secondary | ICD-10-CM

## 2013-06-07 DIAGNOSIS — I255 Ischemic cardiomyopathy: Secondary | ICD-10-CM | POA: Diagnosis present

## 2013-06-07 DIAGNOSIS — K219 Gastro-esophageal reflux disease without esophagitis: Secondary | ICD-10-CM | POA: Diagnosis present

## 2013-06-07 DIAGNOSIS — I5023 Acute on chronic systolic (congestive) heart failure: Principal | ICD-10-CM | POA: Diagnosis present

## 2013-06-07 DIAGNOSIS — K5730 Diverticulosis of large intestine without perforation or abscess without bleeding: Secondary | ICD-10-CM | POA: Diagnosis present

## 2013-06-07 DIAGNOSIS — I50812 Chronic right heart failure: Secondary | ICD-10-CM | POA: Diagnosis present

## 2013-06-07 DIAGNOSIS — E118 Type 2 diabetes mellitus with unspecified complications: Secondary | ICD-10-CM | POA: Diagnosis present

## 2013-06-07 DIAGNOSIS — K648 Other hemorrhoids: Secondary | ICD-10-CM | POA: Diagnosis present

## 2013-06-07 DIAGNOSIS — I2589 Other forms of chronic ischemic heart disease: Secondary | ICD-10-CM

## 2013-06-07 DIAGNOSIS — J9600 Acute respiratory failure, unspecified whether with hypoxia or hypercapnia: Secondary | ICD-10-CM | POA: Diagnosis present

## 2013-06-07 DIAGNOSIS — I2510 Atherosclerotic heart disease of native coronary artery without angina pectoris: Secondary | ICD-10-CM | POA: Diagnosis present

## 2013-06-07 DIAGNOSIS — K644 Residual hemorrhoidal skin tags: Secondary | ICD-10-CM | POA: Diagnosis present

## 2013-06-07 DIAGNOSIS — E8779 Other fluid overload: Secondary | ICD-10-CM | POA: Diagnosis present

## 2013-06-07 DIAGNOSIS — I129 Hypertensive chronic kidney disease with stage 1 through stage 4 chronic kidney disease, or unspecified chronic kidney disease: Secondary | ICD-10-CM | POA: Diagnosis present

## 2013-06-07 DIAGNOSIS — E871 Hypo-osmolality and hyponatremia: Secondary | ICD-10-CM | POA: Diagnosis present

## 2013-06-07 DIAGNOSIS — Z791 Long term (current) use of non-steroidal anti-inflammatories (NSAID): Secondary | ICD-10-CM

## 2013-06-07 DIAGNOSIS — I509 Heart failure, unspecified: Secondary | ICD-10-CM | POA: Diagnosis present

## 2013-06-07 DIAGNOSIS — N183 Chronic kidney disease, stage 3 unspecified: Secondary | ICD-10-CM | POA: Diagnosis present

## 2013-06-07 DIAGNOSIS — R0902 Hypoxemia: Secondary | ICD-10-CM | POA: Diagnosis present

## 2013-06-07 DIAGNOSIS — J9601 Acute respiratory failure with hypoxia: Secondary | ICD-10-CM | POA: Diagnosis present

## 2013-06-07 DIAGNOSIS — R197 Diarrhea, unspecified: Secondary | ICD-10-CM | POA: Diagnosis present

## 2013-06-07 DIAGNOSIS — I359 Nonrheumatic aortic valve disorder, unspecified: Secondary | ICD-10-CM | POA: Diagnosis present

## 2013-06-07 LAB — CBC WITH DIFFERENTIAL/PLATELET
Basophils Absolute: 0 10*3/uL (ref 0.0–0.1)
Basophils Relative: 1 % (ref 0–1)
Hemoglobin: 11.9 g/dL — ABNORMAL LOW (ref 13.0–17.0)
MCHC: 31.9 g/dL (ref 30.0–36.0)
Monocytes Relative: 15 % — ABNORMAL HIGH (ref 3–12)
Neutro Abs: 3.2 10*3/uL (ref 1.7–7.7)
Neutrophils Relative %: 60 % (ref 43–77)
Platelets: 164 10*3/uL (ref 150–400)

## 2013-06-07 LAB — CBC
Hemoglobin: 11.8 g/dL — ABNORMAL LOW (ref 13.0–17.0)
MCH: 23 pg — ABNORMAL LOW (ref 26.0–34.0)
MCV: 71.7 fL — ABNORMAL LOW (ref 78.0–100.0)
Platelets: 145 10*3/uL — ABNORMAL LOW (ref 150–400)
RBC: 5.13 MIL/uL (ref 4.22–5.81)
WBC: 4.9 10*3/uL (ref 4.0–10.5)

## 2013-06-07 LAB — COMPREHENSIVE METABOLIC PANEL
ALT: 22 U/L (ref 0–53)
CO2: 12 mEq/L — ABNORMAL LOW (ref 19–32)
Calcium: 8.9 mg/dL (ref 8.4–10.5)
Creatinine, Ser: 1.51 mg/dL — ABNORMAL HIGH (ref 0.50–1.35)
GFR calc Af Amer: 53 mL/min — ABNORMAL LOW (ref >90)
GFR calc non Af Amer: 46 mL/min — ABNORMAL LOW (ref >90)
Glucose, Bld: 161 mg/dL — ABNORMAL HIGH (ref 70–99)
Total Bilirubin: 1.4 mg/dL — ABNORMAL HIGH (ref 0.3–1.2)

## 2013-06-07 LAB — BLOOD GAS, ARTERIAL
Acid-base deficit: 12.5 mmol/L — ABNORMAL HIGH (ref 0.0–2.0)
Bicarbonate: 10.4 mEq/L — ABNORMAL LOW (ref 20.0–24.0)
Drawn by: 257701
O2 Saturation: 96 %
TCO2: 9.5 mmol/L (ref 0–100)
pCO2 arterial: 17.3 mmHg — CL (ref 35.0–45.0)
pO2, Arterial: 85.6 mmHg (ref 80.0–100.0)

## 2013-06-07 LAB — CREATININE, SERUM
GFR calc Af Amer: 57 mL/min — ABNORMAL LOW (ref >90)
GFR calc non Af Amer: 49 mL/min — ABNORMAL LOW (ref >90)

## 2013-06-07 LAB — TROPONIN I
Troponin I: <0.3 ng/mL (ref <0.30)
Troponin I: <0.3 ng/mL (ref <0.30)

## 2013-06-07 LAB — TSH: TSH: 5.247 u[IU]/mL — ABNORMAL HIGH (ref 0.350–4.500)

## 2013-06-07 LAB — HEMOGLOBIN A1C: Hgb A1c MFr Bld: 6.8 % — ABNORMAL HIGH (ref <5.7)

## 2013-06-07 MED ORDER — ACETAMINOPHEN 650 MG RE SUPP: 650.0000 mg | Freq: Four times a day (QID) | RECTAL | Status: DC | PRN

## 2013-06-07 MED ORDER — IOHEXOL 350 MG/ML SOLN
100.0000 mL | Freq: Once | INTRAVENOUS | Status: AC | PRN
  Administered 2013-06-07: 11:00:00 100 mL via INTRAVENOUS

## 2013-06-07 MED ORDER — ENOXAPARIN SODIUM 40 MG/0.4ML ~~LOC~~ SOLN
40.0000 mg | Freq: Every day | SUBCUTANEOUS | Status: DC
  Administered 2013-06-07 – 2013-06-11 (×5): 40 mg via SUBCUTANEOUS
  Filled 2013-06-07 (×5): qty 0.4

## 2013-06-07 MED ORDER — SODIUM CHLORIDE 0.9 % IV BOLUS (SEPSIS)
1000.0000 mL | Freq: Once | INTRAVENOUS | Status: AC
  Administered 2013-06-07: 1000 mL via INTRAVENOUS

## 2013-06-07 MED ORDER — ADULT MULTIVITAMIN W/MINERALS CH
1.0000 | ORAL_TABLET | Freq: Every morning | ORAL | Status: DC
  Administered 2013-06-07 – 2013-06-11 (×4): 1 via ORAL
  Filled 2013-06-07 (×5): qty 1

## 2013-06-07 MED ORDER — PANTOPRAZOLE SODIUM 40 MG PO TBEC
40.0000 mg | DELAYED_RELEASE_TABLET | Freq: Two times a day (BID) | ORAL | Status: DC
  Administered 2013-06-07 – 2013-06-11 (×8): 40 mg via ORAL
  Filled 2013-06-07 (×12): qty 1

## 2013-06-07 MED ORDER — DILTIAZEM HCL 100 MG IV SOLR
5.0000 mg/h | INTRAVENOUS | Status: DC
  Administered 2013-06-07: 5 mg/h via INTRAVENOUS
  Filled 2013-06-07: qty 100

## 2013-06-07 MED ORDER — SODIUM CHLORIDE 0.9 % IV BOLUS (SEPSIS)
500.0000 mL | Freq: Once | INTRAVENOUS | Status: AC
  Administered 2013-06-07: 500 mL via INTRAVENOUS

## 2013-06-07 MED ORDER — DILTIAZEM LOAD VIA INFUSION
10.0000 mg | Freq: Once | INTRAVENOUS | Status: AC
  Administered 2013-06-07: 10 mg via INTRAVENOUS
  Filled 2013-06-07: qty 10

## 2013-06-07 MED ORDER — SODIUM CHLORIDE 0.9 % IJ SOLN
3.0000 mL | INTRAMUSCULAR | Status: DC | PRN
  Administered 2013-06-07: 17:00:00 3 mL via INTRAVENOUS

## 2013-06-07 MED ORDER — POTASSIUM CHLORIDE CRYS ER 20 MEQ PO TBCR
20.0000 meq | EXTENDED_RELEASE_TABLET | Freq: Every morning | ORAL | Status: DC
  Filled 2013-06-07: qty 1

## 2013-06-07 MED ORDER — DILTIAZEM HCL 30 MG PO TABS
30.0000 mg | ORAL_TABLET | Freq: Three times a day (TID) | ORAL | Status: DC
  Administered 2013-06-07 – 2013-06-10 (×6): 30 mg via ORAL
  Filled 2013-06-07 (×15): qty 1

## 2013-06-07 MED ORDER — VITAMIN D3 25 MCG (1000 UNIT) PO TABS
1000.0000 [IU] | ORAL_TABLET | Freq: Every morning | ORAL | Status: DC
  Administered 2013-06-07 – 2013-06-11 (×4): 1000 [IU] via ORAL
  Filled 2013-06-07 (×5): qty 1

## 2013-06-07 MED ORDER — ASPIRIN EC 81 MG PO TBEC
81.0000 mg | DELAYED_RELEASE_TABLET | Freq: Every morning | ORAL | Status: DC
  Administered 2013-06-07 – 2013-06-11 (×5): 81 mg via ORAL
  Filled 2013-06-07 (×5): qty 1

## 2013-06-07 MED ORDER — SIMVASTATIN 5 MG PO TABS
5.0000 mg | ORAL_TABLET | Freq: Every day | ORAL | Status: DC
  Administered 2013-06-07 – 2013-06-10 (×4): 5 mg via ORAL
  Filled 2013-06-07 (×5): qty 1

## 2013-06-07 MED ORDER — DIPHENHYDRAMINE HCL 25 MG PO CAPS
25.0000 mg | ORAL_CAPSULE | Freq: Once | ORAL | Status: AC
  Administered 2013-06-07: 25 mg via ORAL
  Filled 2013-06-07: qty 1

## 2013-06-07 MED ORDER — SODIUM CHLORIDE 0.9 % IV SOLN: 250.0000 mL | INTRAVENOUS | Status: DC | PRN

## 2013-06-07 MED ORDER — ONDANSETRON HCL 4 MG/2ML IJ SOLN: 4.0000 mg | Freq: Four times a day (QID) | INTRAMUSCULAR | Status: DC | PRN

## 2013-06-07 MED ORDER — SODIUM CHLORIDE 0.9 % IJ SOLN
3.0000 mL | Freq: Two times a day (BID) | INTRAMUSCULAR | Status: DC
  Administered 2013-06-07 – 2013-06-10 (×6): 3 mL via INTRAVENOUS

## 2013-06-07 MED ORDER — SODIUM CHLORIDE 0.9 % IJ SOLN
3.0000 mL | Freq: Two times a day (BID) | INTRAMUSCULAR | Status: DC
  Administered 2013-06-07 – 2013-06-11 (×3): 3 mL via INTRAVENOUS

## 2013-06-07 MED ORDER — ACETAMINOPHEN 325 MG PO TABS
650.0000 mg | ORAL_TABLET | Freq: Four times a day (QID) | ORAL | Status: DC | PRN
  Administered 2013-06-07: 650 mg via ORAL
  Filled 2013-06-07: qty 2

## 2013-06-07 MED ORDER — SODIUM CHLORIDE 0.9 % IV SOLN: INTRAVENOUS | Status: DC

## 2013-06-07 MED ORDER — ONDANSETRON HCL 4 MG PO TABS: 4.0000 mg | ORAL_TABLET | Freq: Four times a day (QID) | ORAL | Status: DC | PRN

## 2013-06-07 MED ORDER — INFLUENZA VAC SPLIT QUAD 0.5 ML IM SUSP
0.5000 mL | INTRAMUSCULAR | Status: AC
  Administered 2013-06-08: 0.5 mL via INTRAMUSCULAR
  Filled 2013-06-07 (×2): qty 0.5

## 2013-06-07 MED ORDER — TORSEMIDE 20 MG PO TABS
40.0000 mg | ORAL_TABLET | Freq: Every morning | ORAL | Status: DC
  Filled 2013-06-07: qty 2

## 2013-06-07 MED ORDER — CARVEDILOL 3.125 MG PO TABS
3.1250 mg | ORAL_TABLET | Freq: Two times a day (BID) | ORAL | Status: DC
  Administered 2013-06-07 – 2013-06-09 (×3): 3.125 mg via ORAL
  Filled 2013-06-07 (×6): qty 1

## 2013-06-07 NOTE — Consult Note (Signed)
Reason for Consult: SOB  Requesting Physician: [facility]  HPI:  The patient is a pleasant 67 year old male who is followed by Dr. Rennis Golden. He has a history of PAF. He has a history of cardiomyopathy with an EF of less than 25% as well as a moderately dilated RV and moderately reduced RV function. He declined ICD therapy. Remote cath by Dr Aleen Campi in 2006 showed no significant CAD according to the patient. He had a low-risk Myoview in March 2013. He was admitted for diverticular bleed in July 2013. His anticoagulation was stopped.  He has had problems with heart failure, both right heart failure and left heart failure. He was admitted to the hospital June 03, 2012, with syncope. He was found to be dehydrated. He last saw Dr Rennis Golden 04/10/13. At that time he seemed stable with a weight of 178 lbs. His last office EKG shows NSR 03/20/13.             He was admitted this am with complaints of weakness, diarrhea ("for months"), and SOB. He says he has been having loose stools for several months. He has gout and he attributed this to his gout medicine  He has been seeing Dr Elnoria Howard for this.  Basically he has gradually declined. He lives alone. He says he has been "feeling like I'm going to die" for days and he decided to come to the hospital this am. In the ER he was hypotensive and hypoxic which responded to IVF and O2.  He was reportedly in rapid AF but I can find no EKG or telemetry strips, he is AF now with a VR of 110 and comfortable. He denies chest pain, hemoptysis, or calf edema or pain.             PMHx:  Past Medical History  Diagnosis Date  . Atrial fibrillation   . Hypertension   . Coronary artery disease   . CHF (congestive heart failure)     perpheral edema  . Chronic renal disease, stage III   . Joint pain   . Aortic valve insufficiency, rheumatic   . Diabetes mellitus     Was for 10 years, stopped Metformin 2010  . GERD (gastroesophageal reflux disease)   . Blood in stool   .  Cardiomyopathy, ischemic, EF 25% by echo 11/28/11. 06/03/2012  . Chronic right-sided HF (heart failure)     ascites  . History of nuclear stress test 11/28/2011    mild perfusion defect in mid inferior, basal inferolateral & mid inferolateral region; low-risk  . History of Doppler ultrasound 11/28/2011    L ant tibial - occlusive disease with re-constitution at dorsalis pedis   Past Surgical History  Procedure Laterality Date  . Tonsillectomy    . Cardiac catheterization  2006    Dr. Aleen Campi  . Esophagogastroduodenoscopy  11/03/2011    Procedure: ESOPHAGOGASTRODUODENOSCOPY (EGD);  Surgeon: Theda Belfast, MD;  Location: [facility];  Service: Endoscopy;  Laterality: N/A;  . Colonoscopy  11/03/2011    Procedure: COLONOSCOPY;  Surgeon: Theda Belfast, MD;  Location: [facility];  Service: Endoscopy;  Laterality: N/A;  . Esophagogastroduodenoscopy  03/22/2012    Procedure: ESOPHAGOGASTRODUODENOSCOPY (EGD);  Surgeon: Theda Belfast, MD;  Location: [facility];  Service: Endoscopy;  Laterality: N/A;  . Esophagogastroduodenoscopy  04/04/2012    Procedure: ESOPHAGOGASTRODUODENOSCOPY (EGD);  Surgeon: Theda Belfast, MD;  Location: [facility];  Service: Endoscopy;  Laterality: N/A;    FAMHx: Mother had CHF, sister had cancer  SOCHx:  reports that he has never smoked. He has never used smokeless tobacco. He reports that  drinks alcohol. He reports that he does not use illicit drugs.  ALLERGIES: No Known Allergies  ROS: Pertinent items are noted in HPI. see admission H&P for complete details.  HOME MEDICATIONS: Prescriptions prior to admission  Medication Sig Dispense Refill  . aspirin EC 81 MG tablet Take 81 mg by mouth every morning.       . carvedilol (COREG) 3.125 MG tablet Take 1 tablet (3.125 mg total) by mouth 2 (two) times daily with a meal.  60 tablet  1  . cholecalciferol (VITAMIN D) 1000 UNITS tablet Take 1,000 Units by mouth every morning.       . docusate sodium (COLACE)  100 MG capsule Take 100 mg by mouth 2 (two) times daily as needed for constipation. constipation      . Magnesium Oxide (PHILLIPS) 500 MG (LAX) TABS Take 1 tablet by mouth daily as needed (for upset stomach). Upset stomach      . Multiple Vitamin (MULTIVITAMIN WITH MINERALS) TABS Take 1 tablet by mouth every morning.       . pantoprazole (PROTONIX) 40 MG tablet Take 1 tablet (40 mg total) by mouth 2 (two) times daily before a meal.  60 tablet  3  . potassium chloride SA (K-DUR,KLOR-CON) 20 MEQ tablet Take 20 mEq by mouth every morning.      . pravastatin (PRAVACHOL) 40 MG tablet Take 40 mg by mouth every evening.       Marland Kitchen spironolactone (ALDACTONE) 50 MG tablet Take 25 mg by mouth every morning.       . torsemide (DEMADEX) 20 MG tablet Take 40 mg by mouth every morning. Take 2(20mg )tablets  By mouth a day      . traMADol (ULTRAM) 50 MG tablet Take 50 mg by mouth 2 (two) times daily as needed for pain. pain        HOSPITAL MEDICATIONS: I have reviewed the patient's current medications.  VITALS: Blood pressure 119/77, pulse 128, temperature 97.5 F (36.4 C), temperature source Axillary, resp. rate 28, height 5\' 8"  (1.727 m), weight 181 lb (82.1 kg), SpO2 89.00%.  PHYSICAL EXAM: General appearance: alert, cooperative and no distress Neck: no carotid bruit and no JVD Lungs: clear to auscultation bilaterally Heart: irregularly irregular rhythm Abdomen: soft, non-tender; bowel sounds normal; no masses,  no organomegaly Extremities: no edema Pulses: 2+ and symmetric Skin: pale, cool, and dry Neurologic: Grossly normal  LABS: Results for orders placed during the hospital encounter of 06/07/13 (from the past 48 hour(s))  COMPREHENSIVE METABOLIC PANEL     Status: Abnormal   Collection Time    06/07/13  5:15 AM      Result Value Range   Sodium 124 (*) 135 - 145 mEq/L   Comment: REPEATED TO VERIFY   Potassium 3.7  3.5 - 5.1 mEq/L   Chloride 91 (*) 96 - 112 mEq/L   Comment: REPEATED TO  VERIFY   CO2 12 (*) 19 - 32 mEq/L   Comment: REPEATED TO VERIFY   Glucose, Bld 161 (*) 70 - 99 mg/dL   BUN 23  6 - 23 mg/dL   Creatinine, Ser 1.61 (*) 0.50 - 1.35 mg/dL   Calcium 8.9  8.4 - 09.6 mg/dL   Total Protein 7.3  6.0 - 8.3 g/dL   Albumin 3.8  3.5 - 5.2 g/dL   AST 37  0 - 37 U/L   ALT 22  0 - 53 U/L   Alkaline Phosphatase 113  39 - 117 U/L   Total Bilirubin 1.4 (*) 0.3 - 1.2 mg/dL   GFR calc non Af Amer 46 (*) >90 mL/min   GFR calc Af Amer 53 (*) >90 mL/min   Comment: (NOTE)     The eGFR has been calculated using the CKD EPI equation.     This calculation has not been validated in all clinical situations.     eGFR's persistently <90 mL/min signify possible Chronic Kidney     Disease.  CBC WITH DIFFERENTIAL     Status: Abnormal   Collection Time    06/07/13  5:15 AM      Result Value Range   WBC 5.3  4.0 - 10.5 K/uL   RBC 5.15  4.22 - 5.81 MIL/uL   Hemoglobin 11.9 (*) 13.0 - 17.0 g/dL   HCT 16.1 (*) 09.6 - 04.5 %   MCV 72.4 (*) 78.0 - 100.0 fL   MCH 23.1 (*) 26.0 - 34.0 pg   MCHC 31.9  30.0 - 36.0 g/dL   RDW 40.9 (*) 81.1 - 91.4 %   Platelets 164  150 - 400 K/uL   Neutrophils Relative % 60  43 - 77 %   Neutro Abs 3.2  1.7 - 7.7 K/uL   Lymphocytes Relative 25  12 - 46 %   Lymphs Abs 1.3  0.7 - 4.0 K/uL   Monocytes Relative 15 (*) 3 - 12 %   Monocytes Absolute 0.8  0.1 - 1.0 K/uL   Eosinophils Relative 0  0 - 5 %   Eosinophils Absolute 0.0  0.0 - 0.7 K/uL   Basophils Relative 1  0 - 1 %   Basophils Absolute 0.0  0.0 - 0.1 K/uL  TROPONIN I     Status: None   Collection Time    06/07/13  5:15 AM      Result Value Range   Troponin I <0.30  <0.30 ng/mL   Comment:            Due to the release kinetics of cTnI,     a negative result within the first hours     of the onset of symptoms does not rule out     myocardial infarction with certainty.     If myocardial infarction is still suspected,     repeat the test at appropriate intervals.  CBC     Status: Abnormal    Collection Time    06/07/13 10:19 AM      Result Value Range   WBC 4.9  4.0 - 10.5 K/uL   RBC 5.13  4.22 - 5.81 MIL/uL   Hemoglobin 11.8 (*) 13.0 - 17.0 g/dL   HCT 78.2 (*) 95.6 - 21.3 %   MCV 71.7 (*) 78.0 - 100.0 fL   MCH 23.0 (*) 26.0 - 34.0 pg   MCHC 32.1  30.0 - 36.0 g/dL   RDW 08.6 (*) 57.8 - 46.9 %   Platelets 145 (*) 150 - 400 K/uL  CREATININE, SERUM     Status: Abnormal   Collection Time    06/07/13 10:19 AM      Result Value Range   Creatinine, Ser 1.44 (*) 0.50 - 1.35 mg/dL   GFR calc non Af Amer 49 (*) >90 mL/min   GFR calc Af Amer 57 (*) >90 mL/min   Comment: (NOTE)     The eGFR has been calculated using the CKD EPI equation.  This calculation has not been validated in all clinical situations.     eGFR's persistently <90 mL/min signify possible Chronic Kidney     Disease.  TROPONIN I     Status: None   Collection Time    06/07/13 10:19 AM      Result Value Range   Troponin I <0.30  <0.30 ng/mL   Comment:            Due to the release kinetics of cTnI,     a negative result within the first hours     of the onset of symptoms does not rule out     myocardial infarction with certainty.     If myocardial infarction is still suspected,     repeat the test at appropriate intervals.    EKG:   IMAGING: Dg Chest 2 View  06/07/2013   *RADIOLOGY REPORT*  Clinical Data: History of heart failure  CHEST - 2 VIEW  Comparison: Prior chest x-ray 06/03/2012  Findings: Interval development of small bilateral pleural effusions and associated mild bibasilar atelectasis.  Slightly increased pulmonary vascular congestion without overt edema. Progressed cardiomegaly.  Atherosclerotic calcifications noted in the transverse aorta.  No acute osseous abnormality  IMPRESSION:  1.  Trace bilateral pleural effusions with associated bibasilar atelectasis. 2.  Mild pulmonary vascular congestion without overt edema. 3.  Progressed cardiomegaly compared to 06/03/2012.   Original Report  Authenticated By: Malachy Moan, M.D.   Dg Abd 1 View  06/07/2013   *RADIOLOGY REPORT* diarrhea, nausea  Clinical Data: And diarrhea, nausea  ABDOMEN - 1 VIEW  Comparison: Prior abdominal ultrasound 02/29/2012  Findings: Two radiographs used to encompass the entirety of the abdomen.  Abnormal bowel gas pattern with gas in multiple loops of nondilated small bowel.  Gas and stool are noted the descending colon.  No massive free air on this supine series.  Multilevel degenerative changes in the thoracic and lumbar spine.  Chronic- appearing L1 vertebral compression fracture.  No acute osseous abnormality.  IMPRESSION:  Gas noted within multiple loops of nondilated small bowel.  This is a nonspecific appearance and can be seen with gastroenteritis, reactive ileus and early developing obstruction.   Original Report Authenticated By: Malachy Moan, M.D.   Ct Angio Chest Pe W/cm &/or Wo Cm  06/07/2013   CLINICAL DATA:  Paroxysmal atrial fibrillation. Ischemic cardiomyopathy. Chronic right-sided heart failure. Initial evaluation for worsening shortness of breath over the past 3 weeks such that he is unable to perform his activities of daily living. Hyponatremia on current laboratory data.  EXAM: CT ANGIOGRAPHY CHEST WITH CONTRAST  TECHNIQUE: Multidetector CT imaging of the chest was performed using the standard protocol during bolus administration of intravenous contrast. Multiplanar CT image reconstructions including MIPs were obtained to evaluate the vascular anatomy.  CONTRAST:  OMNIPAQUE IOHEXOL 350 MG/ML IV.  COMPARISON:  No prior CT.  Multiple prior chest x-rays  FINDINGS: Contrast opacification is of the pulmonary arteries is excellent. No filling defects within either main pulmonary artery or their branches in either lung to suggest pulmonary embolism. Cardiac silhouette moderately enlarged with biventricular enlargement. Small pericardial effusion. Extensive 3 vessel coronary atherosclerosis.  Aortic annular and moderate aortic valvular calcification. Mild atherosclerosis involving the thoracic and upper abdominal aorta and their branches without evidence of aneurysm.  Moderately large right pleural effusion and small left pleural effusion. Passive atelectasis in the right lower lobe. No evidence of interstitial pulmonary edema. No confluent airspace consolidation elsewhere. No pulmonary parenchymal nodules or  masses. Central airways patent with mild bronchial wall thickening.  Multiple normal sized lymph nodes throughout the mediastinum and in both hila. No significant lymphadenopathy. Thyroid gland normal in appearance. Mild bilateral gynecomastia.  Moderate ascites in the visualized upper abdomen. Remaining visualized upper abdomen unremarkable. Bone window images demonstrate degenerative changes and DISH throughout the thoracic spine.  Review of the MIP images confirms the above findings.  IMPRESSION: 1. No evidence of pulmonary embolism. 2. Moderately large right pleural effusion with associated passive atelectasis in the right lower lobe. Small left pleural effusion. 3. No evidence of interstitial pulmonary edema. 4. Cardiomegaly with biventricular enlargement. Small pericardial effusion. 5. Extensive 3 vessel coronary atherosclerosis. Moderate aortic valvular calcification. 6. Moderate ascites in the visualized upper abdomen. 7. Mild bilateral gynecomastia.   Electronically Signed   By: Hulan Saas   On: 06/07/2013 11:50    IMPRESSION: Principal Problem:   Acute respiratory failure with hypoxia Active Problems:   Acute on chronic systolic CHF (congestive heart failure)   Paroxysmal atrial fibrillation   Diabetes mellitus type 2 with complications   Cardiomyopathy, ischemic, EF 25% by echo 11/28/11.(declined ICD)   Chronic right-sided HF (heart failure) - with ascites   CAD - cath '06- no details. Myoview low risk March 2013   Diarrhea   High anion gap metabolic acidosis   CKD  (chronic kidney disease) stage 3, GFR 30-59 ml/min   Hypotension on admission 9/27   Diverticular bleed 7/13- (not on chronic anticoagulation now)   Hyponatremia  on admission   Hypertension   RECOMMENDATION: Dr Allyson Sabal to see. Pt back from CTA to r/o PE (negative for PE).  No obvious CHF on exam. He appears to have been in NSR on his last office visit and it may be that he just doesn't tolerate AF with his cardiomyopathy. Will need to watch SCr closely, he has seen Dr Lowell Guitar in the past. Baseline SCr 1.5-20. I would hold further diuretic doses till his BMP comes back in am. He was given IV Diltiazem in the ER and became hypotensive. He is on low dose Coreg at home, will resume this. Consider low dose short acting Diltiazem for HR control.  Time Spent Directly with Patient: 60 minutes  Steve Mccarty [phone] beeper 06/07/2013, 12:21 PM   Agree with note written by Corine Shelter PAC  Pt of Dr. Blanchie Dessert with NISCM and H/O PAF. Seen by Dr. Rennis Golden in July and was stable. He appeared to be in NSR then. C/O diarrhea and progressive weakness and DOE. Denies CP. In ER he was in AFIB with RVR (VR now about 110). Exam benign. He is in NAD and appears comfortable. Agree that his gradual decline is probably multifactorial but AFIB is a big component given his decreased LVEF. Would avoid dig given his Renal dysfunction. He was on low dose Coreg as an OP (will resume) and add low dose short acting dilt. Hold gout meds (colchicine). Follow renal function after CT angio (No pulm emb)/ Will follow with you. Not a candidate for chronic oral anticoag given GIB in past.  Runell Gess 06/07/2013 2:35 PM

## 2013-06-07 NOTE — ED Notes (Signed)
One bag of pt belonging transferred on stretcher with pt upstairs with clothing and glasses.

## 2013-06-07 NOTE — ED Provider Notes (Addendum)
CSN: [id number]     Arrival date & time 06/07/13  4098 History   First MD Initiated Contact with Patient 06/07/13 [phone]     Chief Complaint  Patient presents with  . Diarrhea  . Nausea   (Consider location/radiation/quality/duration/timing/severity/associated sxs/prior Treatment) HPI 67 year old male presents to emergency department from home with complaint of ongoing progressive diarrhea, generalized weakness, nausea.  Patient reports diarrhea started about 9 weeks ago after starting a new gout medication.  After taking it for about 2-3 weeks, he stopped.  He has had persistent diarrhea since that time.  Patient contacted his gastroenterologist, Dr. Audley Hose, who recommended Imodium, Kaopectate, and Citrucel.  He reports no improvement with these interventions.  Patient reports he is unable to take his medications or eat due to the extreme nausea.  He has not had any vomiting.  Patient was eating 3 doughnuts a day until recently, as that was the only food that was appetizing.  Tonight the diarrhea, increased until it was every hour.  Patient reports diarrhea.  Is mucousy.  Occasionally, he will have streaks of blood.  He does have past history of GI bleeding, and for this.  He is no longer on anticoagulation for his A. fib.  He has had intermittent abdominal pains, but none now.  No fevers no chills.  No foreign travel, no unusual foods.  No prior history of same. Past Medical History  Diagnosis Date  . Atrial fibrillation   . Hypertension   . Coronary artery disease   . CHF (congestive heart failure)     perpheral edema  . Chronic renal disease, stage III   . Joint pain   . Aortic valve insufficiency, rheumatic   . Diabetes mellitus     Was for 10 years, stopped Metformin 2010  . GERD (gastroesophageal reflux disease)   . Blood in stool   . Cardiomyopathy, ischemic, EF 25% by echo 11/28/11. 06/03/2012  . Chronic right-sided HF (heart failure)     ascites  . History of nuclear stress test  11/28/2011    mild perfusion defect in mid inferior, basal inferolateral & mid inferolateral region; low-risk  . History of Doppler ultrasound 11/28/2011    L ant tibial - occlusive disease with re-constitution at dorsalis pedis   Past Surgical History  Procedure Laterality Date  . Tonsillectomy    . Cardiac catheterization  2006    Dr. Aleen Campi  . Esophagogastroduodenoscopy  11/03/2011    Procedure: ESOPHAGOGASTRODUODENOSCOPY (EGD);  Surgeon: Theda Belfast, MD;  Location: [facility];  Service: Endoscopy;  Laterality: N/A;  . Colonoscopy  11/03/2011    Procedure: COLONOSCOPY;  Surgeon: Theda Belfast, MD;  Location: [facility];  Service: Endoscopy;  Laterality: N/A;  . Esophagogastroduodenoscopy  03/22/2012    Procedure: ESOPHAGOGASTRODUODENOSCOPY (EGD);  Surgeon: Theda Belfast, MD;  Location: [facility];  Service: Endoscopy;  Laterality: N/A;  . Esophagogastroduodenoscopy  04/04/2012    Procedure: ESOPHAGOGASTRODUODENOSCOPY (EGD);  Surgeon: Theda Belfast, MD;  Location: [facility];  Service: Endoscopy;  Laterality: N/A;   No family history on file. History  Substance Use Topics  . Smoking status: Never Smoker   . Smokeless tobacco: Never Used  . Alcohol Use: Yes     Comment: beer, rarely    Review of Systems  See History of Present Illness; otherwise all other systems are reviewed and negative  Allergies  Review of patient's allergies indicates no known allergies.  Home Medications   Current Outpatient Rx  Name  Route  Sig  Dispense  Refill  . aspirin EC 81 MG tablet   Oral   Take 81 mg by mouth every morning.          . carvedilol (COREG) 3.125 MG tablet   Oral   Take 1 tablet (3.125 mg total) by mouth 2 (two) times daily with a meal.   60 tablet   1   . cholecalciferol (VITAMIN D) 1000 UNITS tablet   Oral   Take 1,000 Units by mouth every morning.          . docusate sodium (COLACE) 100 MG capsule   Oral   Take 100 mg by mouth 2 (two) times daily as  needed for constipation. constipation         . Magnesium Oxide (PHILLIPS) 500 MG (LAX) TABS   Oral   Take 1 tablet by mouth daily as needed (for upset stomach). Upset stomach         . Multiple Vitamin (MULTIVITAMIN WITH MINERALS) TABS   Oral   Take 1 tablet by mouth every morning.          . pantoprazole (PROTONIX) 40 MG tablet   Oral   Take 1 tablet (40 mg total) by mouth 2 (two) times daily before a meal.   60 tablet   3   . potassium chloride SA (K-DUR,KLOR-CON) 20 MEQ tablet   Oral   Take 20 mEq by mouth every morning.         . pravastatin (PRAVACHOL) 40 MG tablet   Oral   Take 40 mg by mouth every evening.          Marland Kitchen spironolactone (ALDACTONE) 50 MG tablet   Oral   Take 25 mg by mouth every morning.          . torsemide (DEMADEX) 20 MG tablet   Oral   Take 40 mg by mouth every morning. Take 2(20mg )tablets  By mouth a day         . traMADol (ULTRAM) 50 MG tablet   Oral   Take 50 mg by mouth 2 (two) times daily as needed for pain. pain          BP 115/72  Pulse 139  Temp(Src) 97.5 F (36.4 C) (Oral)  Resp 20  Ht 5\' 8"  (1.727 m)  Wt 180 lb (81.647 kg)  BMI 27.38 kg/m2  SpO2 100% Physical Exam  Nursing note and vitals reviewed. Constitutional: He is oriented to person, place, and time. He appears well-developed and well-nourished.  Pale, ill-appearing  HENT:  Head: Normocephalic and atraumatic.  Right Ear: External ear normal.  Left Ear: External ear normal.  Nose: Nose normal.  Dry mucous membranes  Eyes: Conjunctivae and EOM are normal. Pupils are equal, round, and reactive to light.  Neck: Normal range of motion. Neck supple. No JVD present. No tracheal deviation present. No thyromegaly present.  Cardiovascular: Intact distal pulses.  Exam reveals no gallop and no friction rub.   Murmur heard. Tachycardia with irregularity  Pulmonary/Chest: Effort normal and breath sounds normal. No stridor. No respiratory distress. He has no  wheezes. He has no rales. He exhibits no tenderness.  Abdominal: Soft. He exhibits no distension and no mass. There is no tenderness. There is no rebound and no guarding.  Hyperactive bowel sounds  Musculoskeletal: Normal range of motion. He exhibits no edema and no tenderness.  Lymphadenopathy:    He has no cervical adenopathy.  Neurological: He is alert and oriented to person,  place, and time. He exhibits normal muscle tone. Coordination normal.  Skin: Skin is warm and dry. No rash noted. No erythema. No pallor.  Psychiatric: He has a normal mood and affect. His behavior is normal. Judgment and thought content normal.    ED Course  Procedures (including critical care time) Labs Review Labs Reviewed  COMPREHENSIVE METABOLIC PANEL - Abnormal; Notable for the following:    Sodium 124 (*)    Chloride 91 (*)    CO2 12 (*)    Glucose, Bld 161 (*)    Creatinine, Ser 1.51 (*)    Total Bilirubin 1.4 (*)    GFR calc non Af Amer 46 (*)    GFR calc Af Amer 53 (*)    All other components within normal limits  CBC WITH DIFFERENTIAL - Abnormal; Notable for the following:    Hemoglobin 11.9 (*)    HCT 37.3 (*)    MCV 72.4 (*)    MCH 23.1 (*)    RDW 17.4 (*)    Monocytes Relative 15 (*)    All other components within normal limits  TROPONIN I  URINALYSIS, ROUTINE W REFLEX MICROSCOPIC   Imaging Review No results found.  Date: 06/07/2013  Rate: 152  Rhythm: atrial fibrillation  QRS Axis: left  Intervals: afib  ST/T Wave abnormalities: ST depressions diffusely  Conduction Disutrbances:afib  Narrative Interpretation:   Old EKG Reviewed: changes noted   MDM   1. Diarrhea   2. Metabolic acidosis   3. Hyponatremia   4. Hypochloremia   5. Atrial fibrillation with RVR    67 year old male with worsening chronic diarrhea.  Patient also with anorexia, and nausea.  He appears clinically very dehydrated.  We'll get labs, may need CT scan.  6:34 AM Patient with hyponatremia,  hypochloremia, metabolic acidosis with anion gap of 21.  His heart rate has improved only slightly with saline bolus.  We'll give a second bolus and also treated with Cardizem to control his A. fib rate.  Will discuss with hospitalist for admission for dehydration and metabolic derangements.    Olivia Mackie, MD 06/07/13 [phone]  7:23 AM Patient with transitory drop in blood pressure after administration of Cardizem.  Drip was canceled.  His blood pressure has responded to fluids.  His heart rate has decreased from 130-100.  Will discuss with hospitalist for admission.  Olivia Mackie, MD 06/07/13 [phone]

## 2013-06-07 NOTE — H&P (Signed)
[facility]          History and Physical    PCP:   Thayer Headings, MD   Chief Complaint:  Diarrhea, shortness of breath  HPI: Patient is a very pleasant 67 year old white man with multiple medical comorbidities including ischemic cardiomyopathy with a known ejection fraction of 25%, atrial fibrillation who has not been maintained on anticoagulation given GI bleed, hypertension, chronic kidney disease stage III. He states that about 9 weeks ago he started a new medication for his gout (colchicine) which caused him to have diarrhea. He discontinued this medication however has continued to have diarrhea about once every hour to 2 hours. He went to see his gastroenterologist, Dr. Elnoria Howard, who recommended he use Imodium and Kaopectate without relief. He has become more sedentary at home and has been using diapers because he is afraid of leaving his house and having diarrhea. He has hardly been able to tolerate any oral intake as he thinks it makes his diarrhea worse, in fact he states that over the past 2 weeks he has only had a few doughnuts as it seems to be the only thing that he has an appetite for. He comes in the hospital today for evaluation of his diarrhea. He also states that for about 3 weeks he has been having increasing shortness of breath, to the point where it has become more difficult for him to perform his activities of daily living. In the emergency department he is found to be hyponatremic, he also has a high anion gap acidosis, possibly mild acute CHF. He was also initially noted to be in atrial fibrillation with rapid ventricular response with a rate of about 150s. He was given 10 mg of Cardizem as a bolus with the intention of converting him into a drip, however he became hypotensive and Cardizem had to be discontinued. His rate has improved into the 1 teens at the moment. We have been asked to admit him for further evaluation and management.  Allergies:  No Known  Allergies    Past Medical History  Diagnosis Date  . Atrial fibrillation   . Hypertension   . Coronary artery disease   . CHF (congestive heart failure)     perpheral edema  . Chronic renal disease, stage III   . Joint pain   . Aortic valve insufficiency, rheumatic   . Diabetes mellitus     Was for 10 years, stopped Metformin 2010  . GERD (gastroesophageal reflux disease)   . Blood in stool   . Cardiomyopathy, ischemic, EF 25% by echo 11/28/11. 06/03/2012  . Chronic right-sided HF (heart failure)     ascites  . History of nuclear stress test 11/28/2011    mild perfusion defect in mid inferior, basal inferolateral & mid inferolateral region; low-risk  . History of Doppler ultrasound 11/28/2011    L ant tibial - occlusive disease with re-constitution at dorsalis pedis    Past Surgical History  Procedure Laterality Date  . Tonsillectomy    . Cardiac catheterization  2006    Dr. Aleen Campi  . Esophagogastroduodenoscopy  11/03/2011    Procedure: ESOPHAGOGASTRODUODENOSCOPY (EGD);  Surgeon: Theda Belfast, MD;  Location: [facility];  Service: Endoscopy;  Laterality: N/A;  . Colonoscopy  11/03/2011    Procedure: COLONOSCOPY;  Surgeon: Theda Belfast, MD;  Location: [facility];  Service: Endoscopy;  Laterality: N/A;  . Esophagogastroduodenoscopy  03/22/2012    Procedure: ESOPHAGOGASTRODUODENOSCOPY (EGD);  Surgeon: Theda Belfast, MD;  Location: [facility];  Service:  Endoscopy;  Laterality: N/A;  . Esophagogastroduodenoscopy  04/04/2012    Procedure: ESOPHAGOGASTRODUODENOSCOPY (EGD);  Surgeon: Theda Belfast, MD;  Location: [facility];  Service: Endoscopy;  Laterality: N/A;    Prior to Admission medications   Medication Sig Start Date End Date Taking? Authorizing Provider  aspirin EC 81 MG tablet Take 81 mg by mouth every morning.    Yes Historical Provider, MD  carvedilol (COREG) 3.125 MG tablet Take 1 tablet (3.125 mg total) by mouth 2 (two) times daily with a meal. 07/05/12  Yes  Estela Isaiah Blakes, MD  cholecalciferol (VITAMIN D) 1000 UNITS tablet Take 1,000 Units by mouth every morning.    Yes Historical Provider, MD  docusate sodium (COLACE) 100 MG capsule Take 100 mg by mouth 2 (two) times daily as needed for constipation. constipation   Yes Historical Provider, MD  Magnesium Oxide (PHILLIPS) 500 MG (LAX) TABS Take 1 tablet by mouth daily as needed (for upset stomach). Upset stomach   Yes Historical Provider, MD  Multiple Vitamin (MULTIVITAMIN WITH MINERALS) TABS Take 1 tablet by mouth every morning.    Yes Historical Provider, MD  pantoprazole (PROTONIX) 40 MG tablet Take 1 tablet (40 mg total) by mouth 2 (two) times daily before a meal. 06/09/12  Yes Ripudeep K Rai, MD  potassium chloride SA (K-DUR,KLOR-CON) 20 MEQ tablet Take 20 mEq by mouth every morning.   Yes Historical Provider, MD  pravastatin (PRAVACHOL) 40 MG tablet Take 40 mg by mouth every evening.    Yes Historical Provider, MD  spironolactone (ALDACTONE) 50 MG tablet Take 25 mg by mouth every morning.    Yes Historical Provider, MD  torsemide (DEMADEX) 20 MG tablet Take 40 mg by mouth every morning. Take 2(20mg )tablets  By mouth a day 03/20/13  Yes Chrystie Nose, MD  traMADol (ULTRAM) 50 MG tablet Take 50 mg by mouth 2 (two) times daily as needed for pain. pain   Yes Historical Provider, MD    Social History:  reports that he has never smoked. He has never used smokeless tobacco. He reports that  drinks alcohol. He reports that he does not use illicit drugs.  No family history on file.  Review of Systems:  Constitutional: Denies fever, chills, diaphoresis. HEENT: Denies photophobia, eye pain, redness, hearing loss, ear pain, congestion, sore throat, rhinorrhea, sneezing, mouth sores, trouble swallowing, neck pain, neck stiffness and tinnitus.   Respiratory: Denies cough, chest tightness,  and wheezing.   Cardiovascular: Denies chest pain, palpitations and leg swelling.  Gastrointestinal:  Denies nausea, vomiting, abdominal pain, constipation, blood in stool and abdominal distention.  Genitourinary: Denies dysuria, urgency, frequency, hematuria, flank pain and difficulty urinating.  Endocrine: Denies: hot or cold intolerance, sweats, changes in hair or nails, polyuria, polydipsia. Musculoskeletal: Denies myalgias, back pain, joint swelling, arthralgias and gait problem.  Skin: Denies pallor, rash and wound.  Neurological: Denies dizziness, seizures, syncope, weakness, light-headedness, numbness and headaches.  Hematological: Denies adenopathy. Easy bruising, personal or family bleeding history  Psychiatric/Behavioral: Denies suicidal ideation, mood changes, confusion, nervousness, sleep disturbance and agitation   Physical Exam: Blood pressure 119/77, pulse 128, temperature 97.5 F (36.4 C), temperature source Axillary, resp. rate 28, height 5\' 8"  (1.727 m), weight 82.1 kg (181 lb), SpO2 89.00%. General: Alert, awake, oriented x3, appears a bit cyanotic. HEENT: Normocephalic, atraumatic, pupils equal round and reactive to light, extra movements intact, wears corrective lenses, moist mucous membranes. Neck: Supple, no JVD, no lymphadenopathy, no bruits, no goiter. Cardiovascular: Tachycardic, irregular, no  rubs or gallops. Lungs: Mild decreased bibasilar breath sounds, no crackles or wheezing. Abdomen: Soft, nontender, slightly distended, positive bowel sounds. Extremities: Trace bilateral pitting edema, positive pedal pulses. Neurologic: Grossly intact and nonfocal.  Labs on Admission:  Results for orders placed during the hospital encounter of 06/07/13 (from the past 48 hour(s))  COMPREHENSIVE METABOLIC PANEL     Status: Abnormal   Collection Time    06/07/13  5:15 AM      Result Value Range   Sodium 124 (*) 135 - 145 mEq/L   Comment: REPEATED TO VERIFY   Potassium 3.7  3.5 - 5.1 mEq/L   Chloride 91 (*) 96 - 112 mEq/L   Comment: REPEATED TO VERIFY   CO2 12 (*) 19 - 32  mEq/L   Comment: REPEATED TO VERIFY   Glucose, Bld 161 (*) 70 - 99 mg/dL   BUN 23  6 - 23 mg/dL   Creatinine, Ser 1.61 (*) 0.50 - 1.35 mg/dL   Calcium 8.9  8.4 - 09.6 mg/dL   Total Protein 7.3  6.0 - 8.3 g/dL   Albumin 3.8  3.5 - 5.2 g/dL   AST 37  0 - 37 U/L   ALT 22  0 - 53 U/L   Alkaline Phosphatase 113  39 - 117 U/L   Total Bilirubin 1.4 (*) 0.3 - 1.2 mg/dL   GFR calc non Af Amer 46 (*) >90 mL/min   GFR calc Af Amer 53 (*) >90 mL/min   Comment: (NOTE)     The eGFR has been calculated using the CKD EPI equation.     This calculation has not been validated in all clinical situations.     eGFR's persistently <90 mL/min signify possible Chronic Kidney     Disease.  CBC WITH DIFFERENTIAL     Status: Abnormal   Collection Time    06/07/13  5:15 AM      Result Value Range   WBC 5.3  4.0 - 10.5 K/uL   RBC 5.15  4.22 - 5.81 MIL/uL   Hemoglobin 11.9 (*) 13.0 - 17.0 g/dL   HCT 04.5 (*) 40.9 - 81.1 %   MCV 72.4 (*) 78.0 - 100.0 fL   MCH 23.1 (*) 26.0 - 34.0 pg   MCHC 31.9  30.0 - 36.0 g/dL   RDW 91.4 (*) 78.2 - 95.6 %   Platelets 164  150 - 400 K/uL   Neutrophils Relative % 60  43 - 77 %   Neutro Abs 3.2  1.7 - 7.7 K/uL   Lymphocytes Relative 25  12 - 46 %   Lymphs Abs 1.3  0.7 - 4.0 K/uL   Monocytes Relative 15 (*) 3 - 12 %   Monocytes Absolute 0.8  0.1 - 1.0 K/uL   Eosinophils Relative 0  0 - 5 %   Eosinophils Absolute 0.0  0.0 - 0.7 K/uL   Basophils Relative 1  0 - 1 %   Basophils Absolute 0.0  0.0 - 0.1 K/uL  TROPONIN I     Status: None   Collection Time    06/07/13  5:15 AM      Result Value Range   Troponin I <0.30  <0.30 ng/mL   Comment:            Due to the release kinetics of cTnI,     a negative result within the first hours     of the onset of symptoms does not rule out     myocardial  infarction with certainty.     If myocardial infarction is still suspected,     repeat the test at appropriate intervals.    Radiological Exams on Admission: Dg Chest 2  View  06/07/2013   *RADIOLOGY REPORT*  Clinical Data: History of heart failure  CHEST - 2 VIEW  Comparison: Prior chest x-ray 06/03/2012  Findings: Interval development of small bilateral pleural effusions and associated mild bibasilar atelectasis.  Slightly increased pulmonary vascular congestion without overt edema. Progressed cardiomegaly.  Atherosclerotic calcifications noted in the transverse aorta.  No acute osseous abnormality  IMPRESSION:  1.  Trace bilateral pleural effusions with associated bibasilar atelectasis. 2.  Mild pulmonary vascular congestion without overt edema. 3.  Progressed cardiomegaly compared to 06/03/2012.   Original Report Authenticated By: Malachy Moan, M.D.   Dg Abd 1 View  06/07/2013   *RADIOLOGY REPORT* diarrhea, nausea  Clinical Data: And diarrhea, nausea  ABDOMEN - 1 VIEW  Comparison: Prior abdominal ultrasound 02/29/2012  Findings: Two radiographs used to encompass the entirety of the abdomen.  Abnormal bowel gas pattern with gas in multiple loops of nondilated small bowel.  Gas and stool are noted the descending colon.  No massive free air on this supine series.  Multilevel degenerative changes in the thoracic and lumbar spine.  Chronic- appearing L1 vertebral compression fracture.  No acute osseous abnormality.  IMPRESSION:  Gas noted within multiple loops of nondilated small bowel.  This is a nonspecific appearance and can be seen with gastroenteritis, reactive ileus and early developing obstruction.   Original Report Authenticated By: Malachy Moan, M.D.    Assessment/Plan Principal Problem:   Diarrhea Active Problems:   SOB (shortness of breath)   Paroxysmal atrial fibrillation   Hyponatremia  on admission   Cardiomyopathy, ischemic, EF 25% by echo 11/28/11.   High anion gap metabolic acidosis   Acute on chronic systolic CHF (congestive heart failure)   Acute respiratory failure with hypoxia   CKD (chronic kidney disease) stage 3, GFR 30-59  ml/min   Diarrhea -This is the patient's main complaint. -Etiology remains unclear to me at this point. -We'll check a C. difficile PCR. -If continues to be a problem may require GI input.  Acute hypoxemic respiratory failure -Chest x-ray with only mild pulmonary vascular congestion and no overt pulmonary edema. -No airspace disease or infiltrates consistent with an acute pneumonic process. -On the floor he has been saturating in the 80s occasionally dipping into the high 70s. -I have a hard time explaining his hypoxemia with CHF given his lack of overt pulmonary edema. -He definitely has been more sedentary recently with his diarrhea and as such is at a higher risk for PE. -He is at least moderate probability for PE by Wells criteria, we'll order CT angiogram of the chest to further evaluate. -Will order an arterial blood gas. -His tachypnea could be explained as compensation for his metabolic acidosis, however I do not see how this could explain hypoxemia.  Atrial fibrillation with rapid ventricular response -Experienced hypotension with a systolic blood pressure in the 70s after a Cardizem bolus. -His rates have improved into the 1 teens. -Will hold off on further Cardizem dosing. -Have discussed case with cardiology, Dr. Allyson Sabal, who has graciously agreed to see patient in consultation.  Acute on chronic systolic CHF/ischemic cardiomyopathy -With known ejection fraction of 25%. -Will hold off of beta blocker and spironolactone given hypotension. Can consider restarting these medications if blood pressure continues to improve. -Does not appear markedly volume overloaded on  exam, as such we'll not order IV diuretics and simply place him on his home medication. -Cardiology has been consulted.  High anion gap metabolic acidosis -I cannot explain the solely by his diarrhea as diarrhea typically causes a non-gap acidosis. -He has had decreased by mouth intake over the past 2 weeks, so  this could potentially have a component of starvation ketoacidosis. -Continue to follow. -And ABG has been ordered to further evaluate.  Chronic kidney disease stage III -Creatinine is at baseline of 1.5-1.7.  DVT prophylaxis -Lovenox.  CODE STATUS -Full code as discussed with patient.  Disposition -Keep in telemetry for now, but low threshold for transferring to step down unit if he remains hypoxemic.  Time Spent on Admission: 85 minutes  [name],ESTELA [facility] Pager: [phone] 06/07/2013, 9:49 AM

## 2013-06-07 NOTE — ED Notes (Signed)
md made aware of b/p cardizem drip stopped.

## 2013-06-07 NOTE — ED Notes (Signed)
He is awake, alert and oriented x 4 and in no distress.  He tells me he is pain-free.

## 2013-06-07 NOTE — ED Notes (Signed)
Pt c/o mucous diarrhea x 6 weeks, hourly since yesterday,  +nausea

## 2013-06-08 LAB — CBC
HCT: 37.3 % — ABNORMAL LOW (ref 39.0–52.0)
Hemoglobin: 11.8 g/dL — ABNORMAL LOW (ref 13.0–17.0)
MCH: 22.8 pg — ABNORMAL LOW (ref 26.0–34.0)
MCHC: 31.6 g/dL (ref 30.0–36.0)
MCV: 72.1 fL — ABNORMAL LOW (ref 78.0–100.0)
RBC: 5.17 MIL/uL (ref 4.22–5.81)
RDW: 17.3 % — ABNORMAL HIGH (ref 11.5–15.5)

## 2013-06-08 LAB — BASIC METABOLIC PANEL
BUN: 27 mg/dL — ABNORMAL HIGH (ref 6–23)
CO2: 15 mEq/L — ABNORMAL LOW (ref 19–32)
Chloride: 92 mEq/L — ABNORMAL LOW (ref 96–112)
Creatinine, Ser: 1.76 mg/dL — ABNORMAL HIGH (ref 0.50–1.35)
GFR calc non Af Amer: 38 mL/min — ABNORMAL LOW (ref >90)
Glucose, Bld: 115 mg/dL — ABNORMAL HIGH (ref 70–99)
Potassium: 3.6 mEq/L (ref 3.5–5.1)

## 2013-06-08 LAB — LACTIC ACID, PLASMA: Lactic Acid, Venous: 3.8 mmol/L — ABNORMAL HIGH (ref 0.5–2.2)

## 2013-06-08 MED ORDER — LOPERAMIDE HCL 2 MG PO CAPS
2.0000 mg | ORAL_CAPSULE | ORAL | Status: DC | PRN
  Administered 2013-06-08 – 2013-06-09 (×5): 2 mg via ORAL
  Filled 2013-06-08 (×5): qty 1

## 2013-06-08 MED ORDER — DIPHENHYDRAMINE-ZINC ACETATE 2-0.1 % EX CREA
TOPICAL_CREAM | Freq: Three times a day (TID) | CUTANEOUS | Status: DC | PRN
  Administered 2013-06-08 – 2013-06-09 (×3): via TOPICAL
  Filled 2013-06-08 (×2): qty 28

## 2013-06-08 MED ORDER — ENSURE COMPLETE PO LIQD
237.0000 mL | Freq: Two times a day (BID) | ORAL | Status: DC
  Administered 2013-06-08 – 2013-06-11 (×3): 237 mL via ORAL

## 2013-06-08 NOTE — Progress Notes (Signed)
INITIAL NUTRITION ASSESSMENT  DOCUMENTATION CODES Per approved criteria  -Not Applicable   INTERVENTION: Ensure Complete po BID, each supplement provides 350 kcal and 13 grams of protein.  NUTRITION DIAGNOSIS: Inadequate oral intake related to nausea and diarrhea as evidenced by reported intake less than estimated needs.   Goal: Pt to meet >/= 90% of their estimated nutrition needs   Monitor:  Weight, po intake, acceptance of supplements, labs  Reason for Assessment: MST  67 y.o. male  Admitting Dx: Acute respiratory failure with hypoxia  ASSESSMENT: Pt with PMH of ischemic cardiomyopathy, afib, hypertension, CKD stage III. Per MD note, pt reported that about 9 weeks ago he started new medication for gout which caused him to have diarrhea. He has been unable to tolerate oral intake d/t increased frequency of diarrhea. Pt reports that he has only been eating a few doughnuts because it is the only thing he has felt like eating.  Pt reports that his appetite has increased since admission to the hospital and that he ate all of his dinner last night. He had eaten part of his lunch, but reported he didn't eat as much today because he feels very tired. He reports his UBW as 180 lbs which reflects no weight loss. He said that he would like to try Ensure Plus to see if it makes him feel better.   Height: Ht Readings from Last 1 Encounters:  06/07/13 5\' 8"  (1.727 m)    Weight: Wt Readings from Last 1 Encounters:  06/07/13 181 lb (82.1 kg)    Ideal Body Weight: 63.9 kg  % Ideal Body Weight: 128%  Wt Readings from Last 10 Encounters:  06/07/13 181 lb (82.1 kg)  04/10/13 178 lb 8 oz (80.967 kg)  03/26/13 172 lb 8 oz (78.245 kg)  03/20/13 176 lb (79.833 kg)  07/03/12 145 lb (65.772 kg)  06/08/12 150 lb 9.6 oz (68.312 kg)  03/21/12 150 lb 12.7 oz (68.4 kg)  03/21/12 150 lb 12.7 oz (68.4 kg)  11/03/11 180 lb (81.647 kg)  11/03/11 180 lb (81.647 kg)    Usual Body Weight: 180  lbs  % Usual Body Weight: 100%  BMI:  Body mass index is 27.53 kg/(m^2).  Estimated Nutritional Needs: Kcal: 2000-2200 Protein: 90-100 g Fluid: 2.0-2.2 L  Skin: WNL  Diet Order: Cardiac  EDUCATION NEEDS: -No education needs identified at this time   Intake/Output Summary (Last 24 hours) at 06/08/13 1420 Last data filed at 06/07/13 1500  Gross per 24 hour  Intake      0 ml  Output    225 ml  Net   -225 ml    Last BM: 9/28   Labs:   Recent Labs Lab 06/07/13 0515 06/07/13 1019 06/08/13 0556  NA 124*  --  123*  K 3.7  --  3.6  CL 91*  --  92*  CO2 12*  --  15*  BUN 23  --  27*  CREATININE 1.51* 1.44* 1.76*  CALCIUM 8.9  --  9.0  GLUCOSE 161*  --  115*    CBG (last 3)  No results found for this basename: GLUCAP,  in the last 72 hours  Scheduled Meds: . aspirin EC  81 mg Oral q morning - 10a  . carvedilol  3.125 mg Oral BID WC  . cholecalciferol  1,000 Units Oral q morning - 10a  . diltiazem  30 mg Oral Q8H  . enoxaparin (LOVENOX) injection  40 mg Subcutaneous Daily  . multivitamin  with minerals  1 tablet Oral q morning - 10a  . pantoprazole  40 mg Oral BID AC  . simvastatin  5 mg Oral q1800  . sodium chloride  3 mL Intravenous Q12H  . sodium chloride  3 mL Intravenous Q12H    Continuous Infusions:   Past Medical History  Diagnosis Date  . Atrial fibrillation   . Hypertension   . Coronary artery disease   . CHF (congestive heart failure)     perpheral edema  . Chronic renal disease, stage III   . Joint pain   . Aortic valve insufficiency, rheumatic   . Diabetes mellitus     Was for 10 years, stopped Metformin 2010  . GERD (gastroesophageal reflux disease)   . Blood in stool   . Cardiomyopathy, ischemic, EF 25% by echo 11/28/11. 06/03/2012  . Chronic right-sided HF (heart failure)     ascites  . History of nuclear stress test 11/28/2011    mild perfusion defect in mid inferior, basal inferolateral & mid inferolateral region; low-risk  .  History of Doppler ultrasound 11/28/2011    L ant tibial - occlusive disease with re-constitution at dorsalis pedis    Past Surgical History  Procedure Laterality Date  . Tonsillectomy    . Cardiac catheterization  2006    Dr. Aleen Campi  . Esophagogastroduodenoscopy  11/03/2011    Procedure: ESOPHAGOGASTRODUODENOSCOPY (EGD);  Surgeon: Theda Belfast, MD;  Location: [facility];  Service: Endoscopy;  Laterality: N/A;  . Colonoscopy  11/03/2011    Procedure: COLONOSCOPY;  Surgeon: Theda Belfast, MD;  Location: [facility];  Service: Endoscopy;  Laterality: N/A;  . Esophagogastroduodenoscopy  03/22/2012    Procedure: ESOPHAGOGASTRODUODENOSCOPY (EGD);  Surgeon: Theda Belfast, MD;  Location: [facility];  Service: Endoscopy;  Laterality: N/A;  . Esophagogastroduodenoscopy  04/04/2012    Procedure: ESOPHAGOGASTRODUODENOSCOPY (EGD);  Surgeon: Theda Belfast, MD;  Location: [facility];  Service: Endoscopy;  Laterality: N/A;    Ebbie Latus RD, LDN

## 2013-06-08 NOTE — Progress Notes (Signed)
[facility] PROGRESS NOTE  Steve Mccarty WUJ:[id number] DOB: 24-Apr-1946 DOA: 06/07/2013 PCP: Thayer Headings, MD  Assessment/Plan: Acute hypoxemic Respiratory Failure -improved. -CT chest is negative for PE.  A Fib with RVR -rates have improved into the low 100s. -Appreciate cards follow up.  Diarrhea -Continues to be a problem. -C Diff PCR is pending.  High anion-gap acidoses -Check lactic acid. -Suspect related to diarrhea and starvation acidosis. -improved today.  CKD Stage III -At baseline, altho worse than on admission. -Will continue to follow.  Code Status: Full Code Family Communication: Patient only  Disposition Plan: PT/OT evals. Sister has concerns that patient is not taking good care of himself, he is willing to go to SNF if recommended.   Consultants:  Cardiology   Antibiotics:  None   Subjective: Feels better today. Still having frequent diarrhea.  Objective: Filed Vitals:   06/07/13 2040 06/08/13 0545 06/08/13 0558 06/08/13 0800  BP: 95/75 97/54 109/67 99/55  Pulse: 106 108  67  Temp:  97.3 F (36.3 C)    TempSrc:  Oral    Resp: 22 20    Height:      Weight:      SpO2: 99% 99%      Intake/Output Summary (Last 24 hours) at 06/08/13 1029 Last data filed at 06/07/13 1500  Gross per 24 hour  Intake      0 ml  Output    225 ml  Net   -225 ml   Filed Weights   06/07/13 0440 06/07/13 0845  Weight: 81.647 kg (180 lb) 82.1 kg (181 lb)    Exam:   General:  AA Ox3  Cardiovascular: irregular  Respiratory: CTA B  Abdomen: S/NT/ND/+BS  Extremities: trace bilateral edema   Neurologic:  Grossly intact and non-focal.  Data Reviewed: Basic Metabolic Panel:  Recent Labs Lab 06/07/13 0515 06/07/13 1019 06/08/13 0556  NA 124*  --  123*  K 3.7  --  3.6  CL 91*  --  92*  CO2 12*  --  15*  GLUCOSE 161*  --  115*  BUN 23  --  27*  CREATININE 1.51* 1.44* 1.76*  CALCIUM 8.9  --  9.0   Liver Function Tests:  Recent  Labs Lab 06/07/13 0515  AST 37  ALT 22  ALKPHOS 113  BILITOT 1.4*  PROT 7.3  ALBUMIN 3.8   No results found for this basename: LIPASE, AMYLASE,  in the last 168 hours No results found for this basename: AMMONIA,  in the last 168 hours CBC:  Recent Labs Lab 06/07/13 0515 06/07/13 1019 06/08/13 0556  WBC 5.3 4.9 6.0  NEUTROABS 3.2  --   --   HGB 11.9* 11.8* 11.8*  HCT 37.3* 36.8* 37.3*  MCV 72.4* 71.7* 72.1*  PLT 164 145* 166   Cardiac Enzymes:  Recent Labs Lab 06/07/13 0515 06/07/13 1019 06/07/13 1550 06/07/13 2152  TROPONINI <0.30 <0.30 <0.30 <0.30   BNP (last 3 results)  Recent Labs  06/09/12 0625  PROBNP 2046.0*   CBG: No results found for this basename: GLUCAP,  in the last 168 hours  Recent Results (from the past 240 hour(s))  CLOSTRIDIUM DIFFICILE BY PCR     Status: None   Collection Time    06/07/13  9:16 PM      Result Value Range Status   C difficile by pcr NEGATIVE  NEGATIVE Final   Comment: Performed at [facility]     Studies: Dg Chest 2 View  06/07/2013   *  RADIOLOGY REPORT*  Clinical Data: History of heart failure  CHEST - 2 VIEW  Comparison: Prior chest x-ray 06/03/2012  Findings: Interval development of small bilateral pleural effusions and associated mild bibasilar atelectasis.  Slightly increased pulmonary vascular congestion without overt edema. Progressed cardiomegaly.  Atherosclerotic calcifications noted in the transverse aorta.  No acute osseous abnormality  IMPRESSION:  1.  Trace bilateral pleural effusions with associated bibasilar atelectasis. 2.  Mild pulmonary vascular congestion without overt edema. 3.  Progressed cardiomegaly compared to 06/03/2012.   Original Report Authenticated By: Malachy Moan, M.D.   Dg Abd 1 View  06/07/2013   *RADIOLOGY REPORT* diarrhea, nausea  Clinical Data: And diarrhea, nausea  ABDOMEN - 1 VIEW  Comparison: Prior abdominal ultrasound 02/29/2012  Findings: Two radiographs used to encompass  the entirety of the abdomen.  Abnormal bowel gas pattern with gas in multiple loops of nondilated small bowel.  Gas and stool are noted the descending colon.  No massive free air on this supine series.  Multilevel degenerative changes in the thoracic and lumbar spine.  Chronic- appearing L1 vertebral compression fracture.  No acute osseous abnormality.  IMPRESSION:  Gas noted within multiple loops of nondilated small bowel.  This is a nonspecific appearance and can be seen with gastroenteritis, reactive ileus and early developing obstruction.   Original Report Authenticated By: Malachy Moan, M.D.   Ct Angio Chest Pe W/cm &/or Wo Cm  06/07/2013   CLINICAL DATA:  Paroxysmal atrial fibrillation. Ischemic cardiomyopathy. Chronic right-sided heart failure. Initial evaluation for worsening shortness of breath over the past 3 weeks such that he is unable to perform his activities of daily living. Hyponatremia on current laboratory data.  EXAM: CT ANGIOGRAPHY CHEST WITH CONTRAST  TECHNIQUE: Multidetector CT imaging of the chest was performed using the standard protocol during bolus administration of intravenous contrast. Multiplanar CT image reconstructions including MIPs were obtained to evaluate the vascular anatomy.  CONTRAST:  OMNIPAQUE IOHEXOL 350 MG/ML IV.  COMPARISON:  No prior CT.  Multiple prior chest x-rays  FINDINGS: Contrast opacification is of the pulmonary arteries is excellent. No filling defects within either main pulmonary artery or their branches in either lung to suggest pulmonary embolism. Cardiac silhouette moderately enlarged with biventricular enlargement. Small pericardial effusion. Extensive 3 vessel coronary atherosclerosis. Aortic annular and moderate aortic valvular calcification. Mild atherosclerosis involving the thoracic and upper abdominal aorta and their branches without evidence of aneurysm.  Moderately large right pleural effusion and small left pleural effusion. Passive  atelectasis in the right lower lobe. No evidence of interstitial pulmonary edema. No confluent airspace consolidation elsewhere. No pulmonary parenchymal nodules or masses. Central airways patent with mild bronchial wall thickening.  Multiple normal sized lymph nodes throughout the mediastinum and in both hila. No significant lymphadenopathy. Thyroid gland normal in appearance. Mild bilateral gynecomastia.  Moderate ascites in the visualized upper abdomen. Remaining visualized upper abdomen unremarkable. Bone window images demonstrate degenerative changes and DISH throughout the thoracic spine.  Review of the MIP images confirms the above findings.  IMPRESSION: 1. No evidence of pulmonary embolism. 2. Moderately large right pleural effusion with associated passive atelectasis in the right lower lobe. Small left pleural effusion. 3. No evidence of interstitial pulmonary edema. 4. Cardiomegaly with biventricular enlargement. Small pericardial effusion. 5. Extensive 3 vessel coronary atherosclerosis. Moderate aortic valvular calcification. 6. Moderate ascites in the visualized upper abdomen. 7. Mild bilateral gynecomastia.   Electronically Signed   By: Hulan Saas   On: 06/07/2013 11:50  Scheduled Meds: . aspirin EC  81 mg Oral q morning - 10a  . carvedilol  3.125 mg Oral BID WC  . cholecalciferol  1,000 Units Oral q morning - 10a  . diltiazem  30 mg Oral Q8H  . enoxaparin (LOVENOX) injection  40 mg Subcutaneous Daily  . multivitamin with minerals  1 tablet Oral q morning - 10a  . pantoprazole  40 mg Oral BID AC  . simvastatin  5 mg Oral q1800  . sodium chloride  3 mL Intravenous Q12H  . 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 Myoview low risk March 2013   Diarrhea   High anion gap metabolic acidosis   Acute on chronic systolic CHF (congestive heart failure)   CKD (chronic kidney disease) stage 3, GFR 30-59 ml/min   Hypotension on admission 9/27    Time spent: 35 minutes    [name],ESTELA  [facility] Pager [phone]  If 7PM-7AM, please contact night-coverage at [email or web address], password [facility] 06/08/2013, 10:29 AM  LOS: 1 day

## 2013-06-09 LAB — CBC
HCT: 35.2 % — ABNORMAL LOW (ref 39.0–52.0)
Hemoglobin: 11.2 g/dL — ABNORMAL LOW (ref 13.0–17.0)
MCH: 22.8 pg — ABNORMAL LOW (ref 26.0–34.0)
Platelets: 174 10*3/uL (ref 150–400)
RBC: 4.92 MIL/uL (ref 4.22–5.81)
RDW: 17.4 % — ABNORMAL HIGH (ref 11.5–15.5)
WBC: 6.1 10*3/uL (ref 4.0–10.5)

## 2013-06-09 LAB — BASIC METABOLIC PANEL
Calcium: 8.8 mg/dL (ref 8.4–10.5)
Chloride: 92 mEq/L — ABNORMAL LOW (ref 96–112)
Creatinine, Ser: 1.73 mg/dL — ABNORMAL HIGH (ref 0.50–1.35)
GFR calc Af Amer: 45 mL/min — ABNORMAL LOW (ref >90)
GFR calc non Af Amer: 39 mL/min — ABNORMAL LOW (ref >90)
Sodium: 122 mEq/L — ABNORMAL LOW (ref 135–145)

## 2013-06-09 MED ORDER — SODIUM CHLORIDE 0.9 % IV SOLN
INTRAVENOUS | Status: DC
  Administered 2013-06-10: 500 mL via INTRAVENOUS

## 2013-06-09 MED ORDER — METOPROLOL TARTRATE 12.5 MG HALF TABLET
12.5000 mg | ORAL_TABLET | Freq: Three times a day (TID) | ORAL | Status: DC
  Administered 2013-06-09 – 2013-06-10 (×3): 12.5 mg via ORAL
  Filled 2013-06-09 (×8): qty 1

## 2013-06-09 MED ORDER — POTASSIUM CHLORIDE CRYS ER 20 MEQ PO TBCR
30.0000 meq | EXTENDED_RELEASE_TABLET | Freq: Once | ORAL | Status: AC
  Administered 2013-06-09: 18:00:00 30 meq via ORAL
  Filled 2013-06-09: qty 1

## 2013-06-09 NOTE — Progress Notes (Signed)
Pt seen by Mr. Diona Fanti -- I have discussed the findings & recommendations. I agree with converting to Metoprolol from Carvedilol (less BP effect & better rate control.  Marykay Lex, MD .

## 2013-06-09 NOTE — Consult Note (Signed)
Reason for Consult: Diarrhea Referring Physician: [facility]  Steve Mccarty HPI: This is a 67 year old male who is well-known to me with complaints of diarrhea.  I recently evaluated the patient for his diarrhea in the office.  He is a very poor historian and it is confusing to get an accurate history from him.  Initially he was started on colchicine and this is when his diarrhea started.  I asked him to stop the medication, but his diarrhea persisted.  With further questioning he reports having small volume bowel movements and he states that it is every hour.  At the time of his admission his sodium dropped down to 124.  His C. Diff is negative.  Past Medical History  Diagnosis Date  . Atrial fibrillation   . Hypertension   . Coronary artery disease   . CHF (congestive heart failure)     perpheral edema  . Chronic renal disease, stage III   . Joint pain   . Aortic valve insufficiency, rheumatic   . Diabetes mellitus     Was for 10 years, stopped Metformin 2010  . GERD (gastroesophageal reflux disease)   . Blood in stool   . Cardiomyopathy, ischemic, EF 25% by echo 11/28/11. 06/03/2012  . Chronic right-sided HF (heart failure)     ascites  . History of nuclear stress test 11/28/2011    mild perfusion defect in mid inferior, basal inferolateral & mid inferolateral region; low-risk  . History of Doppler ultrasound 11/28/2011    L ant tibial - occlusive disease with re-constitution at dorsalis pedis    Past Surgical History  Procedure Laterality Date  . Tonsillectomy    . Cardiac catheterization  2006    Dr. Aleen Mccarty  . Esophagogastroduodenoscopy  11/03/2011    Procedure: ESOPHAGOGASTRODUODENOSCOPY (EGD);  Surgeon: Steve Belfast, MD;  Location: [facility];  Service: Endoscopy;  Laterality: N/A;  . Colonoscopy  11/03/2011    Procedure: COLONOSCOPY;  Surgeon: Steve Belfast, MD;  Location: [facility];  Service: Endoscopy;  Laterality: N/A;  . Esophagogastroduodenoscopy   03/22/2012    Procedure: ESOPHAGOGASTRODUODENOSCOPY (EGD);  Surgeon: Steve Belfast, MD;  Location: [facility];  Service: Endoscopy;  Laterality: N/A;  . Esophagogastroduodenoscopy  04/04/2012    Procedure: ESOPHAGOGASTRODUODENOSCOPY (EGD);  Surgeon: Steve Belfast, MD;  Location: [facility];  Service: Endoscopy;  Laterality: N/A;    Family History  Problem Relation Age of Onset  . Congestive Heart Failure Mother   . Cancer Sister     Social History:  reports that he has never smoked. He has never used smokeless tobacco. He reports that  drinks alcohol. He reports that he does not use illicit drugs.  Allergies: No Known Allergies  Medications:  Scheduled: . aspirin EC  81 mg Oral q morning - 10a  . cholecalciferol  1,000 Units Oral q morning - 10a  . diltiazem  30 mg Oral Q8H  . enoxaparin (LOVENOX) injection  40 mg Subcutaneous Daily  . feeding supplement  237 mL Oral BID BM  . metoprolol tartrate  12.5 mg Oral Q8H  . multivitamin with minerals  1 tablet Oral q morning - 10a  . pantoprazole  40 mg Oral BID AC  . simvastatin  5 mg Oral q1800  . sodium chloride  3 mL Intravenous Q12H  . sodium chloride  3 mL Intravenous Q12H   Continuous:   Results for orders placed during the hospital encounter of 06/07/13 (from the past 24 hour(s))  BASIC  METABOLIC PANEL     Status: Abnormal   Collection Time    06/09/13  4:50 AM      Result Value Range   Sodium 122 (*) 135 - 145 mEq/L   Potassium 3.4 (*) 3.5 - 5.1 mEq/L   Chloride 92 (*) 96 - 112 mEq/L   CO2 16 (*) 19 - 32 mEq/L   Glucose, Bld 151 (*) 70 - 99 mg/dL   BUN 31 (*) 6 - 23 mg/dL   Creatinine, Ser 4.09 (*) 0.50 - 1.35 mg/dL   Calcium 8.8  8.4 - 81.1 mg/dL   GFR calc non Af Amer 39 (*) >90 mL/min   GFR calc Af Amer 45 (*) >90 mL/min  CBC     Status: Abnormal   Collection Time    06/09/13  4:50 AM      Result Value Range   WBC 6.1  4.0 - 10.5 K/uL   RBC 4.92  4.22 - 5.81 MIL/uL   Hemoglobin 11.2 (*) 13.0 - 17.0 g/dL    HCT 91.4 (*) 78.2 - 52.0 %   MCV 71.5 (*) 78.0 - 100.0 fL   MCH 22.8 (*) 26.0 - 34.0 pg   MCHC 31.8  30.0 - 36.0 g/dL   RDW 95.6 (*) 21.3 - 08.6 %   Platelets 174  150 - 400 K/uL     No results found.  ROS:  As stated above in the HPI otherwise negative.  Blood pressure 103/62, pulse 115, temperature 97.8 F (36.6 C), temperature source Oral, resp. rate 20, height 5\' 8"  (1.727 m), weight 181 lb (82.1 kg), SpO2 95.00%.    PE: Gen: NAD, Alert and Oriented HEENT:  [place]/AT, EOMI Neck: Supple, no LAD Lungs: CTA Bilaterally CV: RRR without M/G/R ABM: Soft, NTND, +BS Rectal: good resting sphincter tone and voluntary control Ext: No C/C/E  Assessment/Plan: 1) Diarrhea. 2) CHF.   I spoke with nursing and they confirm that he has very small volume bowel movements.  There is no clear indication that he is having fecal incontinence.  Since he has a persistence of his symptoms I will perform a FFS for further evaluation.  I ordered two tap water enemas as he has renal insufficiency, which can be exacerbated with Fleets.  1) FFS tomorrow. Steve Mccarty D 06/09/2013, 6:25 PM

## 2013-06-09 NOTE — Evaluation (Addendum)
Physical Therapy Evaluation Patient Details Name: Steve Mccarty MRN: [id number] DOB: 1945/10/23 Today's Date: 06/09/2013 Time: 4098-1191 PT Time Calculation (min): 13 min  PT Assessment / Plan / Recommendation History of Present Illness  67 y.o. male with h/o ischemic cardiomyopathy, a-fib, kidney disease admitted with diarrhea, SOB, a-fib, hyponatremia.   Clinical Impression  *Pt admitted with SOB, diarrhea, a-fib, hyponatremia*. Pt currently with functional limitations due to the deficits listed below (see PT Problem List). ** Pt will benefit from skilled PT to increase their independence and safety with mobility to allow discharge to the venue listed below. *  **    PT Assessment  Patient needs continued PT services    Follow Up Recommendations  Home health PT    Does the patient have the potential to tolerate intense rehabilitation      Barriers to Discharge        Equipment Recommendations  None recommended by PT    Recommendations for Other Services OT consult   Frequency Min 3X/week    Precautions / Restrictions Precautions Precautions: None Restrictions Weight Bearing Restrictions: No   Pertinent Vitals/Pain *HR 113 prior to PT, 103 after walking 0/10 pain**      Mobility  Bed Mobility Bed Mobility: Not assessed Transfers Transfers: Sit to Stand;Stand to Sit Sit to Stand: 5: Supervision;From chair/3-in-1 Stand to Sit: 5: Supervision;To chair/3-in-1 Ambulation/Gait Ambulation/Gait Assistance: 4: Min guard Ambulation Distance (Feet): 80 Feet Assistive device: None Gait Pattern: Within Functional Limits General Gait Details: very mild LOB x2, pt able to self correct; distance limited due to need to stay close to commode 2* diarrhea    Exercises     PT Diagnosis: Generalized weakness  PT Problem List: Decreased activity tolerance;Decreased mobility PT Treatment Interventions: Gait training;Stair training;Functional mobility training;Therapeutic  exercise;Patient/family education     PT Goals(Current goals can be found in the care plan section) Acute Rehab PT Goals Patient Stated Goal: to get strength back PT Goal Formulation: With patient Time For Goal Achievement: 06/23/13 Potential to Achieve Goals: Good  Visit Information  Last PT Received On: 06/09/13 Assistance Needed: +1 History of Present Illness: 67 y.o. male with h/o ischemicc cardiomyopathy, a-fib, kidney disease admitted with diarrhea, SOB, a-fib, hyponatremia.        Prior Functioning  Home Living Family/patient expects to be discharged to:: Private residence Living Arrangements: Alone Available Help at Discharge: Friend(s);Available PRN/intermittently Type of Home: House Home Access: Stairs to enter [employer] of Steps: 3 Entrance Stairs-Rails: None Home Layout: One level Home Equipment: Walker - 2 wheels;Cane - single point Additional Comments: States he does not have 24/7 supervision/assist, however he could arrange for intermittent care.  Prior Function Level of Independence: Independent Communication Communication: No difficulties Dominant Hand: Right    Cognition  Cognition Arousal/Alertness: Awake/alert Behavior During Therapy: WFL for tasks assessed/performed Overall Cognitive Status: Within Functional Limits for tasks assessed    Extremity/Trunk Assessment Upper Extremity Assessment Upper Extremity Assessment: Overall WFL for tasks assessed Lower Extremity Assessment Lower Extremity Assessment: Overall WFL for tasks assessed Cervical / Trunk Assessment Cervical / Trunk Assessment: Normal   Balance    End of Session PT - End of Session Equipment Utilized During Treatment: Gait belt Activity Tolerance: Patient tolerated treatment well Patient left: in chair;with call bell/phone within reach Nurse Communication: Mobility status  GP     Ralene Bathe Kistler 06/09/2013, 1:02 PM [phone]

## 2013-06-09 NOTE — Progress Notes (Signed)
Subjective:  He says he is feeling better, still have loose stools.  Objective:  Vital Signs in the last 24 hours: Temp:  [97.4 F (36.3 C)-97.8 F (36.6 C)] 97.8 F (36.6 C) (09/29 1455) Pulse Rate:  [94-116] 111 (09/29 1455) Resp:  [18-20] 20 (09/29 1455) BP: (98-115)/(63-81) 100/63 mmHg (09/29 1455) SpO2:  [93 %-98 %] 95 % (09/29 1455)  Intake/Output from previous day:  Intake/Output Summary (Last 24 hours) at 06/09/13 1702 Last data filed at 06/09/13 1457  Gross per 24 hour  Intake   1200 ml  Output      0 ml  Net   1200 ml    Physical Exam: unable to examin, pt on comode   Rate: 110  Rhythm: atrial fibrillation  Lab Results:  Recent Labs  06/08/13 0556 06/09/13 0450  WBC 6.0 6.1  HGB 11.8* 11.2*  PLT 166 174    Recent Labs  06/08/13 0556 06/09/13 0450  NA 123* 122*  K 3.6 3.4*  CL 92* 92*  CO2 15* 16*  GLUCOSE 115* 151*  BUN 27* 31*  CREATININE 1.76* 1.73*    Recent Labs  06/07/13 1550 06/07/13 2152  TROPONINI <0.30 <0.30   No results found for this basename: INR,  in the last 72 hours  Imaging: Imaging results have been reviewed  Cardiac Studies:  Assessment/Plan:   Principal Problem:   Acute respiratory failure with hypoxia Active Problems:   Acute on chronic systolic CHF (congestive heart failure)   Paroxysmal atrial fibrillation   Diabetes mellitus type 2 with complications   Cardiomyopathy, ischemic, EF 25% by echo 11/28/11.(declined ICD)   Chronic right-sided HF (heart failure) - with ascites   CAD - cath '06- no details. Myoview low risk March 2013   Diarrhea   High anion gap metabolic acidosis   CKD (chronic kidney disease) stage 3, GFR 30-59 ml/min   Hypotension on admission 9/27   Diverticular bleed 7/13- (not on chronic anticoagulation now)   Hyponatremia  on admission   Hypertension    PLAN: B/P is still soft making it difficult to titrate Diltiazem or Coreg.  Will change Coreg to Metoprolol as it may have less of  a hypotensive effect. His diuretic and K+ are still on hold. Would try and keep K+ closer to 4.0.  Corine Shelter PA-C Beeper [phone] 06/09/2013, 5:02 PM

## 2013-06-09 NOTE — Progress Notes (Signed)
[facility] PROGRESS NOTE  JAQUIS PICKLESIMER ZOX:[id number] DOB: May 09, 1946 DOA: 06/07/2013 PCP: Thayer Headings, MD  Assessment/Plan: Acute hypoxemic Respiratory Failure -Improved. -CT chest is negative for PE.  A Fib with RVR -rates have improved into the low 100s. -Appreciate cards follow up.  Diarrhea -Continues to be a problem. -Follows up with Dr. Elnoria Howard. -Have left message for Dr. Loreta Ave to see for further recommendations. -C Diff PCR is negative.  High anion-gap acidoses -Suspect related to diarrhea and starvation acidosis. -Continue to improve.  CKD Stage III -At baseline. -Will continue to follow.  Code Status: Full Code Family Communication: Patient only  Disposition Plan:  Sister has concerns that patient is not taking good care of himself, he is willing to go to SNF if recommended.   Consultants:  Cardiology  GI   Antibiotics:  None   Subjective: Feels better today. Still having frequent diarrhea. SOB has improved.  Objective: Filed Vitals:   06/08/13 1720 06/08/13 2111 06/09/13 0520 06/09/13 0731  BP: 110/77 98/71 101/69 115/81  Pulse: 113 99 94 116  Temp:  97.4 F (36.3 C) 97.8 F (36.6 C)   TempSrc:  Oral Oral   Resp:  18 20   Height:      Weight:      SpO2:  98% 93%     Intake/Output Summary (Last 24 hours) at 06/09/13 1353 Last data filed at 06/09/13 1100  Gross per 24 hour  Intake    960 ml  Output      0 ml  Net    960 ml   Filed Weights   06/07/13 0440 06/07/13 0845  Weight: 81.647 kg (180 lb) 82.1 kg (181 lb)    Exam:   General:  AA Ox3  Cardiovascular: irregular  Respiratory: CTA B  Abdomen: S/NT/ND/+BS  Extremities: trace bilateral edema   Neurologic:  Grossly intact and non-focal.  Data Reviewed: Basic Metabolic Panel:  Recent Labs Lab 06/07/13 0515 06/07/13 1019 06/08/13 0556 06/09/13 0450  NA 124*  --  123* 122*  K 3.7  --  3.6 3.4*  CL 91*  --  92* 92*  CO2 12*  --  15* 16*  GLUCOSE 161*   --  115* 151*  BUN 23  --  27* 31*  CREATININE 1.51* 1.44* 1.76* 1.73*  CALCIUM 8.9  --  9.0 8.8   Liver Function Tests:  Recent Labs Lab 06/07/13 0515  AST 37  ALT 22  ALKPHOS 113  BILITOT 1.4*  PROT 7.3  ALBUMIN 3.8   No results found for this basename: LIPASE, AMYLASE,  in the last 168 hours No results found for this basename: AMMONIA,  in the last 168 hours CBC:  Recent Labs Lab 06/07/13 0515 06/07/13 1019 06/08/13 0556 06/09/13 0450  WBC 5.3 4.9 6.0 6.1  NEUTROABS 3.2  --   --   --   HGB 11.9* 11.8* 11.8* 11.2*  HCT 37.3* 36.8* 37.3* 35.2*  MCV 72.4* 71.7* 72.1* 71.5*  PLT 164 145* 166 174   Cardiac Enzymes:  Recent Labs Lab 06/07/13 0515 06/07/13 1019 06/07/13 1550 06/07/13 2152  TROPONINI <0.30 <0.30 <0.30 <0.30   BNP (last 3 results) No results found for this basename: PROBNP,  in the last 8760 hours CBG: No results found for this basename: GLUCAP,  in the last 168 hours  Recent Results (from the past 240 hour(s))  CLOSTRIDIUM DIFFICILE BY PCR     Status: None   Collection Time    06/07/13  9:16  PM      Result Value Range Status   C difficile by pcr NEGATIVE  NEGATIVE Final   Comment: Performed at [facility]     Studies: No results found.  Scheduled Meds: . aspirin EC  81 mg Oral q morning - 10a  . carvedilol  3.125 mg Oral BID WC  . cholecalciferol  1,000 Units Oral q morning - 10a  . diltiazem  30 mg Oral Q8H  . enoxaparin (LOVENOX) injection  40 mg Subcutaneous Daily  . feeding supplement  237 mL Oral BID BM  . multivitamin with minerals  1 tablet Oral q morning - 10a  . pantoprazole  40 mg Oral BID AC  . simvastatin  5 mg Oral q1800  . sodium chloride  3 mL Intravenous Q12H  . sodium chloride  3 mL Intravenous Q12H   Continuous Infusions:   Principal Problem:   Acute respiratory failure with hypoxia Active Problems:   Hypertension   Paroxysmal atrial fibrillation   Diverticular bleed 7/13- (not on chronic  anticoagulation now)   Diabetes mellitus type 2 with complications   Hyponatremia  on admission   Cardiomyopathy, ischemic, EF 25% by echo 11/28/11.(declined ICD)   Chronic right-sided HF (heart failure) - with ascites   CAD - cath '06- no details. Myoview low risk March 2013   Diarrhea   High anion gap metabolic acidosis   Acute on chronic systolic CHF (congestive heart failure)   CKD (chronic kidney disease) stage 3, GFR 30-59 ml/min   Hypotension on admission 9/27    Time spent: 35 minutes    [name],ESTELA  [facility] Pager [phone]  If 7PM-7AM, please contact night-coverage at [email or web address], password [facility] 06/09/2013, 1:53 PM  LOS: 2 days

## 2013-06-09 NOTE — Evaluation (Signed)
Occupational Therapy Evaluation Patient Details Name: Steve Mccarty MRN: [id number] DOB: May 20, 1946 Today's Date: 06/09/2013 Time: 0865-7846 OT Time Calculation (min): 18 min  OT Assessment / Plan / Recommendation History of present illness 67 y.o. male with h/o ischemic cardiomyopathy, a-fib, kidney disease admitted with diarrhea, SOB, a-fib, hyponatremia.    Clinical Impression   Pt presents to OT with decreased I with ADL activity and will benefit from skilled OT to increase I with ADL activity and return to PLOF    OT Assessment  Patient needs continued OT Services    Follow Up Recommendations  Home health OT       Equipment Recommendations  Tub/shower seat       Frequency  Min 2X/week    Precautions / Restrictions Precautions Precautions: None Restrictions Weight Bearing Restrictions: No       ADL  Eating/Feeding: Independent Grooming: Set up Where Assessed - Grooming: Unsupported sitting Lower Body Bathing: Set up;Minimal assistance Where Assessed - Lower Body Bathing: Unsupported sit to stand Upper Body Dressing: Set up Where Assessed - Upper Body Dressing: Unsupported sitting Lower Body Dressing: Minimal assistance Where Assessed - Lower Body Dressing: Unsupported sit to stand Toilet Transfer: Min Pension scheme manager Method: Sit to Barista: Regular height toilet Toileting - Clothing Manipulation and Hygiene: Min guard Where Assessed - Toileting Clothing Manipulation and Hygiene: Standing Transfers/Ambulation Related to ADLs: When discussing pts tub at home- pt agreed he would benefit from a tub seat as he was putting a kitchen chair in the tub which OT explained was a fall risk.  Pt agreed to a tub seat    OT Diagnosis: Generalized weakness  OT Problem List: Decreased strength OT Treatment Interventions: Self-care/ADL training;Patient/family education   OT Goals(Current goals can be found in the care plan section) Acute Rehab  OT Goals Patient Stated Goal: to get strength back OT Goal Formulation: With patient Time For Goal Achievement: 06/16/13 Potential to Achieve Goals: Good ADL Goals Pt Will Perform Grooming: Independently;standing Pt Will Perform Upper Body Dressing: Independently;sitting Pt Will Perform Lower Body Dressing: Independently;sit to/from stand Pt Will Transfer to Toilet: Independently;ambulating;regular height toilet Pt Will Perform Toileting - Clothing Manipulation and hygiene: Independently;sit to/from stand Pt Will Perform Tub/Shower Transfer: Tub transfer;ambulating;shower seat  Visit Information  Last OT Received On: 06/09/13 Assistance Needed: +1 History of Present Illness: 67 y.o. male with h/o ischemic cardiomyopathy, a-fib, kidney disease admitted with diarrhea, SOB, a-fib, hyponatremia.        Prior Functioning     Home Living Family/patient expects to be discharged to:: Private residence Living Arrangements: Alone Available Help at Discharge: Friend(s);Available PRN/intermittently Type of Home: House Home Access: Stairs to enter [employer] of Steps: 3 Entrance Stairs-Rails: None Home Layout: One level Home Equipment: Walker - 2 wheels;Cane - single point Additional Comments: States he does not have 24/7 supervision/assist, however he could arrange for intermittent care.  Prior Function Level of Independence: Independent Communication Communication: No difficulties Dominant Hand: Right         Vision/Perception Vision - History Patient Visual Report: No change from baseline   Cognition  Cognition Arousal/Alertness: Awake/alert Behavior During Therapy: WFL for tasks assessed/performed Overall Cognitive Status: Within Functional Limits for tasks assessed    Extremity/Trunk Assessment Upper Extremity Assessment Upper Extremity Assessment: Overall WFL for tasks assessed Lower Extremity Assessment Lower Extremity Assessment: Overall WFL for tasks  assessed Cervical / Trunk Assessment Cervical / Trunk Assessment: Normal     Mobility Bed Mobility Bed  Mobility: Not assessed Details for Bed Mobility Assistance: pt sitting EOB eating lunch Transfers Sit to Stand: 5: Supervision;From chair/3-in-1 Stand to Sit: 5: Supervision;To chair/3-in-1           End of Session OT - End of Session Activity Tolerance: Patient tolerated treatment well Patient left: in bed  GO     Steve Mccarty, Metro Kung 06/09/2013, 2:13 PM

## 2013-06-10 ENCOUNTER — Encounter (HOSPITAL_COMMUNITY): Payer: Self-pay | Admitting: *Deleted

## 2013-06-10 ENCOUNTER — Encounter (HOSPITAL_COMMUNITY)
Admission: EM | Disposition: A | Discharge status: Nursing Home (Includes ICF) | Payer: Self-pay | Source: Home / Self Care | Attending: Internal Medicine

## 2013-06-10 HISTORY — PX: FLEXIBLE SIGMOIDOSCOPY: SHX5431

## 2013-06-10 LAB — CBC
HCT: 35.3 % — ABNORMAL LOW (ref 39.0–52.0)
Hemoglobin: 11.4 g/dL — ABNORMAL LOW (ref 13.0–17.0)
MCH: 22.8 pg — ABNORMAL LOW (ref 26.0–34.0)
MCHC: 32.3 g/dL (ref 30.0–36.0)
MCV: 70.5 fL — ABNORMAL LOW (ref 78.0–100.0)
Platelets: 224 K/uL (ref 150–400)
RBC: 5.01 MIL/uL (ref 4.22–5.81)
RDW: 17.5 % — ABNORMAL HIGH (ref 11.5–15.5)
WBC: 6.5 K/uL (ref 4.0–10.5)

## 2013-06-10 LAB — BASIC METABOLIC PANEL
Calcium: 8.7 mg/dL (ref 8.4–10.5)
Chloride: 90 mEq/L — ABNORMAL LOW (ref 96–112)
GFR calc Af Amer: 39 mL/min — ABNORMAL LOW (ref >90)
Glucose, Bld: 130 mg/dL — ABNORMAL HIGH (ref 70–99)
Potassium: 3.8 mEq/L (ref 3.5–5.1)
Sodium: 120 mEq/L — ABNORMAL LOW (ref 135–145)

## 2013-06-10 LAB — GLUCOSE, CAPILLARY: Glucose-Capillary: 188 mg/dL — ABNORMAL HIGH (ref 70–99)

## 2013-06-10 SURGERY — SIGMOIDOSCOPY, FLEXIBLE
Anesthesia: Moderate Sedation

## 2013-06-10 MED ORDER — FENTANYL CITRATE 0.05 MG/ML IJ SOLN
INTRAMUSCULAR | Status: DC | PRN
  Administered 2013-06-10 (×2): 25 ug via INTRAVENOUS

## 2013-06-10 MED ORDER — MIDAZOLAM HCL 10 MG/2ML IJ SOLN
INTRAMUSCULAR | Status: DC | PRN
  Administered 2013-06-10 (×2): 2 mg via INTRAVENOUS

## 2013-06-10 MED ORDER — DIPHENHYDRAMINE HCL 50 MG/ML IJ SOLN
INTRAMUSCULAR | Status: AC
  Filled 2013-06-10: qty 1

## 2013-06-10 MED ORDER — FENTANYL CITRATE 0.05 MG/ML IJ SOLN
INTRAMUSCULAR | Status: AC
  Filled 2013-06-10: qty 2

## 2013-06-10 MED ORDER — FUROSEMIDE 10 MG/ML IJ SOLN
40.0000 mg | Freq: Once | INTRAMUSCULAR | Status: AC
  Administered 2013-06-10: 18:00:00 40 mg via INTRAVENOUS
  Filled 2013-06-10: qty 4

## 2013-06-10 MED ORDER — MIDAZOLAM HCL 10 MG/2ML IJ SOLN
INTRAMUSCULAR | Status: AC
  Filled 2013-06-10: qty 2

## 2013-06-10 MED ORDER — LOPERAMIDE HCL 2 MG PO CAPS
4.0000 mg | ORAL_CAPSULE | Freq: Three times a day (TID) | ORAL | Status: DC
  Administered 2013-06-10 – 2013-06-11 (×2): 4 mg via ORAL
  Filled 2013-06-10 (×7): qty 2

## 2013-06-10 MED ORDER — SODIUM BICARBONATE 650 MG PO TABS
650.0000 mg | ORAL_TABLET | Freq: Once | ORAL | Status: AC
  Administered 2013-06-10: 650 mg via ORAL
  Filled 2013-06-10: qty 1

## 2013-06-10 NOTE — Op Note (Signed)
[facility] [address] [place], [address]   FLEXIBLE SIGMOIDOSCOPY PROCEDURE REPORT  PATIENT: Steve Mccarty, Steve Mccarty  MR#: [id number] BIRTHDATE: 1946-04-06 , 66  yrs. old GENDER: Male ENDOSCOPIST: Jeani Hawking, MD REFERRED BY: PROCEDURE DATE:  06/10/2013 PROCEDURE:   Sigmoidoscopy with biopsy ASA CLASS:   Class III INDICATIONS:Diarrhea MEDICATIONS: Versed 4 mg IV and Fentanyl 50 mcg IV  DESCRIPTION OF PROCEDURE:   After the risks benefits and alternatives of the procedure were thoroughly explained, informed consent was obtained.  revealed no abnormalities of the rectum. The endoscope was introduced through the anus  and advanced to the mid transverse colon , limited by No adverse events experienced. The quality of the prep was excellent .  The instrument was then slowly withdrawn as the mucosa was fully examined.         FINDINGS: The endoscope was able to be advanced to the mid/proximal transverse colon.  No overt evidence of any inflammation.  Random cold biopsies were obtained in the colon.  Sigmoid diverticula were again identified.  Retroflexed views revealed internal/external hemorrhoid.    The scope was then withdrawn from the patient and the procedure terminated.  COMPLICATIONS: There were no complications.  ENDOSCOPIC IMPRESSION: 1) Diverticulosis was noted  RECOMMENDATIONS: 1) Await biopsy results. 2) Scheduled Imodium, two tablets TID.  REPEAT EXAM:   _______________________________ eSignedJeani Hawking, MD 06/10/2013 2:31 PM   CC:

## 2013-06-10 NOTE — Progress Notes (Signed)
Dr. Elnoria Howard notified of patient "s level of consciousness.

## 2013-06-10 NOTE — Care Management Note (Addendum)
    Page 1 of 1   06/11/2013     12:18:01 PM   CARE MANAGEMENT NOTE 06/11/2013  Patient:  Steve Mccarty, Steve Mccarty   Account Number:  [id number]  Date Initiated:  06/08/2013  Documentation initiated by:  [facility]  Subjective/Objective Assessment:   67 year old male admitted with SOB, a-fib.     Action/Plan:   From home alone. May need SNF at d/c.   Anticipated DC Date:  06/11/2013   Anticipated DC Plan:  LONG TERM ACUTE CARE (LTAC)  In-house referral  Clinical Social Worker      DC Planning Services  CM consult      Choice offered to / List presented to:             Status of service:  Completed, signed off Medicare Important Message given?  NA - LOS <3 / Initial given by admissions (If response is "NO", the following Medicare IM given date fields will be blank) Date Medicare IM given:   Date Additional Medicare IM given:    Discharge Disposition:  LONG TERM ACUTE CARE (LTAC)  Per UR Regulation:  Reviewed for med. necessity/level of care/duration of stay  If discussed at Long Length of Stay Meetings, dates discussed:    Comments:  06/11/13 Dariyah Garduno RN,BSN NCM [phone] MD AGREED TO LTAC-D/C SUMMARY DONE.LTAC ACCEPTED @ [facility] PER JENNY REP.PATIENT GOING TO RM 5734,RECEIVING DR.HIJAZI,NURSE TO CALL REPORT TO [facility]#[phone].NSG MAMAGING-NEED COBRA FORM COMPLETED,MED NECESSITY FORM,FULL TRANSFER REPORT FOR [facility]-TRANSPORT [facility]#[phone].  06/10/13 Eleanore Junio RN,BSN NCM [phone] RECEIVED REFERRAL FOR LTAC.LTAC APPROPRIATE.PATIENT IN AGREEMENT.DEPENDING ON OUTCOME OF SIGMOIDOSCOPY.DIARRHEA,FOR SIGMOIDOSCOPY.WILL AWAIT RESULTS.GI FOLLOWING.PT-HH.PATIENT HAS CANE,3N1,& RW.

## 2013-06-10 NOTE — Interval H&P Note (Signed)
History and Physical Interval Note:  06/10/2013 2:05 PM  Steve Mccarty  has presented today for surgery, with the diagnosis of Diarrhea  The various methods of treatment have been discussed with the patient and family. After consideration of risks, benefits and other options for treatment, the patient has consented to  Procedure(s): FLEXIBLE SIGMOIDOSCOPY (N/A) as a surgical intervention .  The patient's history has been reviewed, patient examined, no change in status, stable for surgery.  I have reviewed the patient's chart and labs.  Questions were answered to the patient's satisfaction.     Caelen Reierson D

## 2013-06-10 NOTE — H&P (View-Only) (Signed)
[facility] PROGRESS NOTE  Steve Mccarty WUJ:[id number] DOB: 12-06-1945 DOA: 06/07/2013 PCP: Thayer Headings, MD  Assessment/Plan: Acute hypoxemic Respiratory Failure -Improved. -CT chest is negative for PE. -Suspect his tachypnea was a compensatory response for his metabolic acidosis.  A Fib with RVR -rates have improved into the low 90s-100s. -Appreciate cards follow up.  Diarrhea -Continues to be a problem. -C Diff PCR is negative. -Dr. Elnoria Howard is planning on a flex sig for this afternoon to further evaluate. -Etiology remains a mystery.  High anion-gap acidoses -Suspect related to diarrhea and starvation acidosis, probably also has a component of acidosis from his CKD. -Will start on sodium bicarb tablets.  Hyponatremia -He is starting to appear overloaded on exam (has chronic systolic CHF). -Will expedite free water excretion by giving a one time dose of lasix.  CKD Stage III -A little worse than is baseline of around 1.7. -Will continue to follow.  Code Status: Full Code Family Communication: Patient only  Disposition Plan:  Has been approved to go to LTAC once sigmoidoscopy completed.  Consultants:  Cardiology  GI   Antibiotics:  None   Subjective: Feels better today. Still having frequent diarrhea. SOB has improved.  Objective: Filed Vitals:   06/09/13 1756 06/09/13 2127 06/10/13 0437 06/10/13 0835  BP: 103/62 99/73 99/55  108/66  Pulse: 115 104 97 96  Temp:  97.2 F (36.2 C) 97.8 F (36.6 C)   TempSrc:  Oral Oral   Resp:  19 19   Height:      Weight:      SpO2:  100% 100%     Intake/Output Summary (Last 24 hours) at 06/10/13 1235 Last data filed at 06/10/13 1158  Gross per 24 hour  Intake    940 ml  Output      0 ml  Net    940 ml   Filed Weights   06/07/13 0440 06/07/13 0845  Weight: 81.647 kg (180 lb) 82.1 kg (181 lb)    Exam:   General:  AA Ox3  Cardiovascular: irregular  Respiratory: CTA B  Abdomen:  S/NT/ND/+BS  Extremities: 1+ pitting edema.   Neurologic:  Grossly intact and non-focal.  Data Reviewed: Basic Metabolic Panel:  Recent Labs Lab 06/07/13 0515 06/07/13 1019 06/08/13 0556 06/09/13 0450 06/10/13 0455  NA 124*  --  123* 122* 120*  K 3.7  --  3.6 3.4* 3.8  CL 91*  --  92* 92* 90*  CO2 12*  --  15* 16* 15*  GLUCOSE 161*  --  115* 151* 130*  BUN 23  --  27* 31* 36*  CREATININE 1.51* 1.44* 1.76* 1.73* 1.97*  CALCIUM 8.9  --  9.0 8.8 8.7   Liver Function Tests:  Recent Labs Lab 06/07/13 0515  AST 37  ALT 22  ALKPHOS 113  BILITOT 1.4*  PROT 7.3  ALBUMIN 3.8   No results found for this basename: LIPASE, AMYLASE,  in the last 168 hours No results found for this basename: AMMONIA,  in the last 168 hours CBC:  Recent Labs Lab 06/07/13 0515 06/07/13 1019 06/08/13 0556 06/09/13 0450 06/10/13 0455  WBC 5.3 4.9 6.0 6.1 6.5  NEUTROABS 3.2  --   --   --   --   HGB 11.9* 11.8* 11.8* 11.2* 11.4*  HCT 37.3* 36.8* 37.3* 35.2* 35.3*  MCV 72.4* 71.7* 72.1* 71.5* 70.5*  PLT 164 145* 166 174 224   Cardiac Enzymes:  Recent Labs Lab 06/07/13 0515 06/07/13 1019 06/07/13 1550 06/07/13 2152  TROPONINI <0.30 <0.30 <0.30 <0.30   BNP (last 3 results) No results found for this basename: PROBNP,  in the last 8760 hours CBG: No results found for this basename: GLUCAP,  in the last 168 hours  Recent Results (from the past 240 hour(s))  CLOSTRIDIUM DIFFICILE BY PCR     Status: None   Collection Time    06/07/13  9:16 PM      Result Value Range Status   C difficile by pcr NEGATIVE  NEGATIVE Final   Comment: Performed at [facility]     Studies: No results found.  Scheduled Meds: . aspirin EC  81 mg Oral q morning - 10a  . cholecalciferol  1,000 Units Oral q morning - 10a  . diltiazem  30 mg Oral Q8H  . enoxaparin (LOVENOX) injection  40 mg Subcutaneous Daily  . feeding supplement  237 mL Oral BID BM  . furosemide  40 mg Intravenous Once  .  metoprolol tartrate  12.5 mg Oral Q8H  . multivitamin with minerals  1 tablet Oral q morning - 10a  . pantoprazole  40 mg Oral BID AC  . simvastatin  5 mg Oral q1800  . sodium chloride  3 mL Intravenous Q12H  . sodium chloride  3 mL Intravenous Q12H   Continuous Infusions: . sodium chloride 20 mL/hr at 06/09/13 2000    Principal Problem:   Acute respiratory failure with hypoxia Active Problems:   Hypertension   Paroxysmal atrial fibrillation   Diverticular bleed 7/13- (not on chronic anticoagulation now)   Diabetes mellitus type 2 with complications   Hyponatremia  on admission   Cardiomyopathy, ischemic, EF 25% by echo 11/28/11.(declined ICD)   Chronic right-sided HF (heart failure) - with ascites   CAD - cath '06- no details. Myoview low risk March 2013   Diarrhea   High anion gap metabolic acidosis   Acute on chronic systolic CHF (congestive heart failure)   CKD (chronic kidney disease) stage 3, GFR 30-59 ml/min   Hypotension on admission 9/27    Time spent: 35 minutes    Steve Mccarty  [facility] Pager [phone]  If 7PM-7AM, please contact night-coverage at [email or web address], password [email or web address] 06/10/2013, 12:35 PM  LOS: 3 days

## 2013-06-10 NOTE — Progress Notes (Signed)
Nursing reported to me that the patient is confused.  I evaluated the patient and he knows me by name, but he is confused about his location.  He states that he is in [place], [place].  His speech is intact and he is able to move all four extremities equally with no deficits in his strength.  No facial asymmetry and his facial movements appear to be normal.  The pulse ox is at 100% on 2 liters .    He may be having some disorientation from the sedatives, however, he needs to be monitored closely.  I will notify his Hospitalist.

## 2013-06-10 NOTE — Progress Notes (Signed)
[facility] PROGRESS NOTE  Steve Mccarty MRN:[id number] DOB: 09/04/1946 DOA: 06/07/2013 PCP: MACKENZIE,BRIAN, MD  Assessment/Plan: Acute hypoxemic Respiratory Failure -Improved. -CT chest is negative for PE. -Suspect his tachypnea was a compensatory response for his metabolic acidosis.  A Fib with RVR -rates have improved into the low 90s-100s. -Appreciate cards follow up.  Diarrhea -Continues to be a problem. -C Diff PCR is negative. -Dr. Hung is planning on a flex sig for this afternoon to further evaluate. -Etiology remains a mystery.  High anion-gap acidoses -Suspect related to diarrhea and starvation acidosis, probably also has a component of acidosis from his CKD. -Will start on sodium bicarb tablets.  Hyponatremia -He is starting to appear overloaded on exam (has chronic systolic CHF). -Will expedite free water excretion by giving a one time dose of lasix.  CKD Stage III -A little worse than is baseline of around 1.7. -Will continue to follow.  Code Status: Full Code Family Communication: Patient only  Disposition Plan:  Has been approved to go to LTAC once sigmoidoscopy completed.  Consultants:  Cardiology  GI   Antibiotics:  None   Subjective: Feels better today. Still having frequent diarrhea. SOB has improved.  Objective: Filed Vitals:   06/09/13 1756 06/09/13 2127 06/10/13 0437 06/10/13 0835  BP: 103/62 99/73 99/55 108/66  Pulse: 115 104 97 96  Temp:  97.2 F (36.2 C) 97.8 F (36.6 C)   TempSrc:  Oral Oral   Resp:  19 19   Height:      Weight:      SpO2:  100% 100%     Intake/Output Summary (Last 24 hours) at 06/10/13 1235 Last data filed at 06/10/13 1158  Gross per 24 hour  Intake    940 ml  Output      0 ml  Net    940 ml   Filed Weights   06/07/13 0440 06/07/13 0845  Weight: 81.647 kg (180 lb) 82.1 kg (181 lb)    Exam:   General:  AA Ox3  Cardiovascular: irregular  Respiratory: CTA B  Abdomen:  S/NT/ND/+BS  Extremities: 1+ pitting edema.   Neurologic:  Grossly intact and non-focal.  Data Reviewed: Basic Metabolic Panel:  Recent Labs Lab 06/07/13 0515 06/07/13 1019 06/08/13 0556 06/09/13 0450 06/10/13 0455  NA 124*  --  123* 122* 120*  K 3.7  --  3.6 3.4* 3.8  CL 91*  --  92* 92* 90*  CO2 12*  --  15* 16* 15*  GLUCOSE 161*  --  115* 151* 130*  BUN 23  --  27* 31* 36*  CREATININE 1.51* 1.44* 1.76* 1.73* 1.97*  CALCIUM 8.9  --  9.0 8.8 8.7   Liver Function Tests:  Recent Labs Lab 06/07/13 0515  AST 37  ALT 22  ALKPHOS 113  BILITOT 1.4*  PROT 7.3  ALBUMIN 3.8   No results found for this basename: LIPASE, AMYLASE,  in the last 168 hours No results found for this basename: AMMONIA,  in the last 168 hours CBC:  Recent Labs Lab 06/07/13 0515 06/07/13 1019 06/08/13 0556 06/09/13 0450 06/10/13 0455  WBC 5.3 4.9 6.0 6.1 6.5  NEUTROABS 3.2  --   --   --   --   HGB 11.9* 11.8* 11.8* 11.2* 11.4*  HCT 37.3* 36.8* 37.3* 35.2* 35.3*  MCV 72.4* 71.7* 72.1* 71.5* 70.5*  PLT 164 145* 166 174 224   Cardiac Enzymes:  Recent Labs Lab 06/07/13 0515 06/07/13 1019 06/07/13 1550 06/07/13 2152    TROPONINI <0.30 <0.30 <0.30 <0.30   BNP (last 3 results) No results found for this basename: PROBNP,  in the last 8760 hours CBG: No results found for this basename: GLUCAP,  in the last 168 hours  Recent Results (from the past 240 hour(s))  CLOSTRIDIUM DIFFICILE BY PCR     Status: None   Collection Time    06/07/13  9:16 PM      Result Value Range Status   C difficile by pcr NEGATIVE  NEGATIVE Final   Comment: Performed at [facility]     Studies: No results found.  Scheduled Meds: . aspirin EC  81 mg Oral q morning - 10a  . cholecalciferol  1,000 Units Oral q morning - 10a  . diltiazem  30 mg Oral Q8H  . enoxaparin (LOVENOX) injection  40 mg Subcutaneous Daily  . feeding supplement  237 mL Oral BID BM  . furosemide  40 mg Intravenous Once  .  metoprolol tartrate  12.5 mg Oral Q8H  . multivitamin with minerals  1 tablet Oral q morning - 10a  . pantoprazole  40 mg Oral BID AC  . simvastatin  5 mg Oral q1800  . sodium chloride  3 mL Intravenous Q12H  . sodium chloride  3 mL Intravenous Q12H   Continuous Infusions: . sodium chloride 20 mL/hr at 06/09/13 2000    Principal Problem:   Acute respiratory failure with hypoxia Active Problems:   Hypertension   Paroxysmal atrial fibrillation   Diverticular bleed 7/13- (not on chronic anticoagulation now)   Diabetes mellitus type 2 with complications   Hyponatremia  on admission   Cardiomyopathy, ischemic, EF 25% by echo 11/28/11.(declined ICD)   Chronic right-sided HF (heart failure) - with ascites   CAD - cath '06- no details. Myoview low risk March 2013   Diarrhea   High anion gap metabolic acidosis   Acute on chronic systolic CHF (congestive heart failure)   CKD (chronic kidney disease) stage 3, GFR 30-59 ml/min   Hypotension on admission 9/27    Time spent: 35 minutes    [name],ESTELA  [facility] Pager [phone]  If 7PM-7AM, please contact night-coverage at [email or web address], password [email or web address] 06/10/2013, 12:35 PM  LOS: 3 days        

## 2013-06-10 NOTE — Progress Notes (Signed)
Subjective:  He denies SOB, getting prepped for GI tests  Objective:  Vital Signs in the last 24 hours: Temp:  [97.2 F (36.2 C)-97.8 F (36.6 C)] 97.8 F (36.6 C) (09/30 0437) Pulse Rate:  [96-115] 96 (09/30 0835) Resp:  [19-20] 19 (09/30 0437) BP: (99-108)/(55-73) 108/66 mmHg (09/30 0835) SpO2:  [95 %-100 %] 100 % (09/30 0437)  Intake/Output from previous day:  Intake/Output Summary (Last 24 hours) at 06/10/13 1104 Last data filed at 06/10/13 0855  Gross per 24 hour  Intake    940 ml  Output      0 ml  Net    940 ml    Physical Exam: General appearance: alert, cooperative and no distress Lungs: clear to auscultation bilaterally Heart: irregularly irregular rhythm   Rate: 80  Rhythm: atrial fibrillation  Lab Results:  Recent Labs  06/09/13 0450 06/10/13 0455  WBC 6.1 6.5  HGB 11.2* 11.4*  PLT 174 224    Recent Labs  06/09/13 0450 06/10/13 0455  NA 122* 120*  K 3.4* 3.8  CL 92* 90*  CO2 16* 15*  GLUCOSE 151* 130*  BUN 31* 36*  CREATININE 1.73* 1.97*    Recent Labs  06/07/13 1550 06/07/13 2152  TROPONINI <0.30 <0.30   No results found for this basename: INR,  in the last 72 hours  Imaging: Imaging results have been reviewed  Cardiac Studies:  Assessment/Plan:   Principal Problem:   Acute respiratory failure with hypoxia Active Problems:   Acute on chronic systolic CHF (congestive heart failure)   Paroxysmal atrial fibrillation   Diabetes mellitus type 2 with complications   Cardiomyopathy, ischemic, EF 25% by echo 11/28/11.(declined ICD)   Chronic right-sided HF (heart failure) - with ascites   CAD - cath '06- no details. Myoview low risk March 2013   Diarrhea   High anion gap metabolic acidosis   CKD (chronic kidney disease) stage 3, GFR 30-59 ml/min   Hypotension on admission 9/27   Diverticular bleed 7/13- (not on chronic anticoagulation now)   Hyponatremia  on admission   Hypertension    PLAN: His rate is better on Lopressor.  His Bun/SCr slightly elevated despite being off diuretics for several days- will follow.  Corine Shelter PA-C Beeper [phone] 06/10/2013, 11:04 AM

## 2013-06-10 NOTE — Progress Notes (Signed)
Agree with the NP/PA-C note as written.  Steve Mccarty was in endoscopy when I looked to see him. Will follow-up tomorrow.  Chrystie Nose, MD, [facility] Attending Cardiologist [facility]

## 2013-06-10 NOTE — Progress Notes (Signed)
Pt returned from endo oriented to self and place, but not to time or situation.  He recognized this nurse, but was very irritable toward another tech and easily agitated. Bed alarm on and will continue to monitor closely.  He is steady on his feet as we helped him to the [facility]

## 2013-06-11 ENCOUNTER — Inpatient Hospital Stay
Admission: AD | Admit: 2013-06-11 | Discharge: 2013-07-08 | Disposition: A | Discharge status: Skilled Nursing Facility | Payer: Self-pay | Source: Ambulatory Visit | Attending: Internal Medicine | Admitting: Internal Medicine

## 2013-06-11 ENCOUNTER — Encounter (HOSPITAL_COMMUNITY): Payer: Self-pay | Admitting: Gastroenterology

## 2013-06-11 LAB — BASIC METABOLIC PANEL
Calcium: 8.9 mg/dL (ref 8.4–10.5)
Creatinine, Ser: 2.09 mg/dL — ABNORMAL HIGH (ref 0.50–1.35)
GFR calc Af Amer: 36 mL/min — ABNORMAL LOW (ref >90)
GFR calc non Af Amer: 31 mL/min — ABNORMAL LOW (ref >90)
Sodium: 126 mEq/L — ABNORMAL LOW (ref 135–145)

## 2013-06-11 LAB — CBC
HCT: 33.4 % — ABNORMAL LOW (ref 39.0–52.0)
Platelets: 193 10*3/uL (ref 150–400)
RBC: 4.71 MIL/uL (ref 4.22–5.81)
RDW: 17.4 % — ABNORMAL HIGH (ref 11.5–15.5)
WBC: 6.6 10*3/uL (ref 4.0–10.5)

## 2013-06-11 MED ORDER — ENSURE COMPLETE PO LIQD: 237.0000 mL | Freq: Two times a day (BID) | ORAL | Status: AC

## 2013-06-11 MED ORDER — METOPROLOL TARTRATE 12.5 MG HALF TABLET: 12.5000 mg | ORAL_TABLET | Freq: Three times a day (TID) | ORAL | Status: AC

## 2013-06-11 MED ORDER — DILTIAZEM HCL 30 MG PO TABS: 30.0000 mg | ORAL_TABLET | Freq: Three times a day (TID) | ORAL | Status: DC

## 2013-06-11 MED ORDER — LOPERAMIDE HCL 2 MG PO CAPS: 4.0000 mg | ORAL_CAPSULE | Freq: Three times a day (TID) | ORAL | Status: AC

## 2013-06-11 NOTE — Progress Notes (Signed)
Report called to RN at [facility]. [facility] called for transport, and report given.

## 2013-06-11 NOTE — Discharge Summary (Addendum)
Physician Discharge Summary  Steve Mccarty ZOX:[id number] DOB: 08-03-1946 DOA: 06/07/2013  PCP: Thayer Headings, MD  Admit date: 06/07/2013 Discharge date: 06/11/2013  Recommendations for Outpatient Follow-up:  1. Pt will need to follow up with PCP in 2 weeks post discharge 2. Please obtain BMP to evaluate electrolytes and kidney function 3. 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 Myoview low risk March 2013   Diarrhea   High anion gap metabolic acidosis   Acute on chronic systolic CHF (congestive heart failure)   CKD (chronic kidney disease) stage 3, GFR 30-59 ml/min   Hypotension on admission 9/27 Acute hypoxemic Respiratory Failure  -likely due to fluid overload/CHF -Patient has gained approximately 1.1 kg since the time of admission -Improved.  -remains stable on 2 liters [place] -CT chest is negative for PE.  -Suspect his tachypnea was a compensatory response for his metabolic acidosis.  A Fib with RVR  -rates have improved into the low 90s-100s.  -Spoke with cardiology who has cleared him for transfer to LTAC -restart torsemide 40mg  daily po at [facility] -Continue current dosing of diltiazem and metoprolol tartrate -Continue aspirin -Not a candidate for warfarin secondary to GI bleed -Follow up Dr. Rennis Golden after discharge Diarrhea  -Continues to be a problem. ??microscopic colitis -Spoke with GI-Dr. Elnoria Howard who cleared him for LTAC -continue Imodium 4mg  every 8 hours per Dr. Elnoria Howard pending biopsy results -may decrease imodium if loose  stools improve--pt states stools are less voluminous -Sigmoidoscopy on 06/10/2013 only showed diverticulosis -C Diff PCR is negative.  -Dr. Elnoria Howard is planning on a flex sig for this afternoon to further evaluate.  -Etiology remains unclear High anion-gap acidoses  -Multifactorial including diarrhea and starvation acidosis, probably also has a component of acidosis from his CKD.  -may need to start sodium bicarb tablets if no improvement with diuresis and control of diarrhea Hyponatremia  -Likely due hypervolemic hyponatremia/fluid overload -He is starting to appear overloaded on exam (has chronic systolic CHF).  -Will expedite free water excretion by giving a one time dose of lasix 40 IV on 9/30 -Shows improvement after one dose of IV furosemide -Restart torsemide at [facility] CKD Stage III  -A little worse than is baseline of around 1.7-2.0  -follow closely on torsemide -restart aldactone and monitor closely Code Status: Full Code  Family Communication: Patient only  Disposition Plan: Has been approved to go to LTAC once sigmoidoscopy completed.  Consultants:  Cardiology  GI   Discharge Condition: stable  Disposition:  Follow-up Information   Follow up with Abelino Derrick, PA-C On 07/03/2013. (1:40pm)    Specialty:  Cardiology   Contact information:   [address] [place] [address] [phone]     LTAC  Diet:cardiac Wt Readings from Last 3 Encounters:  06/11/13 82.7 kg (182 lb 5.1 oz)  06/11/13 82.7 kg (182 lb 5.1 oz)  04/10/13 80.967 kg (178 lb 8 oz)    History of present illness:   67 year old white man with multiple medical comorbidities including ischemic cardiomyopathy with a known ejection fraction of  25%, atrial fibrillation who has not been maintained on anticoagulation given GI bleed, hypertension, chronic kidney disease stage III. He states that about 9 weeks ago he started a new medication for his gout (colchicine) which caused him to have  diarrhea. He discontinued this medication however has continued to have diarrhea about once every hour to 2 hours. He went to see his gastroenterologist, Dr. Elnoria Howard, who recommended he use Imodium and Kaopectate without relief. He has become more sedentary at home and has been using diapers because he is afraid of leaving his house and having diarrhea. He has hardly been able to tolerate any oral intake as he thinks it makes his diarrhea worse, in fact he states that over the past 2 weeks he has only had a few doughnuts as it seems to be the only thing that he has an appetite for. He comes in the hospital today for evaluation of his diarrhea. He also states that for about 3 weeks he has been having increasing shortness of breath, to the point where it has become more difficult for him to perform his activities of daily living. In the emergency department he is found to be hyponatremic with high anion gap metabolic acidosis.  He was also initially noted to be in atrial fibrillation with rapid ventricular response with a rate of about 150s. He was given 10 mg of Cardizem as a bolus with the intention of converting him into a drip, however he became hypotensive and Cardizem had to be discontinued. Cardiology was consulted. They started the patient on metoprolol tartrate and diltiazem by mouth. The patient's heart rate remains controlled in the low to mid 90s.    Discharge Exam: Filed Vitals:   06/11/13 0548  BP: 96/64  Pulse: 99  Temp: 97.2 F (36.2 C)  Resp: 20   Filed Vitals:   06/10/13 2115 06/10/13 2321 06/11/13 0100 06/11/13 0548  BP: 99/78 114/100 108/58 96/64  Pulse: 107 103 103 99  Temp:  97.1 F (36.2 C) 97.5 F (36.4 C) 97.2 F (36.2 C)  TempSrc:  Oral Oral Oral  Resp: 22 20 20 20   Height:      Weight:    82.7 kg (182 lb 5.1 oz)  SpO2: 97% 98% 93% 100%   General: A&O x 3, NAD, pleasant, cooperative Cardiovascular: IRRR, no rub,  Respiratory: Diminished breath sounds right base. Left  basilar crackles. No wheezing. Good air movement. Abdomen:soft, nontender, nondistended, positive bowel sounds Extremities: 1+ edema, No lymphangitis, no petechiae  Discharge Instructions      Discharge Orders   Future Appointments Provider Department Dept Phone   07/03/2013 1:40 PM Abelino Derrick, PA-C [facility] [phone]   Future Orders Complete By Expires   Diet - low sodium heart healthy  As directed    Increase activity slowly  As directed        Medication List    STOP taking these medications       carvedilol 3.125 MG tablet  Commonly known as:  COREG     docusate sodium 100 MG capsule  Commonly known as:  COLACE     PHILLIPS 500 MG (LAX) Tabs  Generic drug:  Magnesium Oxide      TAKE these medications       aspirin EC 81 MG tablet  Take 81 mg by mouth every morning.     cholecalciferol 1000 UNITS tablet  Commonly known as:  VITAMIN D  Take 1,000 Units by mouth every morning.  diltiazem 30 MG tablet  Commonly known as:  CARDIZEM  Take 1 tablet (30 mg total) by mouth every 8 (eight) hours.     feeding supplement Liqd  Take 237 mLs by mouth 2 (two) times daily between meals.     loperamide 2 MG capsule  Commonly known as:  IMODIUM  Take 2 capsules (4 mg total) by mouth 3 (three) times daily.     metoprolol tartrate 12.5 mg Tabs tablet  Commonly known as:  LOPRESSOR  Take 0.5 tablets (12.5 mg total) by mouth every 8 (eight) hours.     multivitamin with minerals Tabs tablet  Take 1 tablet by mouth every morning.     pantoprazole 40 MG tablet  Commonly known as:  PROTONIX  Take 1 tablet (40 mg total) by mouth 2 (two) times daily before a meal.     potassium chloride SA 20 MEQ tablet  Commonly known as:  K-DUR,KLOR-CON  Take 20 mEq by mouth every morning.     pravastatin 40 MG tablet  Commonly known as:  PRAVACHOL  Take 40 mg by mouth every evening.     spironolactone 50 MG tablet  Commonly known as:  ALDACTONE  Take 25 mg  by mouth every morning.     torsemide 20 MG tablet  Commonly known as:  DEMADEX  Take 40 mg by mouth every morning. Take 2(20mg )tablets  By mouth a day     traMADol 50 MG tablet  Commonly known as:  ULTRAM  Take 50 mg by mouth 2 (two) times daily as needed for pain. pain         The results of significant diagnostics from this hospitalization (including imaging, microbiology, ancillary and laboratory) are listed below for reference.    Significant Diagnostic Studies: Dg Chest 2 View  06/07/2013   *RADIOLOGY REPORT*  Clinical Data: History of heart failure  CHEST - 2 VIEW  Comparison: Prior chest x-ray 06/03/2012  Findings: Interval development of small bilateral pleural effusions and associated mild bibasilar atelectasis.  Slightly increased pulmonary vascular congestion without overt edema. Progressed cardiomegaly.  Atherosclerotic calcifications noted in the transverse aorta.  No acute osseous abnormality  IMPRESSION:  1.  Trace bilateral pleural effusions with associated bibasilar atelectasis. 2.  Mild pulmonary vascular congestion without overt edema. 3.  Progressed cardiomegaly compared to 06/03/2012.   Original Report Authenticated By: Malachy Moan, M.D.   Dg Abd 1 View  06/07/2013   *RADIOLOGY REPORT* diarrhea, nausea  Clinical Data: And diarrhea, nausea  ABDOMEN - 1 VIEW  Comparison: Prior abdominal ultrasound 02/29/2012  Findings: Two radiographs used to encompass the entirety of the abdomen.  Abnormal bowel gas pattern with gas in multiple loops of nondilated small bowel.  Gas and stool are noted the descending colon.  No massive free air on this supine series.  Multilevel degenerative changes in the thoracic and lumbar spine.  Chronic- appearing L1 vertebral compression fracture.  No acute osseous abnormality.  IMPRESSION:  Gas noted within multiple loops of nondilated small bowel.  This is a nonspecific appearance and can be seen with gastroenteritis, reactive ileus and early  developing obstruction.   Original Report Authenticated By: Malachy Moan, M.D.   Ct Angio Chest Pe W/cm &/or Wo Cm  06/07/2013   CLINICAL DATA:  Paroxysmal atrial fibrillation. Ischemic cardiomyopathy. Chronic right-sided heart failure. Initial evaluation for worsening shortness of breath over the past 3 weeks such that he is unable to perform his activities of daily living. Hyponatremia on current laboratory  data.  EXAM: CT ANGIOGRAPHY CHEST WITH CONTRAST  TECHNIQUE: Multidetector CT imaging of the chest was performed using the standard protocol during bolus administration of intravenous contrast. Multiplanar CT image reconstructions including MIPs were obtained to evaluate the vascular anatomy.  CONTRAST:  OMNIPAQUE IOHEXOL 350 MG/ML IV.  COMPARISON:  No prior CT.  Multiple prior chest x-rays  FINDINGS: Contrast opacification is of the pulmonary arteries is excellent. No filling defects within either main pulmonary artery or their branches in either lung to suggest pulmonary embolism. Cardiac silhouette moderately enlarged with biventricular enlargement. Small pericardial effusion. Extensive 3 vessel coronary atherosclerosis. Aortic annular and moderate aortic valvular calcification. Mild atherosclerosis involving the thoracic and upper abdominal aorta and their branches without evidence of aneurysm.  Moderately large right pleural effusion and small left pleural effusion. Passive atelectasis in the right lower lobe. No evidence of interstitial pulmonary edema. No confluent airspace consolidation elsewhere. No pulmonary parenchymal nodules or masses. Central airways patent with mild bronchial wall thickening.  Multiple normal sized lymph nodes throughout the mediastinum and in both hila. No significant lymphadenopathy. Thyroid gland normal in appearance. Mild bilateral gynecomastia.  Moderate ascites in the visualized upper abdomen. Remaining visualized upper abdomen unremarkable. Bone window images  demonstrate degenerative changes and DISH throughout the thoracic spine.  Review of the MIP images confirms the above findings.  IMPRESSION: 1. No evidence of pulmonary embolism. 2. Moderately large right pleural effusion with associated passive atelectasis in the right lower lobe. Small left pleural effusion. 3. No evidence of interstitial pulmonary edema. 4. Cardiomegaly with biventricular enlargement. Small pericardial effusion. 5. Extensive 3 vessel coronary atherosclerosis. Moderate aortic valvular calcification. 6. Moderate ascites in the visualized upper abdomen. 7. Mild bilateral gynecomastia.   Electronically Signed   By: Hulan Saas   On: 06/07/2013 11:50     Microbiology: Recent Results (from the past 240 hour(s))  CLOSTRIDIUM DIFFICILE BY PCR     Status: None   Collection Time    06/07/13  9:16 PM      Result Value Range Status   C difficile by pcr NEGATIVE  NEGATIVE Final   Comment: Performed at [facility]     Labs: Basic Metabolic Panel:  Recent Labs Lab 06/07/13 0515 06/07/13 1019 06/08/13 0556 06/09/13 0450 06/10/13 0455 06/11/13 0500  NA 124*  --  123* 122* 120* 126*  K 3.7  --  3.6 3.4* 3.8 3.4*  CL 91*  --  92* 92* 90* 95*  CO2 12*  --  15* 16* 15* 16*  GLUCOSE 161*  --  115* 151* 130* 111*  BUN 23  --  27* 31* 36* 40*  CREATININE 1.51* 1.44* 1.76* 1.73* 1.97* 2.09*  CALCIUM 8.9  --  9.0 8.8 8.7 8.9   Liver Function Tests:  Recent Labs Lab 06/07/13 0515  AST 37  ALT 22  ALKPHOS 113  BILITOT 1.4*  PROT 7.3  ALBUMIN 3.8   No results found for this basename: LIPASE, AMYLASE,  in the last 168 hours No results found for this basename: AMMONIA,  in the last 168 hours CBC:  Recent Labs Lab 06/07/13 0515 06/07/13 1019 06/08/13 0556 06/09/13 0450 06/10/13 0455 06/11/13 0500  WBC 5.3 4.9 6.0 6.1 6.5 6.6  NEUTROABS 3.2  --   --   --   --   --   HGB 11.9* 11.8* 11.8* 11.2* 11.4* 10.7*  HCT 37.3* 36.8* 37.3* 35.2* 35.3* 33.4*  MCV  72.4* 71.7* 72.1* 71.5* 70.5* 70.9*  PLT 164 145* 166 174 224 193   Cardiac Enzymes:  Recent Labs Lab 06/07/13 0515 06/07/13 1019 06/07/13 1550 06/07/13 2152  TROPONINI <0.30 <0.30 <0.30 <0.30   BNP: No components found with this basename: POCBNP,  CBG:  Recent Labs Lab 06/10/13 1605  GLUCAP 188*    Time coordinating discharge:  Greater than 30 minutes  Signed:  Dickie Labarre, DO [facility] Pager: [phone] 06/11/2013, 9:27 AM

## 2013-06-11 NOTE — Progress Notes (Signed)
Occupational Therapy Treatment Patient Details Name: Steve Mccarty MRN: [id number] DOB: [birth year] Today's Date: 06/11/2013 Time: 7829-5621 OT Time Calculation (min): 23 min  OT Assessment / Plan / Recommendation  History of present illness 67 y.o. male with h/o ischemic cardiomyopathy, a-fib, kidney disease admitted with diarrhea, SOB, a-fib, hyponatremia.       Follow Up Recommendations  Supervision/Assistance - 24 hour       Equipment Recommendations  Tub/shower seat       Frequency Min 2X/week   Progress towards OT Goals Progress towards OT goals: Progressing toward goals  Plan Discharge plan needs to be updated    Precautions / Restrictions Precautions Precautions: Fall Precaution Comments: pt does become anxious and moves quickly       ADL  Grooming: Set up Where Assessed - Grooming: Unsupported sitting Lower Body Dressing: Minimal assistance Where Assessed - Lower Body Dressing: Unsupported sit to stand Toilet Transfer: Min Pension scheme manager Method: Sit to stand;Stand pivot Acupuncturist: Bedside commode Toileting - Clothing Manipulation and Hygiene: Min guard Where Assessed - Toileting Clothing Manipulation and Hygiene: Standing Transfers/Ambulation Related to ADLs: needed verbal cues to slow down and move safely.  Pt stated he was feeling anxious from all that had gone on this morning.  Pt stated he couldnt go home as he was now and knew he needed help until he got better    ons:     OT Goals(current goals can now be found in the care plan section) Acute Rehab OT Goals Patient Stated Goal: to get strength back OT Goal Formulation: With patient Time For Goal Achievement: 06/16/13 Potential to Achieve Goals: Good  Visit Information  Last OT Received On: 06/11/13 Assistance Needed: +1 History of Present Illness: 67 y.o. male with h/o ischemic cardiomyopathy, a-fib, kidney disease admitted with diarrhea, SOB, a-fib, hyponatremia.           Cognition  Cognition Arousal/Alertness: Awake/alert Behavior During Therapy: WFL for tasks assessed/performed Overall Cognitive Status: Within Functional Limits for tasks assessed    Mobility  Bed Mobility Bed Mobility: Supine to Sit Supine to Sit: 5: Supervision;With rails;HOB flat Transfers Transfers: Sit to Stand;Stand to Sit Sit to Stand: 4: Min guard;From bed;From toilet;From chair/3-in-1 Stand to Sit: 4: Min guard;To toilet;To chair/3-in-1          End of Session OT - End of Session Activity Tolerance: Patient tolerated treatment well Patient left: in chair;with call bell/phone within reach  GO     Dmiya Malphrus, [place]ung 06/11/2013, 11:30 AM

## 2013-06-12 LAB — CBC WITH DIFFERENTIAL/PLATELET
Basophils Absolute: 0.1 10*3/uL (ref 0.0–0.1)
Basophils Relative: 1 % (ref 0–1)
Eosinophils Absolute: 0.1 10*3/uL (ref 0.0–0.7)
Eosinophils Relative: 2 % (ref 0–5)
HCT: 34.9 % — ABNORMAL LOW (ref 39.0–52.0)
Hemoglobin: 11.5 g/dL — ABNORMAL LOW (ref 13.0–17.0)
MCH: 23.2 pg — ABNORMAL LOW (ref 26.0–34.0)
MCHC: 33 g/dL (ref 30.0–36.0)
Monocytes Absolute: 0.8 10*3/uL (ref 0.1–1.0)
Monocytes Relative: 12 % (ref 3–12)
Neutro Abs: 4 10*3/uL (ref 1.7–7.7)
Platelets: 222 10*3/uL (ref 150–400)
RDW: 17.6 % — ABNORMAL HIGH (ref 11.5–15.5)

## 2013-06-12 LAB — COMPREHENSIVE METABOLIC PANEL
AST: 60 U/L — ABNORMAL HIGH (ref 0–37)
Albumin: 3.7 g/dL (ref 3.5–5.2)
Calcium: 8.7 mg/dL (ref 8.4–10.5)
Chloride: 92 mEq/L — ABNORMAL LOW (ref 96–112)
Creatinine, Ser: 2.03 mg/dL — ABNORMAL HIGH (ref 0.50–1.35)
GFR calc Af Amer: 37 mL/min — ABNORMAL LOW (ref >90)

## 2013-06-12 LAB — MAGNESIUM: Magnesium: 2.3 mg/dL (ref 1.5–2.5)

## 2013-06-12 LAB — PREALBUMIN: Prealbumin: 6.2 mg/dL — ABNORMAL LOW (ref 17.0–34.0)

## 2013-06-12 LAB — PHOSPHORUS: Phosphorus: 3.4 mg/dL (ref 2.3–4.6)

## 2013-06-13 LAB — BASIC METABOLIC PANEL
Calcium: 8.5 mg/dL (ref 8.4–10.5)
Chloride: 93 mEq/L — ABNORMAL LOW (ref 96–112)
GFR calc Af Amer: 32 mL/min — ABNORMAL LOW (ref >90)
Glucose, Bld: 155 mg/dL — ABNORMAL HIGH (ref 70–99)
Potassium: 3.7 mEq/L (ref 3.5–5.1)

## 2013-06-14 ENCOUNTER — Other Ambulatory Visit (HOSPITAL_COMMUNITY): Payer: Self-pay

## 2013-06-14 LAB — BASIC METABOLIC PANEL
BUN: 49 mg/dL — ABNORMAL HIGH (ref 6–23)
CO2: 11 mEq/L — ABNORMAL LOW (ref 19–32)
Chloride: 95 mEq/L — ABNORMAL LOW (ref 96–112)
Sodium: 128 mEq/L — ABNORMAL LOW (ref 135–145)

## 2013-06-15 ENCOUNTER — Other Ambulatory Visit (HOSPITAL_COMMUNITY): Payer: Medicare Other

## 2013-06-15 LAB — BASIC METABOLIC PANEL
BUN: 56 mg/dL — ABNORMAL HIGH (ref 6–23)
CO2: 18 mEq/L — ABNORMAL LOW (ref 19–32)
Calcium: 8.8 mg/dL (ref 8.4–10.5)
Chloride: 90 mEq/L — ABNORMAL LOW (ref 96–112)
Creatinine, Ser: 3.34 mg/dL — ABNORMAL HIGH (ref 0.50–1.35)
Glucose, Bld: 152 mg/dL — ABNORMAL HIGH (ref 70–99)

## 2013-06-15 LAB — CBC
HCT: 35.6 % — ABNORMAL LOW (ref 39.0–52.0)
MCV: 71.2 fL — ABNORMAL LOW (ref 78.0–100.0)
Platelets: 273 10*3/uL (ref 150–400)
RBC: 5 MIL/uL (ref 4.22–5.81)
WBC: 7.3 10*3/uL (ref 4.0–10.5)

## 2013-06-16 ENCOUNTER — Other Ambulatory Visit (HOSPITAL_COMMUNITY): Payer: Self-pay

## 2013-06-16 LAB — COMPREHENSIVE METABOLIC PANEL
ALT: 87 U/L — ABNORMAL HIGH (ref 0–53)
Albumin: 3.5 g/dL (ref 3.5–5.2)
BUN: 62 mg/dL — ABNORMAL HIGH (ref 6–23)
Chloride: 90 mEq/L — ABNORMAL LOW (ref 96–112)
Creatinine, Ser: 3.65 mg/dL — ABNORMAL HIGH (ref 0.50–1.35)
GFR calc Af Amer: 18 mL/min — ABNORMAL LOW (ref >90)
GFR calc non Af Amer: 16 mL/min — ABNORMAL LOW (ref >90)
Glucose, Bld: 142 mg/dL — ABNORMAL HIGH (ref 70–99)
Total Bilirubin: 2.1 mg/dL — ABNORMAL HIGH (ref 0.3–1.2)

## 2013-06-16 LAB — CBC
HCT: 32.4 % — ABNORMAL LOW (ref 39.0–52.0)
Hemoglobin: 10.8 g/dL — ABNORMAL LOW (ref 13.0–17.0)
MCH: 23.1 pg — ABNORMAL LOW (ref 26.0–34.0)
MCV: 69.2 fL — ABNORMAL LOW (ref 78.0–100.0)
Platelets: 224 10*3/uL (ref 150–400)
RDW: 18 % — ABNORMAL HIGH (ref 11.5–15.5)
WBC: 8 10*3/uL (ref 4.0–10.5)

## 2013-06-16 LAB — URINALYSIS, ROUTINE W REFLEX MICROSCOPIC
Hgb urine dipstick: NEGATIVE
Ketones, ur: NEGATIVE mg/dL
Leukocytes, UA: NEGATIVE
Nitrite: NEGATIVE
Protein, ur: NEGATIVE mg/dL
Specific Gravity, Urine: 1.016 (ref 1.005–1.030)
Urobilinogen, UA: 0.2 mg/dL (ref 0.0–1.0)

## 2013-06-16 LAB — PREALBUMIN: Prealbumin: 5.9 mg/dL — ABNORMAL LOW (ref 17.0–34.0)

## 2013-06-17 LAB — BASIC METABOLIC PANEL
CO2: 19 mEq/L (ref 19–32)
GFR calc non Af Amer: 22 mL/min — ABNORMAL LOW (ref >90)
Glucose, Bld: 125 mg/dL — ABNORMAL HIGH (ref 70–99)
Potassium: 4.1 mEq/L (ref 3.5–5.1)
Sodium: 126 mEq/L — ABNORMAL LOW (ref 135–145)

## 2013-06-17 LAB — URINE CULTURE: Colony Count: NO GROWTH

## 2013-06-17 LAB — ANA: Anti Nuclear Antibody(ANA): NEGATIVE

## 2013-06-18 ENCOUNTER — Other Ambulatory Visit (HOSPITAL_COMMUNITY): Payer: Self-pay

## 2013-06-18 LAB — PROTEIN ELECTROPHORESIS, SERUM
Albumin ELP: 53.6 % — ABNORMAL LOW (ref 55.8–66.1)
Alpha-2-Globulin: 9.2 % (ref 7.1–11.8)
Beta Globulin: 12.1 % — ABNORMAL HIGH (ref 4.7–7.2)
Total Protein ELP: 6.6 g/dL (ref 6.0–8.3)

## 2013-06-18 LAB — CBC
HCT: 33.1 % — ABNORMAL LOW (ref 39.0–52.0)
Hemoglobin: 10.6 g/dL — ABNORMAL LOW (ref 13.0–17.0)
MCH: 22.8 pg — ABNORMAL LOW (ref 26.0–34.0)
MCV: 71.2 fL — ABNORMAL LOW (ref 78.0–100.0)
Platelets: 211 10*3/uL (ref 150–400)
RBC: 4.65 MIL/uL (ref 4.22–5.81)
WBC: 6.7 10*3/uL (ref 4.0–10.5)

## 2013-06-18 LAB — BASIC METABOLIC PANEL
CO2: 18 mEq/L — ABNORMAL LOW (ref 19–32)
Calcium: 8.9 mg/dL (ref 8.4–10.5)
Chloride: 92 mEq/L — ABNORMAL LOW (ref 96–112)
Glucose, Bld: 116 mg/dL — ABNORMAL HIGH (ref 70–99)
Sodium: 126 mEq/L — ABNORMAL LOW (ref 135–145)

## 2013-06-19 LAB — BASIC METABOLIC PANEL
BUN: 39 mg/dL — ABNORMAL HIGH (ref 6–23)
BUN: 34 mg/dL — ABNORMAL HIGH (ref 6–23)
CO2: 19 mEq/L (ref 19–32)
CO2: 21 mEq/L (ref 19–32)
Chloride: 96 mEq/L (ref 96–112)
Chloride: 98 mEq/L (ref 96–112)
Creatinine, Ser: 1.61 mg/dL — ABNORMAL HIGH (ref 0.50–1.35)
GFR calc Af Amer: 49 mL/min — ABNORMAL LOW (ref >90)
Glucose, Bld: 115 mg/dL — ABNORMAL HIGH (ref 70–99)
Glucose, Bld: 117 mg/dL — ABNORMAL HIGH (ref 70–99)
Potassium: 4.1 mEq/L (ref 3.5–5.1)
Potassium: 5 mEq/L (ref 3.5–5.1)
Sodium: 130 mEq/L — ABNORMAL LOW (ref 135–145)
Sodium: 131 mEq/L — ABNORMAL LOW (ref 135–145)

## 2013-06-20 LAB — BASIC METABOLIC PANEL
BUN: 30 mg/dL — ABNORMAL HIGH (ref 6–23)
CO2: 19 mEq/L (ref 19–32)
Calcium: 8.7 mg/dL (ref 8.4–10.5)
Chloride: 101 mEq/L (ref 96–112)
Creatinine, Ser: 1.5 mg/dL — ABNORMAL HIGH (ref 0.50–1.35)
Glucose, Bld: 116 mg/dL — ABNORMAL HIGH (ref 70–99)
Potassium: 5.1 mEq/L (ref 3.5–5.1)

## 2013-06-21 LAB — BASIC METABOLIC PANEL
BUN: 29 mg/dL — ABNORMAL HIGH (ref 6–23)
CO2: 21 mEq/L (ref 19–32)
Calcium: 8.9 mg/dL (ref 8.4–10.5)
Glucose, Bld: 120 mg/dL — ABNORMAL HIGH (ref 70–99)
Sodium: 128 mEq/L — ABNORMAL LOW (ref 135–145)

## 2013-06-22 LAB — BASIC METABOLIC PANEL
BUN: 30 mg/dL — ABNORMAL HIGH (ref 6–23)
CO2: 21 mEq/L (ref 19–32)
Calcium: 9 mg/dL (ref 8.4–10.5)
Chloride: 92 mEq/L — ABNORMAL LOW (ref 96–112)
Creatinine, Ser: 1.64 mg/dL — ABNORMAL HIGH (ref 0.50–1.35)
GFR calc non Af Amer: 42 mL/min — ABNORMAL LOW (ref >90)
Glucose, Bld: 111 mg/dL — ABNORMAL HIGH (ref 70–99)
Sodium: 127 mEq/L — ABNORMAL LOW (ref 135–145)

## 2013-06-22 LAB — CBC
Hemoglobin: 10.7 g/dL — ABNORMAL LOW (ref 13.0–17.0)
MCH: 22.5 pg — ABNORMAL LOW (ref 26.0–34.0)
MCHC: 32 g/dL (ref 30.0–36.0)
MCV: 70.2 fL — ABNORMAL LOW (ref 78.0–100.0)
Platelets: 173 10*3/uL (ref 150–400)
RBC: 4.76 MIL/uL (ref 4.22–5.81)

## 2013-06-22 LAB — PRO B NATRIURETIC PEPTIDE: Pro B Natriuretic peptide (BNP): 9184 pg/mL — ABNORMAL HIGH (ref 0–125)

## 2013-06-23 LAB — BASIC METABOLIC PANEL
BUN: 31 mg/dL — ABNORMAL HIGH (ref 6–23)
CO2: 23 mEq/L (ref 19–32)
Chloride: 93 mEq/L — ABNORMAL LOW (ref 96–112)
Creatinine, Ser: 1.53 mg/dL — ABNORMAL HIGH (ref 0.50–1.35)
Glucose, Bld: 102 mg/dL — ABNORMAL HIGH (ref 70–99)
Potassium: 4 mEq/L (ref 3.5–5.1)

## 2013-06-25 LAB — BASIC METABOLIC PANEL
BUN: 34 mg/dL — ABNORMAL HIGH (ref 6–23)
Calcium: 8.9 mg/dL (ref 8.4–10.5)
Creatinine, Ser: 1.63 mg/dL — ABNORMAL HIGH (ref 0.50–1.35)
GFR calc non Af Amer: 42 mL/min — ABNORMAL LOW (ref >90)
Glucose, Bld: 129 mg/dL — ABNORMAL HIGH (ref 70–99)

## 2013-06-25 LAB — CBC
HCT: 32.2 % — ABNORMAL LOW (ref 39.0–52.0)
Hemoglobin: 10.5 g/dL — ABNORMAL LOW (ref 13.0–17.0)
MCH: 23 pg — ABNORMAL LOW (ref 26.0–34.0)
MCHC: 32.6 g/dL (ref 30.0–36.0)
MCV: 70.6 fL — ABNORMAL LOW (ref 78.0–100.0)

## 2013-06-26 LAB — BASIC METABOLIC PANEL
BUN: 34 mg/dL — ABNORMAL HIGH (ref 6–23)
Chloride: 93 mEq/L — ABNORMAL LOW (ref 96–112)
Creatinine, Ser: 1.71 mg/dL — ABNORMAL HIGH (ref 0.50–1.35)
GFR calc Af Amer: 46 mL/min — ABNORMAL LOW (ref >90)
GFR calc non Af Amer: 40 mL/min — ABNORMAL LOW (ref >90)
Sodium: 127 mEq/L — ABNORMAL LOW (ref 135–145)

## 2013-06-27 LAB — BASIC METABOLIC PANEL
CO2: 19 mEq/L (ref 19–32)
Calcium: 8.9 mg/dL (ref 8.4–10.5)
Creatinine, Ser: 1.84 mg/dL — ABNORMAL HIGH (ref 0.50–1.35)
Potassium: 4.4 mEq/L (ref 3.5–5.1)
Sodium: 128 mEq/L — ABNORMAL LOW (ref 135–145)

## 2013-06-30 LAB — CBC WITH DIFFERENTIAL/PLATELET
Basophils Relative: 1 % (ref 0–1)
Eosinophils Absolute: 0.2 10*3/uL (ref 0.0–0.7)
Hemoglobin: 11.1 g/dL — ABNORMAL LOW (ref 13.0–17.0)
Lymphs Abs: 1.3 10*3/uL (ref 0.7–4.0)
MCH: 22.9 pg — ABNORMAL LOW (ref 26.0–34.0)
MCHC: 31.4 g/dL (ref 30.0–36.0)
Monocytes Absolute: 1.3 10*3/uL — ABNORMAL HIGH (ref 0.1–1.0)
Monocytes Relative: 17 % — ABNORMAL HIGH (ref 3–12)
Neutro Abs: 4.6 10*3/uL (ref 1.7–7.7)
Neutrophils Relative %: 63 % (ref 43–77)
RBC: 4.84 MIL/uL (ref 4.22–5.81)
WBC: 7.5 10*3/uL (ref 4.0–10.5)

## 2013-06-30 LAB — COMPREHENSIVE METABOLIC PANEL
AST: 33 U/L (ref 0–37)
BUN: 41 mg/dL — ABNORMAL HIGH (ref 6–23)
Calcium: 9.1 mg/dL (ref 8.4–10.5)
Creatinine, Ser: 2.06 mg/dL — ABNORMAL HIGH (ref 0.50–1.35)
GFR calc Af Amer: 37 mL/min — ABNORMAL LOW (ref >90)
Glucose, Bld: 131 mg/dL — ABNORMAL HIGH (ref 70–99)
Total Protein: 7.2 g/dL (ref 6.0–8.3)

## 2013-06-30 LAB — URINALYSIS, ROUTINE W REFLEX MICROSCOPIC
Bilirubin Urine: NEGATIVE
Hgb urine dipstick: NEGATIVE
Ketones, ur: NEGATIVE mg/dL
Leukocytes, UA: NEGATIVE
Nitrite: NEGATIVE
Protein, ur: NEGATIVE mg/dL
Urobilinogen, UA: 0.2 mg/dL (ref 0.0–1.0)
pH: 5 (ref 5.0–8.0)

## 2013-06-30 LAB — PRO B NATRIURETIC PEPTIDE: Pro B Natriuretic peptide (BNP): 12246 pg/mL — ABNORMAL HIGH (ref 0–125)

## 2013-07-01 ENCOUNTER — Other Ambulatory Visit (HOSPITAL_COMMUNITY): Payer: Self-pay

## 2013-07-01 LAB — CBC WITH DIFFERENTIAL/PLATELET
Basophils Absolute: 0.1 10*3/uL (ref 0.0–0.1)
Basophils Relative: 1 % (ref 0–1)
Eosinophils Absolute: 0.1 10*3/uL (ref 0.0–0.7)
Hemoglobin: 11.6 g/dL — ABNORMAL LOW (ref 13.0–17.0)
Lymphocytes Relative: 16 % (ref 12–46)
MCHC: 31 g/dL (ref 30.0–36.0)
Monocytes Relative: 13 % — ABNORMAL HIGH (ref 3–12)
Neutrophils Relative %: 69 % (ref 43–77)
RDW: 23.2 % — ABNORMAL HIGH (ref 11.5–15.5)
WBC: 6.9 10*3/uL (ref 4.0–10.5)

## 2013-07-01 LAB — BASIC METABOLIC PANEL
CO2: 16 mEq/L — ABNORMAL LOW (ref 19–32)
Calcium: 9.2 mg/dL (ref 8.4–10.5)
Calcium: 9.6 mg/dL (ref 8.4–10.5)
Chloride: 96 mEq/L (ref 96–112)
Creatinine, Ser: 1.89 mg/dL — ABNORMAL HIGH (ref 0.50–1.35)
Creatinine, Ser: 2.35 mg/dL — ABNORMAL HIGH (ref 0.50–1.35)
GFR calc Af Amer: 41 mL/min — ABNORMAL LOW (ref >90)
GFR calc Af Amer: 31 mL/min — ABNORMAL LOW (ref >90)
Glucose, Bld: 106 mg/dL — ABNORMAL HIGH (ref 70–99)
Potassium: 5.1 mEq/L (ref 3.5–5.1)
Sodium: 130 mEq/L — ABNORMAL LOW (ref 135–145)
Sodium: 130 mEq/L — ABNORMAL LOW (ref 135–145)

## 2013-07-01 LAB — URINE CULTURE: Colony Count: NO GROWTH

## 2013-07-01 LAB — CLOSTRIDIUM DIFFICILE BY PCR: Toxigenic C. Difficile by PCR: NEGATIVE

## 2013-07-02 ENCOUNTER — Other Ambulatory Visit (HOSPITAL_COMMUNITY): Payer: Self-pay

## 2013-07-02 LAB — CBC WITH DIFFERENTIAL/PLATELET
Basophils Relative: 0 % (ref 0–1)
Eosinophils Relative: 0 % (ref 0–5)
HCT: 35.7 % — ABNORMAL LOW (ref 39.0–52.0)
Lymphocytes Relative: 15 % (ref 12–46)
Monocytes Absolute: 0.9 10*3/uL (ref 0.1–1.0)
Monocytes Relative: 12 % (ref 3–12)
Neutrophils Relative %: 73 % (ref 43–77)
Platelets: 242 10*3/uL (ref 150–400)
RBC: 4.77 MIL/uL (ref 4.22–5.81)
WBC: 7.3 10*3/uL (ref 4.0–10.5)

## 2013-07-02 LAB — COMPREHENSIVE METABOLIC PANEL
ALT: 57 U/L — ABNORMAL HIGH (ref 0–53)
AST: 112 U/L — ABNORMAL HIGH (ref 0–37)
Alkaline Phosphatase: 130 U/L — ABNORMAL HIGH (ref 39–117)
CO2: 13 mEq/L — ABNORMAL LOW (ref 19–32)
Calcium: 9.8 mg/dL (ref 8.4–10.5)
Chloride: 91 mEq/L — ABNORMAL LOW (ref 96–112)
Creatinine, Ser: 2.76 mg/dL — ABNORMAL HIGH (ref 0.50–1.35)
GFR calc Af Amer: 26 mL/min — ABNORMAL LOW (ref >90)
GFR calc non Af Amer: 22 mL/min — ABNORMAL LOW (ref >90)
Glucose, Bld: 80 mg/dL (ref 70–99)
Potassium: 5.8 mEq/L — ABNORMAL HIGH (ref 3.5–5.1)
Sodium: 129 mEq/L — ABNORMAL LOW (ref 135–145)

## 2013-07-02 LAB — HEPATIC FUNCTION PANEL
ALT: 80 U/L — ABNORMAL HIGH (ref 0–53)
Albumin: 3.9 g/dL (ref 3.5–5.2)
Alkaline Phosphatase: 126 U/L — ABNORMAL HIGH (ref 39–117)
Indirect Bilirubin: 2 mg/dL — ABNORMAL HIGH (ref 0.3–0.9)
Total Bilirubin: 4.5 mg/dL — ABNORMAL HIGH (ref 0.3–1.2)
Total Protein: 7.2 g/dL (ref 6.0–8.3)

## 2013-07-03 ENCOUNTER — Other Ambulatory Visit (HOSPITAL_COMMUNITY): Payer: Self-pay

## 2013-07-03 ENCOUNTER — Ambulatory Visit: Payer: Medicare Other | Admitting: Cardiology

## 2013-07-03 LAB — COMPREHENSIVE METABOLIC PANEL
ALT: 173 U/L — ABNORMAL HIGH (ref 0–53)
Alkaline Phosphatase: 117 U/L (ref 39–117)
BUN: 65 mg/dL — ABNORMAL HIGH (ref 6–23)
CO2: 14 mEq/L — ABNORMAL LOW (ref 19–32)
Calcium: 9.2 mg/dL (ref 8.4–10.5)
Creatinine, Ser: 3.22 mg/dL — ABNORMAL HIGH (ref 0.50–1.35)
GFR calc Af Amer: 21 mL/min — ABNORMAL LOW (ref >90)
GFR calc non Af Amer: 18 mL/min — ABNORMAL LOW (ref >90)
Glucose, Bld: 108 mg/dL — ABNORMAL HIGH (ref 70–99)
Sodium: 127 mEq/L — ABNORMAL LOW (ref 135–145)

## 2013-07-03 LAB — CBC WITH DIFFERENTIAL/PLATELET
Basophils Relative: 0 % (ref 0–1)
Eosinophils Relative: 0 % (ref 0–5)
HCT: 31.8 % — ABNORMAL LOW (ref 39.0–52.0)
Hemoglobin: 10.4 g/dL — ABNORMAL LOW (ref 13.0–17.0)
Lymphs Abs: 0.5 10*3/uL — ABNORMAL LOW (ref 0.7–4.0)
MCH: 23.6 pg — ABNORMAL LOW (ref 26.0–34.0)
MCV: 72.1 fL — ABNORMAL LOW (ref 78.0–100.0)
Monocytes Absolute: 1.1 10*3/uL — ABNORMAL HIGH (ref 0.1–1.0)
Monocytes Relative: 13 % — ABNORMAL HIGH (ref 3–12)
Neutro Abs: 6.8 10*3/uL (ref 1.7–7.7)
Neutrophils Relative %: 81 % — ABNORMAL HIGH (ref 43–77)
Platelets: 199 10*3/uL (ref 150–400)
RBC: 4.41 MIL/uL (ref 4.22–5.81)
WBC: 8.4 10*3/uL (ref 4.0–10.5)

## 2013-07-03 LAB — BODY FLUID CELL COUNT WITH DIFFERENTIAL
Eos, Fluid: 0 %
Monocyte-Macrophage-Serous Fluid: 24 % — ABNORMAL LOW (ref 50–90)
Neutrophil Count, Fluid: 36 % — ABNORMAL HIGH (ref 0–25)
Total Nucleated Cell Count, Fluid: 445 cu mm (ref 0–1000)

## 2013-07-03 LAB — GRAM STAIN: Special Requests: 60

## 2013-07-03 LAB — LACTATE DEHYDROGENASE, PLEURAL OR PERITONEAL FLUID: LD, Fluid: 78 U/L — ABNORMAL HIGH (ref 3–23)

## 2013-07-03 NOTE — Procedures (Signed)
Successful US guided paracentesis from RLQ.  Yielded 4.1 liters of yellow/amber colored fluid.  No immediate complications.  Pt tolerated well.   Specimen was sent for labs.  Pattricia Boss D PA-C 07/03/2013 11:40 AM

## 2013-07-04 LAB — BASIC METABOLIC PANEL
GFR calc Af Amer: 30 mL/min — ABNORMAL LOW (ref >90)
GFR calc non Af Amer: 26 mL/min — ABNORMAL LOW (ref >90)
Glucose, Bld: 80 mg/dL (ref 70–99)
Potassium: 3.4 mEq/L — ABNORMAL LOW (ref 3.5–5.1)
Sodium: 135 mEq/L (ref 135–145)

## 2013-07-04 LAB — CBC
Hemoglobin: 10.2 g/dL — ABNORMAL LOW (ref 13.0–17.0)
MCH: 23.8 pg — ABNORMAL LOW (ref 26.0–34.0)
MCV: 70.8 fL — ABNORMAL LOW (ref 78.0–100.0)
Platelets: 147 10*3/uL — ABNORMAL LOW (ref 150–400)
RBC: 4.28 MIL/uL (ref 4.22–5.81)
RDW: 23.6 % — ABNORMAL HIGH (ref 11.5–15.5)
WBC: 4.4 10*3/uL (ref 4.0–10.5)

## 2013-07-04 LAB — AMMONIA: Ammonia: 42 umol/L (ref 11–60)

## 2013-07-05 LAB — CBC WITH DIFFERENTIAL/PLATELET
Basophils Absolute: 0 10*3/uL (ref 0.0–0.1)
Basophils Relative: 0 % (ref 0–1)
Eosinophils Absolute: 0 10*3/uL (ref 0.0–0.7)
HCT: 32.7 % — ABNORMAL LOW (ref 39.0–52.0)
Hemoglobin: 10.8 g/dL — ABNORMAL LOW (ref 13.0–17.0)
Lymphocytes Relative: 10 % — ABNORMAL LOW (ref 12–46)
MCH: 23.5 pg — ABNORMAL LOW (ref 26.0–34.0)
Monocytes Absolute: 0.6 10*3/uL (ref 0.1–1.0)
Monocytes Relative: 11 % (ref 3–12)
Neutro Abs: 4.3 10*3/uL (ref 1.7–7.7)
Neutrophils Relative %: 79 % — ABNORMAL HIGH (ref 43–77)
RDW: 24.2 % — ABNORMAL HIGH (ref 11.5–15.5)
WBC: 5.4 10*3/uL (ref 4.0–10.5)

## 2013-07-05 LAB — BASIC METABOLIC PANEL WITH GFR
BUN: 41 mg/dL — ABNORMAL HIGH (ref 6–23)
CO2: 25 meq/L (ref 19–32)
Calcium: 8.7 mg/dL (ref 8.4–10.5)
Chloride: 97 meq/L (ref 96–112)
Creatinine, Ser: 1.58 mg/dL — ABNORMAL HIGH (ref 0.50–1.35)
GFR calc Af Amer: 51 mL/min — ABNORMAL LOW
GFR calc non Af Amer: 44 mL/min — ABNORMAL LOW
Glucose, Bld: 120 mg/dL — ABNORMAL HIGH (ref 70–99)
Potassium: 2.8 meq/L — ABNORMAL LOW (ref 3.5–5.1)
Sodium: 139 meq/L (ref 135–145)

## 2013-07-05 LAB — MAGNESIUM: Magnesium: 1.8 mg/dL (ref 1.5–2.5)

## 2013-07-05 LAB — VANCOMYCIN, TROUGH: Vancomycin Tr: <5 ug/mL — ABNORMAL LOW (ref 10.0–20.0)

## 2013-07-05 LAB — PHOSPHORUS: Phosphorus: 2.6 mg/dL (ref 2.3–4.6)

## 2013-07-06 LAB — BASIC METABOLIC PANEL
BUN: 37 mg/dL — ABNORMAL HIGH (ref 6–23)
CO2: 24 mEq/L (ref 19–32)
Chloride: 94 mEq/L — ABNORMAL LOW (ref 96–112)
Creatinine, Ser: 1.42 mg/dL — ABNORMAL HIGH (ref 0.50–1.35)

## 2013-07-06 LAB — CBC
HCT: 34 % — ABNORMAL LOW (ref 39.0–52.0)
Hemoglobin: 11.3 g/dL — ABNORMAL LOW (ref 13.0–17.0)
MCV: 70.2 fL — ABNORMAL LOW (ref 78.0–100.0)
Platelets: 155 10*3/uL (ref 150–400)
RBC: 4.84 MIL/uL (ref 4.22–5.81)
WBC: 6.5 10*3/uL (ref 4.0–10.5)

## 2013-07-06 LAB — POTASSIUM: Potassium: 3.8 mEq/L (ref 3.5–5.1)

## 2013-07-07 ENCOUNTER — Other Ambulatory Visit (HOSPITAL_COMMUNITY): Payer: Medicare Other

## 2013-07-07 LAB — CBC
HCT: 35.2 % — ABNORMAL LOW (ref 39.0–52.0)
MCV: 70.5 fL — ABNORMAL LOW (ref 78.0–100.0)
Platelets: 169 10*3/uL (ref 150–400)
RBC: 4.99 MIL/uL (ref 4.22–5.81)
WBC: 7.8 10*3/uL (ref 4.0–10.5)

## 2013-07-07 LAB — MAGNESIUM: Magnesium: 1.7 mg/dL (ref 1.5–2.5)

## 2013-07-07 LAB — BASIC METABOLIC PANEL
BUN: 36 mg/dL — ABNORMAL HIGH (ref 6–23)
CO2: 23 mEq/L (ref 19–32)
Chloride: 87 mEq/L — ABNORMAL LOW (ref 96–112)
Creatinine, Ser: 1.31 mg/dL (ref 0.50–1.35)
Sodium: 125 mEq/L — ABNORMAL LOW (ref 135–145)

## 2013-07-07 LAB — BODY FLUID CULTURE: Culture: NO GROWTH

## 2013-07-07 LAB — PRO B NATRIURETIC PEPTIDE: Pro B Natriuretic peptide (BNP): 9511 pg/mL — ABNORMAL HIGH (ref 0–125)

## 2013-07-07 LAB — PHOSPHORUS: Phosphorus: 2.1 mg/dL — ABNORMAL LOW (ref 2.3–4.6)

## 2013-07-08 LAB — CULTURE, BLOOD (ROUTINE X 2)

## 2013-07-11 ENCOUNTER — Non-Acute Institutional Stay (SKILLED_NURSING_FACILITY): Payer: Medicare Other | Admitting: Internal Medicine

## 2013-07-11 DIAGNOSIS — I480 Paroxysmal atrial fibrillation: Secondary | ICD-10-CM

## 2013-07-11 DIAGNOSIS — R197 Diarrhea, unspecified: Secondary | ICD-10-CM

## 2013-07-11 DIAGNOSIS — E871 Hypo-osmolality and hyponatremia: Secondary | ICD-10-CM

## 2013-07-11 DIAGNOSIS — R21 Rash and other nonspecific skin eruption: Secondary | ICD-10-CM

## 2013-07-11 DIAGNOSIS — I50812 Chronic right heart failure: Secondary | ICD-10-CM

## 2013-07-11 DIAGNOSIS — R5381 Other malaise: Secondary | ICD-10-CM

## 2013-07-11 DIAGNOSIS — E785 Hyperlipidemia, unspecified: Secondary | ICD-10-CM

## 2013-07-11 DIAGNOSIS — I509 Heart failure, unspecified: Secondary | ICD-10-CM

## 2013-07-11 DIAGNOSIS — I4891 Unspecified atrial fibrillation: Secondary | ICD-10-CM

## 2013-07-11 NOTE — Progress Notes (Signed)
Patient ID: Steve Mccarty, male   DOB: [birth year], 67 y.o.   MRN: [id number]  Steve Mccarty living Steve Mccarty   PCP: Thayer Headings, MD  Code Status: DNR  Allergies  Allergen Reactions  . Colcrys [Colchicine] Diarrhea    Chief Complaint: new admit  HPI:  67 y/o male patient is here for STR post hospital admission with diarrhea and worsening dyspnea. He had hyponatremia and anion gap acidosis. He also had afib with RVR and required cardizem drip. Cardiology was consulted given his low bp and he was put on both cardizem and metoprolol by mouth. He was seen by Gi for ? Colitis and paln was to continue imodium for now. He has hx of ischemic cardiomyopathy, afib, HTN, ckd He was seen in his room today with his nursing supervisor present. He complaints of rash on his back. He denies any other complaints. He would like to get strong and leave the facility  Review of Systems:  Constitutional: Negative for fever, chills, weight loss, malaise/fatigue and diaphoresis.  HENT: Negative for congestion, hearing loss and sore throat.   Eyes: Negative for blurred vision. His glasses are broken Respiratory: Negative for cough, hemoptysis and shortness of breath.   Cardiovascular: Negative for chest pain, orthopnea and leg swelling.  Gastrointestinal: Negative for heartburn, nausea, vomiting, abdominal pain and blood in stool. Has diarrhea Genitourinary: Negative for dysuria.  Musculoskeletal: Negative for falls. Weakness present Skin: Positive for itching and rash.  Neurological: Negative for dizziness, seizures and headaches.  Psychiatric/Behavioral: Negative for depression and memory loss.    Past Medical History  Diagnosis Date  . Atrial fibrillation   . Hypertension   . Coronary artery disease   . CHF (congestive heart failure)     perpheral edema  . Chronic renal disease, stage III   . Joint pain   . Aortic valve insufficiency, rheumatic   . Diabetes mellitus     Was for 10 years,  stopped Metformin 2010  . GERD (gastroesophageal reflux disease)   . Blood in stool   . Cardiomyopathy, ischemic, EF 25% by echo 11/28/11. 06/03/2012  . Chronic right-sided HF (heart failure)     ascites  . History of nuclear stress test 11/28/2011    mild perfusion defect in mid inferior, basal inferolateral & mid inferolateral region; low-risk  . History of Doppler ultrasound 11/28/2011    L ant tibial - occlusive disease with re-constitution at dorsalis pedis   Past Surgical History  Procedure Laterality Date  . Tonsillectomy    . Cardiac catheterization  2006    Dr. Aleen Campi  . Esophagogastroduodenoscopy  11/03/2011    Procedure: ESOPHAGOGASTRODUODENOSCOPY (EGD);  Surgeon: Theda Belfast, MD;  Location: [facility];  Service: Endoscopy;  Laterality: N/A;  . Colonoscopy  11/03/2011    Procedure: COLONOSCOPY;  Surgeon: Theda Belfast, MD;  Location: [facility];  Service: Endoscopy;  Laterality: N/A;  . Esophagogastroduodenoscopy  03/22/2012    Procedure: ESOPHAGOGASTRODUODENOSCOPY (EGD);  Surgeon: Theda Belfast, MD;  Location: [facility];  Service: Endoscopy;  Laterality: N/A;  . Esophagogastroduodenoscopy  04/04/2012    Procedure: ESOPHAGOGASTRODUODENOSCOPY (EGD);  Surgeon: Theda Belfast, MD;  Location: [facility];  Service: Endoscopy;  Laterality: N/A;  . Flexible sigmoidoscopy N/A 06/10/2013    Procedure: FLEXIBLE SIGMOIDOSCOPY;  Surgeon: Theda Belfast, MD;  Location: [facility];  Service: Endoscopy;  Laterality: N/A;   Social History:   reports that he has never smoked. He has never used smokeless tobacco. He reports that he drinks alcohol. He  reports that he does not use illicit drugs.  Family History  Problem Relation Age of Onset  . Congestive Heart Failure Mother   . Cancer Sister     Medications: Patient's Medications  New Prescriptions   No medications on file  Previous Medications   ASPIRIN EC 81 MG TABLET    Take 81 mg by mouth every morning.     CHOLECALCIFEROL (VITAMIN D) 1000 UNITS TABLET    Take 1,000 Units by mouth every morning.    DILTIAZEM (CARDIZEM) 30 MG TABLET    Take 1 tablet (30 mg total) by mouth every 8 (eight) hours.   FEEDING SUPPLEMENT (ENSURE COMPLETE) LIQD    Take 237 mLs by mouth 2 (two) times daily between meals.   LOPERAMIDE (IMODIUM) 2 MG CAPSULE    Take 2 capsules (4 mg total) by mouth 3 (three) times daily.   METOPROLOL TARTRATE (LOPRESSOR) 12.5 MG TABS TABLET    Take 0.5 tablets (12.5 mg total) by mouth every 8 (eight) hours.   MULTIPLE VITAMIN (MULTIVITAMIN WITH MINERALS) TABS    Take 1 tablet by mouth every morning.    PANTOPRAZOLE (PROTONIX) 40 MG TABLET    Take 1 tablet (40 mg total) by mouth 2 (two) times daily before a meal.   POTASSIUM CHLORIDE SA (K-DUR,KLOR-CON) 20 MEQ TABLET    Take 20 mEq by mouth every morning.   PRAVASTATIN (PRAVACHOL) 40 MG TABLET    Take 40 mg by mouth every evening.    SPIRONOLACTONE (ALDACTONE) 50 MG TABLET    Take 25 mg by mouth every morning.    TORSEMIDE (DEMADEX) 20 MG TABLET    Take 40 mg by mouth every morning. Take 2(20mg )tablets  By mouth a day   TRAMADOL (ULTRAM) 50 MG TABLET    Take 50 mg by mouth 2 (two) times daily as needed for pain. pain  Modified Medications   No medications on file  Discontinued Medications   No medications on file     Physical Exam:  Filed Vitals:   07/11/13 1453  BP: 126/72  Pulse: 74  Temp: 98.2 F (36.8 C)  Resp: 16  SpO2: 92%   General- elderly male in no acute distress Head- atraumatic, normocephalic Eyes- PERRLA, EOMI, no pallor, no icterus Neck- no lymphadenopathy, no thyromegaly, no jugular vein distension, no carotid bruit Chest- no chest wall deformities, no chest wall tenderness Cardiovascular- normal s1,s2, no murmurs/ rubs/ gallops Respiratory- bilateral clear to auscultation, no wheeze, no rhonchi, no crackles Abdomen- bowel sounds present, soft, non tender, no organomegaly, no abdominal bruits, no guarding or  rigidity, no CVA tenderness Musculoskeletal- able to move all 4 extremities, no spinal and paraspinal tenderness, using wheelchair and walker. Edema 1+ in both legs Neurological- no focal deficit, normal reflexes, normal muscle strength, normal sensation to fine touch and vibration Psychiatry- alert and oriented to person, place and time, normal mood and affect Skin- thickened skin with erythematous rash on his back, has redness in his buttock area around anal area   Labs reviewed: Basic Metabolic Panel:  Recent Labs  29/56/21 0650  07/05/13 0630 07/05/13 2315 07/06/13 0535 07/07/13 0555  NA 124*  < > 139  --  132* 125*  K 3.4*  < > 2.8* 3.8 4.2 4.3  CL 92*  < > 97  --  94* 87*  CO2 17*  < > 25  --  24 23  GLUCOSE 108*  < > 120*  --  145* 165*  BUN 41*  < >  41*  --  37* 36*  CREATININE 2.03*  < > 1.58*  --  1.42* 1.31  CALCIUM 8.7  < > 8.7  --  8.5 8.8  MG 2.3  --  1.8  --   --  1.7  PHOS 3.4  --  2.6  --   --  2.1*  < > = values in this interval not displayed. Liver Function Tests:  Recent Labs  07/02/13 0519 07/02/13 0847 07/03/13 0540  AST 112* 178* 318*  ALT 57* 80* 173*  ALKPHOS 130* 126* 117  BILITOT 4.5* 4.5* 4.5*  PROT 7.3 7.2 6.9  ALBUMIN 4.0 3.9 3.8   No results found for this basename: LIPASE, AMYLASE,  in the last 8760 hours  Recent Labs  07/02/13 0847 07/04/13 0610  AMMONIA 64* 42   CBC:  Recent Labs  07/02/13 0519 07/03/13 0540  07/05/13 0630 07/06/13 0535 07/07/13 0555  WBC 7.3 8.4  < > 5.4 6.5 7.8  NEUTROABS 5.3 6.8  --  4.3  --   --   HGB 10.7* 10.4*  < > 10.8* 11.3* 11.6*  HCT 35.7* 31.8*  < > 32.7* 34.0* 35.2*  MCV 74.8* 72.1*  < > 71.1* 70.2* 70.5*  PLT 242 199  < > 151 155 169  < > = values in this interval not displayed. Cardiac Enzymes:  Recent Labs  06/07/13 1019 06/07/13 1550 06/07/13 2152  TROPONINI <0.30 <0.30 <0.30   BNP: No components found with this basename: POCBNP,  CBG:  Recent Labs  06/10/13 1605   GLUCAP 188*    Radiological Exams: Dg Chest 2 View  06/07/2013   *RADIOLOGY REPORT*  Clinical Data: History of heart failure  CHEST - 2 VIEW  Comparison: Prior chest x-ray 06/03/2012  Findings: Interval development of small bilateral pleural effusions and associated mild bibasilar atelectasis.  Slightly increased pulmonary vascular congestion without overt edema. Progressed cardiomegaly.  Atherosclerotic calcifications noted in the transverse aorta.  No acute osseous abnormality  IMPRESSION:  1.  Trace bilateral pleural effusions with associated bibasilar atelectasis. 2.  Mild pulmonary vascular congestion without overt edema. 3.  Progressed cardiomegaly compared to 06/03/2012.   Original Report Authenticated By: Malachy Moan, M.D.   Dg Abd 1 View  06/07/2013   *RADIOLOGY REPORT* diarrhea, nausea  Clinical Data: And diarrhea, nausea  ABDOMEN - 1 VIEW  Comparison: Prior abdominal ultrasound 02/29/2012  Findings: Two radiographs used to encompass the entirety of the abdomen.  Abnormal bowel gas pattern with gas in multiple loops of nondilated small bowel.  Gas and stool are noted the descending colon.  No massive free air on this supine series.  Multilevel degenerative changes in the thoracic and lumbar spine.  Chronic- appearing L1 vertebral compression fracture.  No acute osseous abnormality.  IMPRESSION:  Gas noted within multiple loops of nondilated small bowel.  This is a nonspecific appearance and can be seen with gastroenteritis, reactive ileus and early developing obstruction.   Original Report Authenticated By: Malachy Moan, M.D.   Ct Angio Chest Pe W/cm &/or Wo Cm  06/07/2013   CLINICAL DATA:  Paroxysmal atrial fibrillation. Ischemic cardiomyopathy. Chronic right-sided heart failure. Initial evaluation for worsening shortness of breath over the past 3 weeks such that he is unable to perform his activities of daily living. Hyponatremia on current laboratory data.  EXAM: CT ANGIOGRAPHY  CHEST WITH CONTRAST  TECHNIQUE: Multidetector CT imaging of the chest was performed using the standard protocol during bolus administration of intravenous contrast. Multiplanar CT  image reconstructions including MIPs were obtained to evaluate the vascular anatomy.  CONTRAST:  OMNIPAQUE IOHEXOL 350 MG/ML IV.  COMPARISON:  No prior CT.  Multiple prior chest x-rays  FINDINGS: Contrast opacification is of the pulmonary arteries is excellent. No filling defects within either main pulmonary artery or their branches in either lung to suggest pulmonary embolism. Cardiac silhouette moderately enlarged with biventricular enlargement. Small pericardial effusion. Extensive 3 vessel coronary atherosclerosis. Aortic annular and moderate aortic valvular calcification. Mild atherosclerosis involving the thoracic and upper abdominal aorta and their branches without evidence of aneurysm.  Moderately large right pleural effusion and small left pleural effusion. Passive atelectasis in the right lower lobe. No evidence of interstitial pulmonary edema. No confluent airspace consolidation elsewhere. No pulmonary parenchymal nodules or masses. Central airways patent with mild bronchial wall thickening.  Multiple normal sized lymph nodes throughout the mediastinum and in both hila. No significant lymphadenopathy. Thyroid gland normal in appearance. Mild bilateral gynecomastia.  Moderate ascites in the visualized upper abdomen. Remaining visualized upper abdomen unremarkable. Bone window images demonstrate degenerative changes and DISH throughout the thoracic spine.  Review of the MIP images confirms the above findings.  IMPRESSION: 1. No evidence of pulmonary embolism. 2. Moderately large right pleural effusion with associated passive atelectasis in the right lower lobe. Small left pleural effusion. 3. No evidence of interstitial pulmonary edema. 4. Cardiomegaly with biventricular enlargement. Small pericardial effusion. 5. Extensive  3 vessel coronary atherosclerosis. Moderate aortic valvular calcification. 6. Moderate ascites in the visualized upper abdomen. 7. Mild bilateral gynecomastia.   Electronically Signed   By: Hulan Saas   On: 06/07/2013 11:50      Assessment/Plan  Deconditioning- in setting of ischemic cardiomyopathy with recent colitis and chf exacerbation. Will have him work with therapy team for muscle strengthening. Fall precautions. Skin care  Rash- on his behind, thick eczematous rash  Covering 2/3 of his back, will provide dermatology referral  Hyponatremia- no confusion or seizure activities. Continue sodium bicarbonate tablet bid for now. Check bmp  afib- rate controlled. bp normal. Continue cardizem and metoprolol at current regimen. Continue aspirin for anticoagulation  chf- will monitor his weight and output. Check bmp. Continue aldactone with torsemide and b blocker and CCB. Continue kcl supplement  gerd- continue protonix and monitor his symptoms. Currently controlled  Hyperlipidemia- will continue pravachol for now  Colitis- unclear cause, thoughts about microscopic colitis present and has to follow with Dr Elnoria Howard. Continue loperamide for now   Family/ staff Communication: reviewed care plan with pt and nursing supervisor   Goals of care: return home after completing rehab   Labs/tests ordered: cbc, cmp

## 2013-07-18 ENCOUNTER — Non-Acute Institutional Stay (SKILLED_NURSING_FACILITY): Payer: Medicare Other | Admitting: Internal Medicine

## 2013-07-18 ENCOUNTER — Encounter: Payer: Self-pay | Admitting: Internal Medicine

## 2013-07-18 DIAGNOSIS — E785 Hyperlipidemia, unspecified: Secondary | ICD-10-CM | POA: Insufficient documentation

## 2013-07-18 DIAGNOSIS — R5381 Other malaise: Secondary | ICD-10-CM | POA: Insufficient documentation

## 2013-07-18 DIAGNOSIS — I4891 Unspecified atrial fibrillation: Secondary | ICD-10-CM

## 2013-07-18 DIAGNOSIS — R0981 Nasal congestion: Secondary | ICD-10-CM

## 2013-07-18 DIAGNOSIS — I509 Heart failure, unspecified: Secondary | ICD-10-CM

## 2013-07-18 DIAGNOSIS — I480 Paroxysmal atrial fibrillation: Secondary | ICD-10-CM

## 2013-07-18 DIAGNOSIS — R21 Rash and other nonspecific skin eruption: Secondary | ICD-10-CM | POA: Insufficient documentation

## 2013-07-18 DIAGNOSIS — J3489 Other specified disorders of nose and nasal sinuses: Secondary | ICD-10-CM

## 2013-07-18 DIAGNOSIS — I959 Hypotension, unspecified: Secondary | ICD-10-CM | POA: Insufficient documentation

## 2013-07-18 NOTE — Progress Notes (Signed)
Patient ID: Steve Mccarty, male   DOB: 1945/12/08, 67 y.o.   MRN: [id number]  Steve Mccarty living [employer]  Chief Complaint  Patient presents with  . Hypotension  . Rash  . Nasal Congestion    HPI 67 y/o male patient here for STR is seen today with his bp reading running low recently. His systolic readings are mostly in lower 90s and DBP in 50-60s for last week. His HR are mostly between 70-96. Denies any dizziness or lightheadedness. Has occassional loose stools and is on fluid restriction 1000 cc. Reviewed his recent bmp He has hx of chronic hyponatremia, afib, chf and ischemic cardiomyopathy He also complaints of nasal stuffiness and dryness He has rash on his back associated with itching and is seeing his dermatology next week No other complaints Appetite is fair Has been working with therapy team  Review of Systems  Constitutional: Negative for fever, chills, weight loss, malaise/fatigue and diaphoresis.  HENT: Negative for congestion, hearing loss and sore throat.   Eyes: Negative for blurred vision.  Respiratory: Negative for cough, hemoptysis and shortness of breath.   Cardiovascular: Negative for chest pain, orthopnea and leg swelling.  Gastrointestinal: Negative for heartburn, nausea, vomiting, abdominal pain and blood in stool.  Genitourinary: Negative for dysuria.  Musculoskeletal: Negative for falls.  Skin: Positive for itching and rash.  Neurological: Negative for dizziness, seizures and headaches.  Psychiatric/Behavioral: Negative for depression and memory loss.    Allergies  Allergen Reactions  . Colcrys [Colchicine] Diarrhea   Past Medical History  Diagnosis Date  . Atrial fibrillation   . Hypertension   . Coronary artery disease   . CHF (congestive heart failure)     perpheral edema  . Chronic renal disease, stage III   . Joint pain   . Aortic valve insufficiency, rheumatic   . Diabetes mellitus     Was for 10 years, stopped Metformin 2010  . GERD  (gastroesophageal reflux disease)   . Blood in stool   . Cardiomyopathy, ischemic, EF 25% by echo 11/28/11. 06/03/2012  . Chronic right-sided HF (heart failure)     ascites  . History of nuclear stress test 11/28/2011    mild perfusion defect in mid inferior, basal inferolateral & mid inferolateral region; low-risk  . History of Doppler ultrasound 11/28/2011    L ant tibial - occlusive disease with re-constitution at dorsalis pedis    Medication reviewed. See [facility]  Physical exam  BP 112/72  Pulse 88  Temp(Src) 97.4 F (36.3 C)  Resp 18  SpO2 96%  General- elderly male in no acute distress Head- atraumatic, normocephalic Eyes- PERRLA, EOMI, no pallor, no icterus Neck- no lymphadenopathy, no thyromegaly, no jugular vein distension, no carotid bruit Chest- no chest wall deformities, no chest wall tenderness Cardiovascular- normal s1,s2, no murmurs/ rubs/ gallops Respiratory- bilateral clear to auscultation, no wheeze, no rhonchi, no crackles Abdomen- bowel sounds present, soft, non tender, no organomegaly, no abdominal bruits, no guarding or rigidity, no CVA tenderness Musculoskeletal- able to move all 4 extremities, no spinal and paraspinal tenderness, using wheelchair and walker. Trace edema Neurological- no focal deficit, normal reflexes, normal muscle strength, normal sensation to fine touch and vibration Psychiatry- alert and oriented to person, place and time, normal mood and affect Skin- thickened skin with erythematous rash on his back, has redness in his buttock area around anal area  Labs- 07/18/13 na 125, k 4.3, glu 113, bun 51, cr 1.86, ca 9  Assessment/plan  Hypotension- check orthostatics with his  bun and creatinine elevated and him having loose stools. Reviewed his medication. He has hx of afib and heart rate is running on higher side at times. Will d/c his cardizem and have him only on metoprolol and change it to 12.5 mg bid for now. Continue aldactone.   afib- rate  controlled but has low bp. Thus will d/c cardizem and have his metoprolol changed to 12.5 mg bid and reassess. Continue aspirin  chf- continue aldactone 50 mg daily and torsemide 40 mg daily for now. Weight has been stable at 176 lb for past couple of days. No fluid overload on exam. Continue b blocker. Monitor weight for now as he weighed 173 lb on admission. Fluid restriction  Hyponatremia- continue salt tablet for now. Check bmp 07/22/13  Nasal congestion- will have him on saline nasal spray bid for a week

## 2013-07-25 ENCOUNTER — Encounter: Payer: Self-pay | Admitting: Internal Medicine

## 2013-07-25 ENCOUNTER — Non-Acute Institutional Stay (SKILLED_NURSING_FACILITY): Payer: Medicare Other | Admitting: Internal Medicine

## 2013-07-25 DIAGNOSIS — I50812 Chronic right heart failure: Secondary | ICD-10-CM

## 2013-07-25 DIAGNOSIS — I480 Paroxysmal atrial fibrillation: Secondary | ICD-10-CM

## 2013-07-25 DIAGNOSIS — R5381 Other malaise: Secondary | ICD-10-CM

## 2013-07-25 DIAGNOSIS — R197 Diarrhea, unspecified: Secondary | ICD-10-CM

## 2013-07-25 DIAGNOSIS — E871 Hypo-osmolality and hyponatremia: Secondary | ICD-10-CM

## 2013-07-25 DIAGNOSIS — I4891 Unspecified atrial fibrillation: Secondary | ICD-10-CM

## 2013-07-25 DIAGNOSIS — I959 Hypotension, unspecified: Secondary | ICD-10-CM

## 2013-07-25 DIAGNOSIS — I509 Heart failure, unspecified: Secondary | ICD-10-CM

## 2013-07-25 NOTE — Progress Notes (Signed)
Patient ID: Steve Mccarty, male   DOB: [birth year], 67 y.o.   MRN: [id number]  [name] living [employer]  Chief Complaint  Patient presents with  . Discharge Note    discharge visit   Allergies  Allergen Reactions  . Colcrys [Colchicine] Diarrhea   HPI:   67 y/o male patient is here for STR with deconditioning.  He has hx of ischemic cardiomyopathy, afib, HTN, ckd He was seen in his room today with his nursing supervisor present. The rash in his back is mostly resolved, he refused to see dermatology He denies any complaints this visit Reviewed his vitals in the facility His heart rate has been mostly between 90-130 His bp on review SBP mostly 91-105/ dbp 51-68  Review of Systems:  Constitutional: Negative for fever, chills, weight loss, malaise/fatigue and diaphoresis.   HENT: Negative for congestion, hearing loss and sore throat.    Eyes: Negative for blurred vision. His glasses are broken Respiratory: Negative for cough, hemoptysis and shortness of breath.    Cardiovascular: Negative for chest pain, orthopnea and leg swelling.   Gastrointestinal: Negative for heartburn, nausea, vomiting, abdominal pain and blood in stool. Has diarrhea Genitourinary: Negative for dysuria.   Musculoskeletal: Negative for falls. Improved strength Neurological: Negative for dizziness, seizures and headaches.   Psychiatric/Behavioral: Negative for depression and memory loss.   Past Medical History  Diagnosis Date  . Atrial fibrillation   . Hypertension   . Coronary artery disease   . CHF (congestive heart failure)     perpheral edema  . Chronic renal disease, stage III   . Joint pain   . Aortic valve insufficiency, rheumatic   . Diabetes mellitus     Was for 10 years, stopped Metformin 2010  . GERD (gastroesophageal reflux disease)   . Blood in stool   . Cardiomyopathy, ischemic, EF 25% by echo 11/28/11. 06/03/2012  . Chronic right-sided HF (heart failure)     ascites  . History of  nuclear stress test 11/28/2011    mild perfusion defect in mid inferior, basal inferolateral & mid inferolateral region; low-risk  . History of Doppler ultrasound 11/28/2011    L ant tibial - occlusive disease with re-constitution at dorsalis pedis   Past Surgical History  Procedure Laterality Date  . Tonsillectomy    . Cardiac catheterization  2006    Dr. Aleen Campi  . Esophagogastroduodenoscopy  11/03/2011    Procedure: ESOPHAGOGASTRODUODENOSCOPY (EGD);  Surgeon: Theda Belfast, MD;  Location: [facility];  Service: Endoscopy;  Laterality: N/A;  . Colonoscopy  11/03/2011    Procedure: COLONOSCOPY;  Surgeon: Theda Belfast, MD;  Location: [facility];  Service: Endoscopy;  Laterality: N/A;  . Esophagogastroduodenoscopy  03/22/2012    Procedure: ESOPHAGOGASTRODUODENOSCOPY (EGD);  Surgeon: Theda Belfast, MD;  Location: [facility];  Service: Endoscopy;  Laterality: N/A;  . Esophagogastroduodenoscopy  04/04/2012    Procedure: ESOPHAGOGASTRODUODENOSCOPY (EGD);  Surgeon: Theda Belfast, MD;  Location: [facility];  Service: Endoscopy;  Laterality: N/A;  . Flexible sigmoidoscopy N/A 06/10/2013    Procedure: FLEXIBLE SIGMOIDOSCOPY;  Surgeon: Theda Belfast, MD;  Location: [facility];  Service: Endoscopy;  Laterality: N/A;     BP 105/56  Pulse 120  Temp(Src) 97 F (36.1 C)  Resp 18  Ht 5\' 8"  (1.727 m)  Wt 178 lb (80.74 kg)  BMI 27.07 kg/m2  SpO2 97%  General- elderly male in no acute distress Head- atraumatic, normocephalic Eyes- PERRLA, EOMI, no pallor, no icterus Neck- no lymphadenopathy, no thyromegaly,  no jugular vein distension, no carotid bruit Chest- no chest wall deformities, no chest wall tenderness Cardiovascular- normal s1,s2, no murmurs/ rubs/ gallops, trace bilateral pedal edema Respiratory- bilateral clear to auscultation, no wheeze, no rhonchi, no crackles Abdomen- bowel sounds present, soft, non tender, no organomegaly, no abdominal bruits, no guarding or rigidity, no  CVA tenderness Musculoskeletal- able to move all 4 extremities, no spinal and paraspinal tenderness, using walker. Trace edema and redness in leg s/o chronic venous stasis Neurological- no focal deficit Psychiatry- alert and oriented to person, place and time, normal mood and affect Skin- erythema on his back area but improved from before and thickened skin/ eczema changes resolved  Labs- 07/18/13 na 125, k 4.3, glu 113, bun 51, cr 1.86, ca 9 07/21/13 Na 125, k 4.1, glu 104, bun 45, cr 1.8  Assessment/plan  afib- rate not controlled but also has low bp. Will have him on metoprolol 25 mg bid with aspirin. Will change his aldactone to 25 mg daily and continue torsemide  Hypotension- asymptomatic. Will increase metoprolol to 25 mg bid to help with heart rate and lower aldactone to 25 mg daily to help keep bp stable. Orthostatic has been ruled out. If no improvement in bp readings after med adjustment, should consider workup for adrenal insufficiency  Deconditioning- Can move around with a cane and a walker, weakness present and given his low bp and increased heart rate but insisting on discharge, will have him work with PT ad RN services fro vital monitoring and further home exercises for muscle strengthening and gait stability.  chf- continue aldactone but will change it to 25 mg daily and continue torsemide 40 mg daily for now. Weight has been stable recently. No fluid overload on exam. Continue b blocker. Fluid restriction  Hyponatremia- continue salt tablet for now. Monitor sodium level as outpatient with PCP  Diarrhea- will follow with Dr Elnoria Howard from GI, will make appointment on discharge  Has follow up with PCP on 08/05/13 and will go home with home PT and RN. Script on his medication will be provided. Discussed with patient about postponing his discharge date but patient clearly refuses. His bp and heart rate are to be monitored closely. Explained to the patient the risks of drinking, not  adhering to fluid restriction and med non compliance and pt mentions understanding them. Thus we will hae home RN services arranged until he is seen by his PCP on 08/05/13.

## 2013-07-29 ENCOUNTER — Inpatient Hospital Stay (HOSPITAL_COMMUNITY): Payer: Medicare Other

## 2013-07-29 ENCOUNTER — Inpatient Hospital Stay (HOSPITAL_COMMUNITY)
Admission: EM | Admit: 2013-07-29 | Discharge: 2013-08-11 | DRG: 296 | Disposition: E | Payer: Medicare Other | Attending: Internal Medicine | Admitting: Internal Medicine

## 2013-07-29 ENCOUNTER — Encounter (HOSPITAL_COMMUNITY): Payer: Self-pay | Admitting: Acute Care

## 2013-07-29 ENCOUNTER — Emergency Department (HOSPITAL_COMMUNITY): Payer: Medicare Other

## 2013-07-29 DIAGNOSIS — N183 Chronic kidney disease, stage 3 unspecified: Secondary | ICD-10-CM | POA: Diagnosis present

## 2013-07-29 DIAGNOSIS — E119 Type 2 diabetes mellitus without complications: Secondary | ICD-10-CM | POA: Diagnosis present

## 2013-07-29 DIAGNOSIS — Z66 Do not resuscitate: Secondary | ICD-10-CM | POA: Diagnosis present

## 2013-07-29 DIAGNOSIS — E871 Hypo-osmolality and hyponatremia: Secondary | ICD-10-CM | POA: Diagnosis present

## 2013-07-29 DIAGNOSIS — J9601 Acute respiratory failure with hypoxia: Secondary | ICD-10-CM

## 2013-07-29 DIAGNOSIS — K761 Chronic passive congestion of liver: Secondary | ICD-10-CM | POA: Diagnosis present

## 2013-07-29 DIAGNOSIS — N179 Acute kidney failure, unspecified: Secondary | ICD-10-CM | POA: Diagnosis present

## 2013-07-29 DIAGNOSIS — R569 Unspecified convulsions: Secondary | ICD-10-CM | POA: Diagnosis present

## 2013-07-29 DIAGNOSIS — E872 Acidosis, unspecified: Secondary | ICD-10-CM | POA: Diagnosis present

## 2013-07-29 DIAGNOSIS — I469 Cardiac arrest, cause unspecified: Principal | ICD-10-CM | POA: Diagnosis present

## 2013-07-29 DIAGNOSIS — Z79899 Other long term (current) drug therapy: Secondary | ICD-10-CM

## 2013-07-29 DIAGNOSIS — W19XXXA Unspecified fall, initial encounter: Secondary | ICD-10-CM | POA: Diagnosis present

## 2013-07-29 DIAGNOSIS — I454 Nonspecific intraventricular block: Secondary | ICD-10-CM | POA: Diagnosis present

## 2013-07-29 DIAGNOSIS — D638 Anemia in other chronic diseases classified elsewhere: Secondary | ICD-10-CM | POA: Diagnosis present

## 2013-07-29 DIAGNOSIS — G253 Myoclonus: Secondary | ICD-10-CM | POA: Diagnosis present

## 2013-07-29 DIAGNOSIS — D684 Acquired coagulation factor deficiency: Secondary | ICD-10-CM | POA: Diagnosis present

## 2013-07-29 DIAGNOSIS — I279 Pulmonary heart disease, unspecified: Secondary | ICD-10-CM | POA: Diagnosis present

## 2013-07-29 DIAGNOSIS — A419 Sepsis, unspecified organism: Secondary | ICD-10-CM

## 2013-07-29 DIAGNOSIS — K7460 Unspecified cirrhosis of liver: Secondary | ICD-10-CM | POA: Diagnosis present

## 2013-07-29 DIAGNOSIS — K219 Gastro-esophageal reflux disease without esophagitis: Secondary | ICD-10-CM | POA: Diagnosis present

## 2013-07-29 DIAGNOSIS — I2589 Other forms of chronic ischemic heart disease: Secondary | ICD-10-CM | POA: Diagnosis present

## 2013-07-29 DIAGNOSIS — G931 Anoxic brain damage, not elsewhere classified: Secondary | ICD-10-CM

## 2013-07-29 DIAGNOSIS — I4891 Unspecified atrial fibrillation: Secondary | ICD-10-CM | POA: Diagnosis present

## 2013-07-29 DIAGNOSIS — J9600 Acute respiratory failure, unspecified whether with hypoxia or hypercapnia: Secondary | ICD-10-CM | POA: Diagnosis present

## 2013-07-29 DIAGNOSIS — G9340 Encephalopathy, unspecified: Secondary | ICD-10-CM | POA: Diagnosis present

## 2013-07-29 DIAGNOSIS — I509 Heart failure, unspecified: Secondary | ICD-10-CM | POA: Diagnosis present

## 2013-07-29 DIAGNOSIS — I5022 Chronic systolic (congestive) heart failure: Secondary | ICD-10-CM | POA: Diagnosis present

## 2013-07-29 HISTORY — DX: Cor pulmonale (chronic): I27.81

## 2013-07-29 HISTORY — DX: Chronic kidney disease, stage 3 unspecified: N18.30

## 2013-07-29 HISTORY — DX: Type 2 diabetes mellitus without complications: E11.9

## 2013-07-29 HISTORY — DX: Unspecified systolic (congestive) heart failure: I50.20

## 2013-07-29 HISTORY — DX: Chronic atrial fibrillation, unspecified: I48.20

## 2013-07-29 HISTORY — DX: Personal history of other specified conditions: Z87.898

## 2013-07-29 HISTORY — DX: Nonrheumatic aortic (valve) insufficiency: I35.1

## 2013-07-29 HISTORY — DX: Chronic kidney disease, stage 3 (moderate): N18.3

## 2013-07-29 HISTORY — DX: Ischemic cardiomyopathy: I25.5

## 2013-07-29 HISTORY — DX: Personal history of other diseases of the digestive system: Z87.19

## 2013-07-29 LAB — BASIC METABOLIC PANEL
Chloride: 88 mEq/L — ABNORMAL LOW (ref 96–112)
Creatinine, Ser: 1.99 mg/dL — ABNORMAL HIGH (ref 0.50–1.35)
GFR calc Af Amer: 38 mL/min — ABNORMAL LOW (ref >90)
Potassium: 4.6 mEq/L (ref 3.5–5.1)
Sodium: 119 mEq/L — CL (ref 135–145)

## 2013-07-29 LAB — URINE MICROSCOPIC-ADD ON

## 2013-07-29 LAB — CBC WITH DIFFERENTIAL/PLATELET
Basophils Absolute: 0 10*3/uL (ref 0.0–0.1)
Basophils Relative: 0 % (ref 0–1)
Eosinophils Absolute: 0.1 10*3/uL (ref 0.0–0.7)
Eosinophils Relative: 1 % (ref 0–5)
HCT: 31.4 % — ABNORMAL LOW (ref 39.0–52.0)
Lymphs Abs: 2.8 10*3/uL (ref 0.7–4.0)
MCH: 24.4 pg — ABNORMAL LOW (ref 26.0–34.0)
MCV: 79.7 fL (ref 78.0–100.0)
Monocytes Absolute: 0.5 10*3/uL (ref 0.1–1.0)
Monocytes Relative: 6 % (ref 3–12)
Neutro Abs: 4.3 10*3/uL (ref 1.7–7.7)
RDW: 27.8 % — ABNORMAL HIGH (ref 11.5–15.5)
WBC: 7.7 10*3/uL (ref 4.0–10.5)

## 2013-07-29 LAB — POCT I-STAT, CHEM 8
Calcium, Ion: 1.15 mmol/L (ref 1.13–1.30)
Chloride: 100 mEq/L (ref 96–112)
Creatinine, Ser: 2.4 mg/dL — ABNORMAL HIGH (ref 0.50–1.35)
Glucose, Bld: 213 mg/dL — ABNORMAL HIGH (ref 70–99)
HCT: 41 % (ref 39.0–52.0)
Hemoglobin: 13.9 g/dL (ref 13.0–17.0)
Potassium: 4.1 mEq/L (ref 3.5–5.1)
TCO2: 9 mmol/L (ref 0–100)

## 2013-07-29 LAB — POCT I-STAT 3, ART BLOOD GAS (G3+)
Acid-base deficit: 30 mmol/L — ABNORMAL HIGH (ref 0.0–2.0)
Bicarbonate: 7.2 mEq/L — ABNORMAL LOW (ref 20.0–24.0)
O2 Saturation: 61 %
O2 Saturation: 93 %
TCO2: 9 mmol/L (ref 0–100)
pCO2 arterial: 59.9 mmHg (ref 35.0–45.0)
pCO2 arterial: 29.9 mmHg — ABNORMAL LOW (ref 35.0–45.0)
pH, Arterial: 6.689 — CL (ref 7.350–7.450)
pO2, Arterial: 111 mmHg — ABNORMAL HIGH (ref 80.0–100.0)

## 2013-07-29 LAB — TROPONIN I: Troponin I: <0.3 ng/mL (ref <0.30)

## 2013-07-29 LAB — PRO B NATRIURETIC PEPTIDE: Pro B Natriuretic peptide (BNP): 9486 pg/mL — ABNORMAL HIGH (ref 0–125)

## 2013-07-29 LAB — COMPREHENSIVE METABOLIC PANEL
Alkaline Phosphatase: 118 U/L — ABNORMAL HIGH (ref 39–117)
BUN: 49 mg/dL — ABNORMAL HIGH (ref 6–23)
CO2: 8 mEq/L — CL (ref 19–32)
Chloride: 94 mEq/L — ABNORMAL LOW (ref 96–112)
Creatinine, Ser: 1.91 mg/dL — ABNORMAL HIGH (ref 0.50–1.35)
GFR calc Af Amer: 40 mL/min — ABNORMAL LOW (ref >90)
GFR calc non Af Amer: 35 mL/min — ABNORMAL LOW (ref >90)
Glucose, Bld: 193 mg/dL — ABNORMAL HIGH (ref 70–99)
Potassium: 3.9 mEq/L (ref 3.5–5.1)
Total Bilirubin: 2.3 mg/dL — ABNORMAL HIGH (ref 0.3–1.2)
Total Protein: 4.8 g/dL — ABNORMAL LOW (ref 6.0–8.3)

## 2013-07-29 LAB — GLUCOSE, CAPILLARY: Glucose-Capillary: 193 mg/dL — ABNORMAL HIGH (ref 70–99)

## 2013-07-29 LAB — URINALYSIS, ROUTINE W REFLEX MICROSCOPIC
Ketones, ur: NEGATIVE mg/dL
Nitrite: NEGATIVE
Protein, ur: >300 mg/dL — AB
Specific Gravity, Urine: 1.017 (ref 1.005–1.030)
Urobilinogen, UA: 1 mg/dL (ref 0.0–1.0)

## 2013-07-29 LAB — APTT: aPTT: 74 seconds — ABNORMAL HIGH (ref 24–37)

## 2013-07-29 LAB — AMYLASE: Amylase: 53 U/L (ref 0–105)

## 2013-07-29 LAB — LIPASE, BLOOD: Lipase: 29 U/L (ref 11–59)

## 2013-07-29 LAB — PROTIME-INR
INR: 3.48 — ABNORMAL HIGH (ref 0.00–1.49)
Prothrombin Time: 33.7 seconds — ABNORMAL HIGH (ref 11.6–15.2)

## 2013-07-29 MED ORDER — FENTANYL CITRATE 0.05 MG/ML IJ SOLN
50.0000 ug | INTRAMUSCULAR | Status: DC | PRN
  Administered 2013-07-29: 50 ug via INTRAVENOUS

## 2013-07-29 MED ORDER — CALCIUM CHLORIDE 10 % IV SOLN
INTRAVENOUS | Status: AC | PRN
  Administered 2013-07-29: 1 g via INTRAVENOUS

## 2013-07-29 MED ORDER — SODIUM CHLORIDE 3 % IV SOLN
100.0000 mL/h | Freq: Once | INTRAVENOUS | Status: AC
  Administered 2013-07-29: 100 mL/h via INTRAVENOUS
  Filled 2013-07-29: qty 500

## 2013-07-29 MED ORDER — MUPIROCIN 2 % EX OINT
1.0000 "application " | TOPICAL_OINTMENT | Freq: Two times a day (BID) | CUTANEOUS | Status: DC
  Administered 2013-07-29 – 2013-07-30 (×2): 1 via NASAL
  Filled 2013-07-29: qty 22

## 2013-07-29 MED ORDER — PIPERACILLIN-TAZOBACTAM 3.375 G IVPB 30 MIN
3.3750 g | Freq: Once | INTRAVENOUS | Status: AC
  Administered 2013-07-29: 3.375 g via INTRAVENOUS
  Filled 2013-07-29: qty 50

## 2013-07-29 MED ORDER — SODIUM BICARBONATE 8.4 % IV SOLN
INTRAVENOUS | Status: AC | PRN
  Administered 2013-07-29: 84 mg via INTRAVENOUS

## 2013-07-29 MED ORDER — SODIUM CHLORIDE 0.9 % IV BOLUS (SEPSIS)
500.0000 mL | Freq: Once | INTRAVENOUS | Status: AC
  Administered 2013-07-29: 500 mL via INTRAVENOUS

## 2013-07-29 MED ORDER — VANCOMYCIN HCL IN DEXTROSE 1-5 GM/200ML-% IV SOLN
1000.0000 mg | INTRAVENOUS | Status: DC
  Filled 2013-07-29: qty 200

## 2013-07-29 MED ORDER — CHLORHEXIDINE GLUCONATE CLOTH 2 % EX PADS
6.0000 | MEDICATED_PAD | Freq: Every day | CUTANEOUS | Status: DC
  Administered 2013-07-30: 6 via TOPICAL

## 2013-07-29 MED ORDER — DEXTROSE 5 % IV SOLN
INTRAVENOUS | Status: DC
  Administered 2013-07-29: 21:00:00 via INTRAVENOUS

## 2013-07-29 MED ORDER — SODIUM CHLORIDE 0.9 % IV SOLN
250.0000 mL | INTRAVENOUS | Status: DC | PRN
  Administered 2013-07-29: 18:00:00 via INTRAVENOUS

## 2013-07-29 MED ORDER — NOREPINEPHRINE BITARTRATE 1 MG/ML IJ SOLN
2.0000 ug/min | INTRAVENOUS | Status: DC
  Administered 2013-07-29 (×2): 20 ug/min via INTRAVENOUS
  Administered 2013-07-29 – 2013-07-30 (×2): 12 ug/min via INTRAVENOUS
  Filled 2013-07-29 (×4): qty 4

## 2013-07-29 MED ORDER — SODIUM BICARBONATE 8.4 % IV SOLN
INTRAVENOUS | Status: DC
  Administered 2013-07-29 – 2013-07-30 (×3): via INTRAVENOUS
  Filled 2013-07-29 (×5): qty 1000

## 2013-07-29 MED ORDER — EPINEPHRINE HCL 0.1 MG/ML IJ SOSY
PREFILLED_SYRINGE | INTRAMUSCULAR | Status: AC | PRN
  Administered 2013-07-29: 5 mL via INTRAVENOUS

## 2013-07-29 MED ORDER — BIOTENE DRY MOUTH MT LIQD
15.0000 mL | Freq: Four times a day (QID) | OROMUCOSAL | Status: DC
  Administered 2013-07-30 (×3): 15 mL via OROMUCOSAL

## 2013-07-29 MED ORDER — FENTANYL CITRATE 0.05 MG/ML IJ SOLN
50.0000 ug | INTRAMUSCULAR | Status: DC | PRN
  Filled 2013-07-29: qty 2

## 2013-07-29 MED ORDER — PIPERACILLIN-TAZOBACTAM 3.375 G IVPB
3.3750 g | Freq: Three times a day (TID) | INTRAVENOUS | Status: DC
  Administered 2013-07-29 – 2013-07-30 (×2): 3.375 g via INTRAVENOUS
  Filled 2013-07-29 (×4): qty 50

## 2013-07-29 MED ORDER — SODIUM CHLORIDE 0.9 % IV SOLN
INTRAVENOUS | Status: AC | PRN
  Administered 2013-07-29: 999 mL/h via INTRAVENOUS

## 2013-07-29 MED ORDER — VASOPRESSIN 20 UNIT/ML IJ SOLN
0.0300 [IU]/min | INTRAVENOUS | Status: DC
  Administered 2013-07-29 (×2): 0.03 [IU]/min via INTRAVENOUS
  Filled 2013-07-29 (×2): qty 2.5

## 2013-07-29 MED ORDER — PANTOPRAZOLE SODIUM 40 MG IV SOLR
40.0000 mg | Freq: Every day | INTRAVENOUS | Status: DC
  Administered 2013-07-29: 40 mg via INTRAVENOUS
  Filled 2013-07-29 (×2): qty 40

## 2013-07-29 MED ORDER — CHLORHEXIDINE GLUCONATE 0.12 % MT SOLN
15.0000 mL | Freq: Two times a day (BID) | OROMUCOSAL | Status: DC
  Administered 2013-07-29 – 2013-07-30 (×2): 15 mL via OROMUCOSAL
  Filled 2013-07-29 (×2): qty 15

## 2013-07-29 MED ORDER — VANCOMYCIN HCL IN DEXTROSE 1-5 GM/200ML-% IV SOLN
1000.0000 mg | Freq: Once | INTRAVENOUS | Status: AC
  Administered 2013-07-29: 1000 mg via INTRAVENOUS
  Filled 2013-07-29: qty 200

## 2013-07-29 MED ORDER — HYDROCORTISONE SOD SUCCINATE 100 MG IJ SOLR
50.0000 mg | Freq: Four times a day (QID) | INTRAMUSCULAR | Status: DC
  Administered 2013-07-29 – 2013-07-30 (×3): 50 mg via INTRAVENOUS
  Filled 2013-07-29 (×6): qty 1

## 2013-07-29 NOTE — Procedures (Signed)
Arterial Catheter Insertion Procedure Note Antone Summons [id number] [birth year]  Procedure: Insertion of Arterial Catheter  Indications: Blood pressure monitoring and Frequent blood sampling  Procedure Details Consent: Unable to obtain consent because of emergent medical necessity. Time Out: Verified patient identification, verified procedure, site/side was marked, verified correct patient position, special equipment/implants available, medications/allergies/relevent history reviewed, required imaging and test results available.  Performed Real time [place] used to ID and cannulate the vessel  Maximum sterile technique was used including antiseptics, cap, gloves, gown, hand hygiene, mask and sheet. Skin prep: Chlorhexidine; local anesthetic administered 20 gauge catheter was inserted into right radial artery using the Seldinger technique.  Evaluation Blood flow good; BP tracing good. Complications: No apparent complications.   [name],[name] 08/09/2013  emrgent need Stat Shock, epi  Mcarthur Rossetti. Tyson Alias, MD, FACP Pgr: [phone]  Pulmonary & Critical Care

## 2013-07-29 NOTE — ED Notes (Signed)
Pt to go to CT scan prior to going to ICU.

## 2013-07-29 NOTE — H&P (Signed)
PULMONARY  / CRITICAL CARE MEDICINE  Name: Steve Mccarty MRN: [id number] DOB: 1945-10-19    ADMISSION DATE:  2013-08-01 CONSULTATION DATE:  08/01/13  REFERRING MD :  Rubin Payor EDP PRIMARY SERVICE: PCCM  CHIEF COMPLAINT:  PEA arrest  BRIEF PATIENT DESCRIPTION: 67 y.o. Male discharged from assisted living today presents to [facility] 11/18 via EMS had witnessed arrest at home after fall.  EMS report PEA arrest on arrival with return of ROSC with CPR for  7 minutes.  Patient arrested again 15 minutes post arrest.  Total CPR time approximately 20.    SIGNIFICANT EVENTS / STUDIES:  11/18 discharged from assisted living 11/18 Witnessed PEA arrest with ACLS for 20 min 11/18 CVL and ETT placed 11/18 CT head and cervical spine>>> 11/18: spoke w/ sister, Rae Mar [phone] made full DNR w/ no escalation of care   LINES / TUBES: 11/18 R IJ CVL>> 11/18 R Fem A line >> 11/18 ETT >>  CULTURES: 11/18 Blood x 2 >> 11/18 Urine>> 11/18 Sputum >>  ANTIBIOTICS: vanc 11/18>>> Zosyn 11/18>>>  HISTORY OF PRESENT ILLNESS:  67 y.o. Male with unknown past medical history presents to [facility] 11/18 via EMS after a witnessed arrest at patient's sisters house.  EMS was called.  Patient was in PEA arrest when they arrived and family was performing CPR.  King airway was placed in the field.  CPR was initially given for 7 minutes before ROSC achieved.  Patient had wide complex bundle branch block for 15 minutes before arresting again.  Total CPR time was approximately 20 min.  ETT and CVL placed in the ED.      PAST MEDICAL HISTORY :  Past Medical History  Diagnosis Date  . Ischemic cardiomyopathy EF 25% by echo 11/28/11  . Cor pulmonale, chronic   . H/O ascites passive congestion/right heart failure   . Chronic atrial fibrillation   . CKD (chronic kidney disease), stage III   . Aortic valve insufficiency rheumatic fever   . GERD (gastroesophageal reflux disease)   . Diabetes   . Systolic heart  failure   . Blood in stool   . History of GI diverticular bleed    No past surgical history on file. Prior to Admission medications   aspirin EC 81 MG tablet cholecalciferol (VITAMIN D) 1000 UNITS tablet feeding supplement (ENSURE COMPLETE) LIQD loperamide (IMODIUM) 2 MG capsule metoprolol tartrate (LOPRESSOR) 12.5 mg TABS tablet Multiple Vitamin (MULTIVITAMIN WITH MINERALS) TABS pantoprazole (PROTONIX) 40 MG tablet potassium chloride SA (K-DUR,KLOR-CON) 20 MEQ tablet pravastatin (PRAVACHOL) 40 MG tablet spironolactone (ALDACTONE) 50 MG tablet torsemide (DEMADEX) 20 MG tablet    Allergies not on file  FAMILY HISTORY:  No family history on file. SOCIAL HISTORY:  has no tobacco, alcohol, and drug history on file.  REVIEW OF SYSTEMS:  Unable to perform as patient is on vent  SUBJECTIVE:   VITAL SIGNS: Pulse Rate:  [62-81] 68 (11/18 1615) Resp:  [12-30] 25 (11/18 1615) BP: (73-114)/(44-86) 107/66 mmHg (11/18 1615) SpO2:  [90 %-98 %] 93 % (11/18 1615) Arterial Line BP: (88-124)/(50-62) 124/62 mmHg (11/18 1615) FiO2 (%):  [100 %] 100 % (11/18 1615) HEMODYNAMICS:   VENTILATOR SETTINGS: Vent Mode:  [-] PRVC FiO2 (%):  [100 %] 100 % Set Rate:  [12 bmp] 12 bmp Vt Set:  [500 mL] 500 mL PEEP:  [5 cmH20] 5 cmH20 Plateau Pressure:  [17 cmH20] 17 cmH20 INTAKE / OUTPUT: Intake/Output     11/17 0701 - 11/18 0700 11/18 0701 - 11/19 0700  I.V.  2500   Total Intake   2500   Net   +2500          PHYSICAL EXAMINATION: General:  Acutely ill appearing male, sedated on vent Neuro:  Sedated on vent, per unresponsive 5 mm HEENT:  MM moist and pink Cardiovascular:  On pressors, distant S1S2 no MRG Lungs: Bilateral breath sounds CTA Abdomen:  Protuberant, distended, +BS Musculoskeletal:  No gross deformities, chronic vascular changes in LE, feet cool to touch Skin:  No obvious breakdowns  LABS:  Recent Labs Lab 08-12-13 1547  PHART 6.689*  PCO2ART 59.9*  PO2ART 66.0*   No results  found for this basename: GLUCAP,  in the last 168 hours  CXR: Pending  ASSESSMENT / PLAN:  PULMONARY A: Acute respiratory failure s/p cardiac arrest  Combined met acidosis, resp acidosis P:   - vent bundle in place - repeat abg after higg hMV - Titrate FiO2 for SpO2 > 92% - CXR Increase peep to get to 60% if needed -abg to follow  CARDIOVASCULAR A:  S/p PEA arrest etiology unclear: acute cardiac event/ NSTEMI, pulmonary emboli, sepsis. Favor catastrophic cardiac event   Chronic CHF know EF of 25% Chronic cor pulmonale  H/o CAF (not on anticogaulation d/t GIB) P:  - monitor on tele - Levo and vaso for MAP > 65, wean epi to off - Lactic acid - Troponins x3 - EKG - TTE for rv fxn, as rbb, old? New? - BLE dopplers - D-dimer - BNP No empiric heparin, liver dz? Head ct needed and await coags -cortisol, then consider empiric steroids  RENAL A:   Acute on chronic kidney disease Stage III CKD Profound metabolic acidosis P:   - monitor SCr and UOP - Bicarb gtt for the small NON Ag portion  - Cmet stat - Trend Bmet - Replace electrolytes as indicated - Mg and phos  GASTROINTESTINAL A:   Passive liver congestion secondary to CHF R/o cirhsosis P:   - NPO - Protonix for SUP - follow LFTs -assess amy, lip  HEMATOLOGIC A:   Anemia of chronic disease Coagulapthy, r;o liver dz , sepsis related P:  - SCDs for prophylaxis - PT/INR - CBC w/ diff -coags noted, repeat in am ,for head CT -may need vit k , no active bleeding noted as of now Follow plat, may need fibrinogen  INFECTIOUS A:   Possible sepsis, but currently no obvious source R/o asp  P:   - cx and abx as above - PCT - trend WBCs and fever curve -empiric asp nosocmial abx  ENDOCRINE A:   DM R/o Adrenal insufficiency P:   - monitor CBGs - Cortisol level - Stress dose solucortef  - SSI  NEUROLOGIC A:   Acute encephalopathy secondary to cardiac arrest/ acute hypoxic injury  P:   -  intermittent sedation with Fent and versed - Head and c spine CT - monitor neuro status - consider consult to neuro for future prognostication -fall? Place collar  Terri Piedra [facility] Physician Assistant Student August 12, 2013, 4:26 PM   TODAY'S SUMMARY: 67 y.o. Male with witnessed PEA arrest with total downtime of 20 minutes of ACLS.  Unknown cause of arrest at this point in time.  Differential is broad at this point in time and includes PE, AMI, and various other causes.  Patient is suffering from profound acidosis.  Vent has been changed accordingly.  Will check head CT, echo, BLE dopplers, and labs.  Will admit patient to ICU. Spoke w/  sister Lanetta Inch, she reports his poor chronic health. Is in agreement with DNR. Does not want him to suffer and requests that comfort be our priority. For now we will continue current rx. Will not escalate care. She is aware that if he makes it thru the night we may be considering w/d/    I have personally obtained a history, examined the patient, evaluated laboratory and imaging results, formulated the assessment and plan and placed orders. CRITICAL CARE: The patient is critically ill with multiple organ systems failure and requires high complexity decision making for assessment and support, frequent evaluation and titration of therapies, application of advanced monitoring technologies and extensive interpretation of multiple databases. Critical Care Time devoted to patient care services described in this note is 90 minutes.    Pulmonary and Critical Care Medicine [facility] Pager: [phone]  08/16/2013, 4:21 PM   Mcarthur Rossetti. Tyson Alias, MD, FACP Pgr: [phone] [place] [facility]monary & Critical Care

## 2013-07-29 NOTE — Progress Notes (Signed)
  ANTIBIOTIC CONSULT NOTE - INITIAL  Pharmacy Consult for Zosyn and vancomycin Indication: rule out sepsis  Allergies  Allergen Reactions  . Colchicine-Probenecid     Patient Measurements:   Adjusted Body Weight: estimated to be ~85kg   Vital Signs: Temp: 90.1 F (32.3 C) (11/18 1700) Temp src: Other (Comment) (11/18 1615) BP: 109/60 mmHg (11/18 1715) Pulse Rate: 65 (11/18 1715) Intake/Output from previous day:   Intake/Output from this shift: Total I/O In: 2500 [I.V.:2500] Out: -   Labs:  Recent Labs  08/05/2013 1458  WBC 7.7  HGB 9.6*  PLT 137*  CREATININE 1.91*   CrCl is unknown because there is no height on file for the current visit. No results found for this basename: VANCOTROUGH, VANCOPEAK, VANCORANDOM, GENTTROUGH, GENTPEAK, GENTRANDOM, TOBRATROUGH, TOBRAPEAK, TOBRARND, AMIKACINPEAK, AMIKACINTROU, AMIKACIN,  in the last 72 hours   Microbiology: No results found for this or any previous visit (from the past 720 hour(s)).  Medical History: Past Medical History  Diagnosis Date  . Ischemic cardiomyopathy EF 25% by echo 11/28/11  . Cor pulmonale, chronic   . H/O ascites passive congestion/right heart failure   . Chronic atrial fibrillation   . CKD (chronic kidney disease), stage III   . Aortic valve insufficiency rheumatic fever   . GERD (gastroesophageal reflux disease)   . Diabetes   . Systolic heart failure   . Blood in stool   . History of GI diverticular bleed   . Hypertension     Medications:  Home meds pending Assessment: Patient is s/p PEA arrest.  Zosyn and vancomycin is to start for empiric therapy is possible sepsis.  Estimated creatinine clearance is 40 mL/min  Goal of Therapy:  Vancomycin trough level 15-20 mcg/ml  Plan:  Measure antibiotic drug levels at steady state Follow up culture results Monitor renal function Zosyn 3.375g IV q8h (infuse over 4 hours) Vancomycin 1g IV daily  Mickeal Skinner 07/18/2013,5:21 PM

## 2013-07-29 NOTE — ED Notes (Addendum)
To CT scan with this nurse & RT

## 2013-07-29 NOTE — Code Documentation (Signed)
CBG 117 

## 2013-07-29 NOTE — Progress Notes (Signed)
Na corrected t 127 and seizues imprved  Plan No more 3% salien Change kvo normal saline to d5w No further intervention other than palliative; he is ding otherwise   Dr. Kalman Shan, M.D., [facility].C.P Pulmonary and Critical Care Medicine Staff Physician [facility] [place] Pulmonary and Critical Care Pager: [phone], If no answer or between  15:00h - 7:00h: call [phone]  2013/08/12 8:59 PM

## 2013-07-29 NOTE — ED Provider Notes (Signed)
In the abdomen thereI saw and evaluated the patient, reviewed the resident's note and I agree with the findings and plan.  EKG Interpretation    Date/Time:  Tuesday July 29 2013 14:59:45 EST Ventricular Rate:  99 PR Interval:    QRS Duration: 161 QT Interval:  456 QTC Calculation: 585 R Axis:   151 Text Interpretation:  Atrial fibrillation Right bundle branch block Lateral infarct, age indeterminate Confirmed by Umaima Scholten  MD, Lenford Beddow ([id number]) on 07/25/2013 4:42:28 PM          CRITICAL CARE Performed by: Billee Cashing. Total critical care time: 30 Critical care time was exclusive of separately billable procedures and treating other patients. Critical care was necessary to treat or prevent imminent or life-threatening deterioration. Critical care was time spent personally by me on the following activities: development of treatment plan with patient and/or surrogate as well as nursing, discussions with consultants, evaluation of patient's response to treatment, examination of patient, obtaining history from patient or surrogate, ordering and performing treatments and interventions, ordering and review of laboratory studies, ordering and review of radiographic studies, pulse oximetry and re-evaluation of patient's condition.   Patient came in in cardiac arrest. Had been asystolic upon arrival of EMS. Vitals returned for EMS and lost again. He returned in ED after calcium and bicarbonate. King airway changed over to ET tube by Dr. Craige Cotta under my direct supervision. Admitted to the ICU   Juliet Rude. Rubin Payor, MD 08/07/2013 [phone]

## 2013-07-29 NOTE — ED Notes (Signed)
PCXR complete. Verbal order received per Kreg Shropshire, NP may access central line

## 2013-07-29 NOTE — Code Documentation (Signed)
Pt on Epi drip from EMS-- stopped at 1444-- restarted at 1455

## 2013-07-29 NOTE — Progress Notes (Signed)
Cirrhosis, Coded in SNF, Long time CPR Full NCB Per Dr Tyson Alias: very poor prognosis. Not sal;avageable  RN calling p   - patient seizing. Confirmed on camera   -   Recent Labs Lab 08/24/2013 1458 Aug 24, 2013 1842  NA 125* 119*   Acute worsening of hyponatremia Severe low hyponatermia Symptomatic with severe symptoms of seizures NA Deficit is 1000 meq   Plan of 3% saline as bolus and repeat till immediate rise , then only < in 24h and  Recheck Na immediately after; goal immediately < After that < in 24h   Dr. Kalman Shan, M.D., [facility].C.P Pulmonary and Critical Care Medicine Staff Physician [place] [place] Pulmonary and Critical Care Pager: [phone], If no answer or between  15:00h - 7:00h: call [phone]  24-Aug-2013 8:05 PM

## 2013-07-29 NOTE — Progress Notes (Signed)
CRITICAL VALUE ALERT  Critical value received:  CO2 <7 & NA 119  Date of notification:  08/01/2013  Time of notification:  2042  Critical value read back:yes  Nurse who received alert:  Neville Route, RN  MD notified (1st page):  Ramaswamy  Time of first page:  0845  MD notified (2nd page):  Time of second page:  Responding MD:  Marchelle Gearing  Time MD responded:  [phone]

## 2013-07-29 NOTE — ED Notes (Signed)
Steve Shropshire, NP aware of ABG results.

## 2013-07-29 NOTE — ED Notes (Signed)
Pt placed on zoll, monitor, continuous pulse oximetry and blood pressure cuff

## 2013-07-29 NOTE — Procedures (Signed)
Central Venous Catheter Insertion Procedure Note Steve Mccarty [id number] 1946-08-29  Procedure: Insertion of Central Venous Catheter Indications: Assessment of intravascular volume, Drug and/or fluid administration and Frequent blood sampling  Procedure Details Consent: Unable to obtain consent because of emergent medical necessity. Time Out: Verified patient identification, verified procedure, site/side was marked, verified correct patient position, special equipment/implants available, medications/allergies/relevent history reviewed, required imaging and test results available.  Performed Real time [place] used to ID and cannulate the vessel.  Maximum sterile technique was used including antiseptics, cap, gloves, gown, hand hygiene, mask and sheet. Skin prep: Chlorhexidine; local anesthetic administered A antimicrobial bonded/coated triple lumen catheter was placed in the right internal jugular vein using the Seldinger technique.  Evaluation Blood flow good Complications: No apparent complications Patient did tolerate procedure well. Chest X-ray ordered to verify placement.  CXR: pending.  Steve Mccarty 08/05/2013, 3:45 PM  [place] gudiance Steve Mccarty Alias, MD, FACP Pgr: [phone] [place] [facility]monary & Critical Care

## 2013-07-29 NOTE — ED Provider Notes (Signed)
CSN: [id number]     Arrival date & time 07/13/2013  1445 History   First MD Initiated Contact with Patient 07/26/2013 1456     No chief complaint on file.  (Consider location/radiation/quality/duration/timing/severity/associated sxs/prior Treatment) The history is provided by the EMS personnel. No language interpreter was used.   Patient is a 67 year old Caucasian male who was just discharged from a rehabilitation facility yesterday. He has past medical history liver disease and kidney disease. He was visiting his family this afternoon when he suddenly fell to the ground he lost consciousness. His family did not check her pulse. They contacted EMS. Approximately 5 minutes later when EMS arrived on scene patient was pulseless. Patient regain pulses on scene. EMS brought him to the emergency department. In route he lost pulses again. The patient arrived to the emergency department he was pulseless.  Patient had CPR for a total of 25 minutes.    No past medical history on file. No past surgical history on file. No family history on file. History  Substance Use Topics  . Smoking status: Not on file  . Smokeless tobacco: Not on file  . Alcohol Use: Not on file    Review of Systems  Unable to perform ROS: Patient unresponsive    Allergies  Review of patient's allergies indicates not on file.  Home Medications  No current outpatient prescriptions on file. BP 83/68  Resp 12 Physical Exam  Nursing note and vitals reviewed. Constitutional: He appears well-nourished.  King Airway in place  HENT:  Head: Normocephalic and atraumatic. Head is without raccoon's eyes, without Battle's sign, without abrasion, without contusion and without laceration.  Nose: No sinus tenderness, nasal deformity, septal deviation or nasal septal hematoma.  Mouth/Throat: No lacerations.  Eyes: Right pupil is not reactive. Left pupil is not reactive. Pupils are equal.  Neck: No tracheal deviation present.   Cardiovascular: Regular rhythm.  Tachycardia present.   Pulmonary/Chest: Apnea noted. He has no decreased breath sounds. He has no wheezes. He has no rhonchi. He has no rales.  King Airway in place  Abdominal: Soft. He exhibits distension.  Neurological: He is unresponsive.  No response to pain.     ED Course  INTUBATION Date/Time: 07/19/2013 3:08 PM Performed by: Bethann Berkshire Authorized by: Bethann Berkshire Consent: The procedure was performed in an emergent situation. Indications: airway protection and respiratory failure Intubation method: video-assisted Patient status: unconscious Preoxygenation: BVM Pretreatment medications: none Laryngoscope size: Mac 4 Tube size: 8.0 mm Tube type: cuffed Number of attempts: 1 Ventilation between attempts: BVM Cricoid pressure: no Cords visualized: yes Post-procedure assessment: chest rise and CO2 detector Breath sounds: equal and absent over the epigastrium Cuff inflated: no ETT to lip: 24 cm Tube secured with: ETT holder Patient tolerance: Patient tolerated the procedure well with no immediate complications.   (including critical care time) Labs Review Labs Reviewed  CULTURE, BLOOD (ROUTINE X 2)  CULTURE, BLOOD (ROUTINE X 2)  CBC WITH DIFFERENTIAL  COMPREHENSIVE METABOLIC PANEL  PROTIME-INR  APTT  URINALYSIS, ROUTINE W REFLEX MICROSCOPIC   Imaging Review No results found.  EKG Interpretation   None       MDM  Patient is 67 year old Caucasian male past medical history of kidney disease and liver disease comes emergency department today with cardiac arrest. Arrival to the emergency department patient was pulseless. CPR was continued. Patient was administered an amp of bicarbonate gram of calcium. Regain pulses shortly afterward. Patient had a King airway in place. King airway was exchanged for  an ET tube as detailed above. Patient is on epinephrine drip and he arrived in emergency department. And had been begun by EMS.  Epinephrine drip was exchanged for Levophed drip. Critical care was consulted and the patient arrived in emergency department. Patient was transferred to the ICU in stable condition. Care was discussed my attending Dr. Rubin Payor.    1. Cardiac arrest     Bethann Berkshire, MD 08/06/2013 1520

## 2013-07-29 NOTE — ED Notes (Signed)
PCCM, Kreg Shropshire, NP at bedside to place centra line & arterial line. Aware of low BP &titrating levophed gtt

## 2013-07-29 NOTE — Progress Notes (Signed)
Per staff  patient was discharged from nursing home and drove himself to his 68 yrs old sister's house.  Upon arriving patient collapsed and was brought to ED as CPR. I called 84 yrs sister who said there was no one else to come to hospital. Patient was never married nor have any children. Patient is presently intubated and critical. Will pass on to fellow Chaplain to continue support.  Provided support to staff.  07/25/2013 1500  Clinical Encounter Type  Visited With Patient not available;Health care provider  Visit Type Critical Care;ED;Patient actively dying  Referral From Nurse  Spiritual Encounters  Spiritual Needs Emotional;Other (Comment) (staff support)  Venida Jarvis, [address]

## 2013-07-29 NOTE — ED Notes (Signed)
To ED via [facility] from sister's house with CPR in progress-- on thumper, weak pulse felt in femoral artery. Has had Epi x 4, Epi gtt started enroute-- has 18 G IV in left AC.

## 2013-07-29 NOTE — ED Notes (Signed)
Aspen collar placed per order

## 2013-07-30 DIAGNOSIS — J9600 Acute respiratory failure, unspecified whether with hypoxia or hypercapnia: Secondary | ICD-10-CM

## 2013-07-30 DIAGNOSIS — G931 Anoxic brain damage, not elsewhere classified: Secondary | ICD-10-CM

## 2013-07-30 DIAGNOSIS — I469 Cardiac arrest, cause unspecified: Principal | ICD-10-CM

## 2013-07-30 DIAGNOSIS — A419 Sepsis, unspecified organism: Secondary | ICD-10-CM

## 2013-07-30 LAB — BLOOD GAS, ARTERIAL
Acid-base deficit: 18.4 mmol/L — ABNORMAL HIGH (ref 0.0–2.0)
MECHVT: 500 mL
PEEP: 8 cmH2O
Patient temperature: 98.6
TCO2: 9.8 mmol/L (ref 0–100)
pCO2 arterial: 27.7 mmHg — ABNORMAL LOW (ref 35.0–45.0)
pH, Arterial: 7.135 — CL (ref 7.350–7.450)
pO2, Arterial: 130 mmHg — ABNORMAL HIGH (ref 80.0–100.0)

## 2013-07-30 LAB — URINE CULTURE
Colony Count: NO GROWTH
Culture: NO GROWTH

## 2013-07-30 LAB — BASIC METABOLIC PANEL
CO2: 8 mEq/L — CL (ref 19–32)
Calcium: 7.9 mg/dL — ABNORMAL LOW (ref 8.4–10.5)
Glucose, Bld: 218 mg/dL — ABNORMAL HIGH (ref 70–99)
Sodium: 119 mEq/L — CL (ref 135–145)

## 2013-07-30 LAB — CBC
HCT: 35.5 % — ABNORMAL LOW (ref 39.0–52.0)
Hemoglobin: 11.4 g/dL — ABNORMAL LOW (ref 13.0–17.0)
MCV: 76.3 fL — ABNORMAL LOW (ref 78.0–100.0)
RBC: 4.65 MIL/uL (ref 4.22–5.81)
RDW: 27.2 % — ABNORMAL HIGH (ref 11.5–15.5)
WBC: 15.3 10*3/uL — ABNORMAL HIGH (ref 4.0–10.5)

## 2013-07-30 LAB — PHOSPHORUS: Phosphorus: 7.2 mg/dL — ABNORMAL HIGH (ref 2.3–4.6)

## 2013-07-30 LAB — MAGNESIUM: Magnesium: 2.1 mg/dL (ref 1.5–2.5)

## 2013-07-30 LAB — TROPONIN I: Troponin I: 1.6 ng/mL (ref <0.30)

## 2013-07-30 MED ORDER — MORPHINE BOLUS VIA INFUSION
5.0000 mg | INTRAVENOUS | Status: DC | PRN
  Filled 2013-07-30: qty 20

## 2013-07-30 MED ORDER — MORPHINE SULFATE 25 MG/ML IV SOLN
10.0000 mg/h | INTRAVENOUS | Status: DC
  Filled 2013-07-30: qty 10

## 2013-07-30 MED ORDER — SODIUM CHLORIDE 0.9 % IV SOLN
10.0000 mg/h | INTRAVENOUS | Status: DC
  Administered 2013-07-30: 10 mg/h via INTRAVENOUS
  Filled 2013-07-30: qty 10

## 2013-07-30 MED FILL — Medication: Qty: 1 | Status: AC

## 2013-07-31 ENCOUNTER — Encounter: Payer: Self-pay | Admitting: Internal Medicine

## 2013-08-04 LAB — CULTURE, BLOOD (ROUTINE X 2)

## 2013-08-10 NOTE — Discharge Summary (Signed)
NAME:  STEN, DEMATTEO NO.:  [id number]  MEDICAL RECORD NO.:  [id number]  LOCATION:  2H08C                        FACILITY:  [facility]  PHYSICIAN:  Felipa Evener, MD  DATE OF BIRTH:  [birth year]  DATE OF ADMISSION:  07/20/2013 DATE OF DISCHARGE:  07/29/2013                              DISCHARGE SUMMARY   DEATH SUMMARY:  PRIMARY DIAGNOSIS/CAUSE OF DEATH:  Pulseless electrical activity, cardiac arrest.  SECONDARY DIAGNOSES: 1. Anoxic brain injury. 2. Respiratory failure. 3. Acute on chronic renal failure. 4. Liver congestion. 5. Coagulopathy due to liver disease. 6. Sepsis. 7. Diabetes. 8. Anoxic encephalopathy.  The patient is a 67 year old male who was recently discharged from rehab facility, went to his sister's house where he fell and became unresponsive.  EMS was called.  Patient was noted to be in PEA.  ACLS protocol was followed.  __________.  The patient was brought to [facility].  The decision was made not to cool the patient at that point, due to significant contraindication including a prolonged downtime.  The following day, the patient displayed clear evidence of anoxic brain injury.  I met with the sister, had an extended discussion, in which she informed me that the patient would never want to be maintained alive on machines, at which point, decision was made to make the patient a full DNR and start morphine and to extubate to comfort. Morphine was started, the patient was extubated and did expire shortly thereafter with the family at bedside.     Felipa Evener, MD     WJY/MEDQ  D:  08/09/2013  T:  08/09/2013  Job:  [id number]

## 2013-08-11 NOTE — Progress Notes (Signed)
PULMONARY  / CRITICAL CARE MEDICINE  Name: Steve Mccarty MRN: [id number] DOB: 04/01/1946    ADMISSION DATE:  07/23/2013 CONSULTATION DATE:  08/01/2013  REFERRING MD :  Rubin Payor EDP PRIMARY SERVICE: PCCM  CHIEF COMPLAINT:  PEA arrest  BRIEF PATIENT DESCRIPTION: 67 y.o. Male discharged from assisted living today presents to [facility] 11/18 via EMS had witnessed arrest at home after fall.  EMS report PEA arrest on arrival with return of ROSC with CPR for  7 minutes.  Patient arrested again 15 minutes post arrest.  Total CPR time approximately 20.    SIGNIFICANT EVENTS / STUDIES:  11/18 discharged from assisted living 11/18 Witnessed PEA arrest with ACLS for 20 min 11/18 CVL and ETT placed 11/18 CT head and cervical spine>>> evidence of anoxic brain injury 11/18: spoke w/ sister, Rae Mar [phone]) made full DNR w/ no escalation of care   LINES / TUBES: 11/18 R IJ CVL>> 11/18 R Fem A line >> 11/18 ETT >>  CULTURES: 11/18 Blood x 2 >> 11/18 Urine>> 11/18 Sputum >>  ANTIBIOTICS: vanc 11/18>>> Zosyn 11/18>>>  SUBJECTIVE:  Pt. Having myoclonus overnight.  Sister Cristela Blue coming in today.  VITAL SIGNS: Temp:  [89.4 F (31.9 C)-97 F (36.1 C)] 97 F (36.1 C) (11/19 0930) Pulse Rate:  [54-84] 84 (11/19 0930) Resp:  [12-38] 22 (11/19 0930) BP: (73-114)/(44-86) 114/54 mmHg (11/19 0859) SpO2:  [77 %-100 %] 99 % (11/19 0930) Arterial Line BP: (88-131)/(48-66) 113/55 mmHg (11/19 0930) FiO2 (%):  [10 %-100 %] 100 % (11/19 0859) Weight:  [188 lb 7.9 oz (85.5 kg)-188 lb 15 oz (85.7 kg)] 188 lb 15 oz (85.7 kg) (11/19 0500) HEMODYNAMICS:   VENTILATOR SETTINGS: Vent Mode:  [-] PRVC FiO2 (%):  [10 %-100 %] 100 % Set Rate:  [12 bmp-30 bmp] 30 bmp Vt Set:  [500 mL] 500 mL PEEP:  [5 cmH20-8 cmH20] 8 cmH20 Plateau Pressure:  [12 cmH20-48 cmH20] 48 cmH20 INTAKE / OUTPUT: Intake/Output     11/18 0701 - 11/19 0700 11/19 0701 - 11/20 0700   I.V. (mL/kg) 5750.9 (67.1) 198.3 (2.3)    IV Piggyback 300    Total Intake(mL/kg) 6050.9 (70.6) 198.3 (2.3)   Urine (mL/kg/hr) 55    Other 250    Total Output 305     Net +5745.9 +198.3         PHYSICAL EXAMINATION: General:  Acutely ill appearing male, sedated on vent Neuro:  Pupils unresponsive, left eye with upward gaze, no gag HEENT:  MM moist and pink, icteric sclera Cardiovascular:  On pressors, distant S1S2 no MRG Lungs: Bilateral breath sounds scattered rhonchi Abdomen:  Protuberant, distended, +BS Musculoskeletal:  No gross deformities, chronic vascular changes in LE, feet cool to touch Skin:  No obvious breakdowns  LABS:  Recent Labs Lab 07/13/2013 1458 08/03/2013 1547 07/27/2013 1600 07/14/2013 1734 07/19/2013 1842 08/06/2013 2056 07/29/13 2330 07/22/2013 0005 07/18/2013 0445  HGB 9.6*  --   --   --   --  13.9  --   --  11.4*  WBC 7.7  --   --   --   --   --   --   --  15.3*  PLT 137*  --   --   --   --   --   --   --  136*  NA 125*  --   --   --  119* 127* 119*  --   --   K 3.9  --   --   --  4.6 4.1 4.2  --   --   CL 94*  --   --   --  88* 100 87*  --   --   CO2 8*  --   --   --  <7*  --  8*  --   --   GLUCOSE 193*  --   --   --  240* 213* 218*  --   --   BUN 49*  --   --   --  51* 55* 53*  --   --   CREATININE 1.91*  --   --   --  1.99* 2.40* 2.20*  --   --   CALCIUM 8.5  --   --   --  8.0*  --  7.9*  --   --   MG  --   --   --   --   --   --   --   --  2.1  PHOS  --   --   --   --   --   --   --   --  7.2*  AST 43*  --   --   --   --   --   --   --   --   ALT 18  --   --   --   --   --   --   --   --   ALKPHOS 118*  --   --   --   --   --   --   --   --   BILITOT 2.3*  --   --   --   --   --   --   --   --   PROT 4.8*  --   --   --   --   --   --   --   --   ALBUMIN 2.0*  --   --   --   --   --   --   --   --   APTT 74*  --   --   --   --   --   --   --   --   INR 3.48*  --   --   --   --   --   --   --   --   LATICACIDVEN  --   --   --   --  11.1*  --   --   --   --   TROPONINI  --   --  <0.30  --   --    --   --   --  1.60*  PROCALCITON  --   --   --   --  0.14  --   --   --   --   PROBNP  --   --  9486.0*  --   --   --   --   --   --   PHART  --  6.689*  --  6.875*  --   --   --  7.135*  --   PCO2ART  --  59.9*  --  29.9*  --   --   --  27.7*  --   PO2ART  --  66.0*  --  111.0*  --   --   --  130.0*  --     Recent Labs Lab 07/16/2013 2009  GLUCAP 193*    CXR:  Cardiomegaly, low lung volumes, some patchy appearing infiltrates.    ASSESSMENT / PLAN:  PULMONARY A: Acute respiratory failure s/p cardiac arrest  Combined met acidosis, resp acidosis P:   - Start comfort measures and terminally extubate today.  CARDIOVASCULAR A:  S/p PEA arrest etiology unclear: acute cardiac event/ NSTEMI, pulmonary emboli, sepsis. Favor catastrophic cardiac event   Chronic CHF know EF of 25% Chronic cor pulmonale  H/o CAF (not on anticogaulation d/t GIB) P:  - D/C further tests. - D/C levophed.  RENAL A:   Acute on chronic kidney disease Stage III CKD Profound metabolic acidosis P:   - D/C further blood draws.  GASTROINTESTINAL A:   Passive liver congestion secondary to CHF R/o cirhsosis P:   - Maintain NPO and d/c OGT.  HEMATOLOGIC A:   Anemia of chronic disease Coagulapthy, r/o liver dz , sepsis related P:  - D/C further blood draws and transfusions.  INFECTIOUS A:   Possible sepsis, but currently no obvious source R/o asp  P:   - D/C abx.  ENDOCRINE A:   DM No Adrenal insufficiency P:   - D/C CBGs and SSI.  NEUROLOGIC A:   Acute encephalopathy secondary to cardiac arrest/ acute hypoxic injury  Anoxic brain injury P:   - Morphine for comfort pre extubation.  Terri Piedra [employer] Physician Assistant Student 07/19/2013, 9:38 AM   TODAY'S SUMMARY: 67 y.o. Male s/p arrest.  Head CT shows evidence of anoxic brain injury.  Neuro exam correlates with head CT with myoclonus noted last night.  D-dimer largely elevated, troponins elevated, lactate levels  elevated.  BLE dopplers and echo not performed last night will d/c.  PE and catastrophic cardiac event still high on differential.  Patient has no chance of any functional recovery, met with sister, will proceed with withdrawal of care today.  CC time 35 min.  Patient seen and examined, agree with above note.  I dictated the care and orders written for this patient under my direction.  Alyson Reedy, MD [phone]

## 2013-08-11 NOTE — Care Management Note (Signed)
    Page 1 of 1   07/29/2013     8:22:58 AM   CARE MANAGEMENT NOTE 08/09/2013  Patient:  JONTAE, ADEBAYO   Account Number:  [id number]  Date Initiated:  07/23/2013  Documentation initiated by:  Junius Creamer  Subjective/Objective Assessment:   adm w arrest, vent     Action/Plan:   lives alone, pcp dr Shary Decamp   Anticipated DC Date:     Anticipated DC Plan:           Choice offered to / List presented to:             Status of service:   Medicare Important Message given?   (If response is "NO", the following Medicare IM given date fields will be blank) Date Medicare IM given:   Date Additional Medicare IM given:    Discharge Disposition:    Per UR Regulation:  Reviewed for med. necessity/level of care/duration of stay  If discussed at Long Length of Stay Meetings, dates discussed:    Comments:

## 2013-08-11 NOTE — Procedures (Signed)
Extubation Procedure Note  Patient Details:   Name: Steve Mccarty DOB: [birth year] MRN: [id number]   Airway Documentation:  Airway 8 mm (Active)  Secured at (cm) 24 cm 07/18/2013  8:59 AM  Measured From Lips 07/15/2013  8:59 AM  Secured Location Left 08/07/2013  8:59 AM  Secured By [employer] 07/24/2013  8:59 AM  Tube Holder Repositioned Yes 07/18/2013  8:59 AM  Cuff Pressure (cm H2O) 25 cm H2O 07/22/2013  7:24 PM  Site Condition Dry 08/07/2013  3:49 AM    Evaluation  O2 sats: stable throughout Complications: No apparent complications Patient did tolerate procedure well. Bilateral Breath Sounds: Rhonchi Suctioning: Oral;Airway No - terminal wean, pt not responsive   Devra Dopp D 07/19/2013, 12:51 PM

## 2013-08-11 NOTE — Progress Notes (Signed)
CRITICAL VALUE ALERT  Critical value received:  NA 119 & CO2 8  Date of notification:  07/13/2013  Time of notification:  0000  Critical value read back:yes  Nurse who received alert:  Neville Route, RN  MD notified (1st page):  Craige Cotta  Time of first page:  0001  MD notified (2nd page):  Time of second page:  Responding MD:  Craige Cotta  Time MD responded:  0001

## 2013-08-11 NOTE — Progress Notes (Signed)
Absent breath sounds, heart tones, and pupillary response. Family at bedside. MD notified. Verified by two RN's.

## 2013-08-11 NOTE — Clinical Social Work Psychosocial (Signed)
Clinical Social Work Department BRIEF PSYCHOSOCIAL ASSESSMENT 08/04/2013  Patient:  Steve Mccarty, Steve Mccarty     Account Number:  [id number]     Admit date:  07/17/2013  Clinical Social Worker:  Varney Biles  Date/Time:  07/17/2013 12:55 PM  Referred by:  Physician  Date Referred:  07/29/2013 Referred for  Other - See comment   Other Referral:   "comfort care"   Interview type:  Other - See comment Other interview type:   CSW received consult for comfort care--CSW consulted RNCM, who informed CSW that pt is being extubated. RNCM informed CSW that her support is not necessary, but that pt's sister requested information about cremation/affordable burial.    PSYCHOSOCIAL DATA Living Status:   Admitted from facility:   Level of care:   Primary support name:  Rae Mar [phone]) Primary support relationship to patient:  SIBLING Degree of support available:   Good--pt's sister at hospital for extubation.    CURRENT CONCERNS Current Concerns  Other - See comment   Other Concerns:   Pt being extubated today.    SOCIAL WORK ASSESSMENT / PLAN CSW received consult: "comfort care"--CSW consulted RNCM, who informed CSW that pt is being removed from life support. RNCM informed CSW that CSW assessment/support is not necessary, but that pt's sister requested information about cremation/affordable burial resources. CSW brought a list of resources to pt's room and informed pt's RN, who will ensure pt's sister gets list.   Assessment/plan status:  No Further Intervention Required Other assessment/ plan:   Information/referral to community resources:   List for funeral homes & cremation assistance provided to pt's sister.    PATIENT'S/FAMILY'S RESPONSE TO PLAN OF CARE: N/A.       Maryclare Labrador, MSW, [facility] Clinical Social Worker [phone]

## 2013-08-11 NOTE — Progress Notes (Signed)
CRITICAL VALUE ALERT  Critical value received:  Troponin 1.60  Date of notification:  07/24/2013  Time of notification:  0545  Critical value read back:yes  Nurse who received alert:  Neville Route, RN  Expected lab d/t pt having CPR and defibrillation yesterday.

## 2013-08-11 NOTE — Significant Event (Signed)
ABG    Component Value Date/Time   PHART 7.135* 08/06/2013 0005   PCO2ART 27.7* 08/10/2013 0005   PO2ART 130.0* 08/07/2013 0005   HCO3 8.9* 08/07/2013 0005   TCO2 9.8 07/29/2013 0005   ACIDBASEDEF 18.4* 08/01/2013 0005   O2SAT 97.9 08/03/2013 0005    BMET    Component Value Date/Time   NA 119* 07/20/2013 2330   K 4.2 08/04/2013 2330   CL 87* 08/03/2013 2330   CO2 8* 07/15/2013 2330   GLUCOSE 218* 07/25/2013 2330   BUN 53* 07/15/2013 2330   CREATININE 2.20* 07/19/2013 2330   CALCIUM 7.9* 07/12/2013 2330   GFRNONAA 29* 07/13/2013 2330   GFRAA 34* 07/12/2013 2330    Pt s/p cardiac arrest with CT head showing evidence for anoxic injury.  Has persistent acidosis, hypotension, renal failure, and hyponatremia.  Pt is DNR.  Additional interventions at this time would be medically futile.  He is not likely to survive this illness.  Coralyn Helling, MD [facility] Pulmonary/Critical Care 08/03/2013, 12:38 AM Pager:  [phone] After 3pm call: [phone]

## 2013-08-11 NOTE — Progress Notes (Signed)
35 mls Morphine gtt wasted in sink. Verified by two RN's.

## 2013-08-11 NOTE — Progress Notes (Signed)
Nutrition Brief Note  Chart reviewed. Pt now transitioning to comfort care and planning for withdrawal of care today.  No further nutrition interventions warranted at this time.  Please re-consult as needed.   Loyce Dys, MS RD LDN Clinical Inpatient Dietitian Pager: [phone] Weekend/After hours pager: [phone]

## 2013-08-11 DEATH — deceased

## 2013-11-09 DEATH — deceased

## 2014-09-20 IMAGING — CR DG ABD PORTABLE 1V
1 series · 1 of 1 positions shown · non-contrast
Comparison: 06/07/2013

CLINICAL DATA: Nausea/ diarrhea.

EXAM:
PORTABLE ABDOMEN - 1 VIEW

[AP]
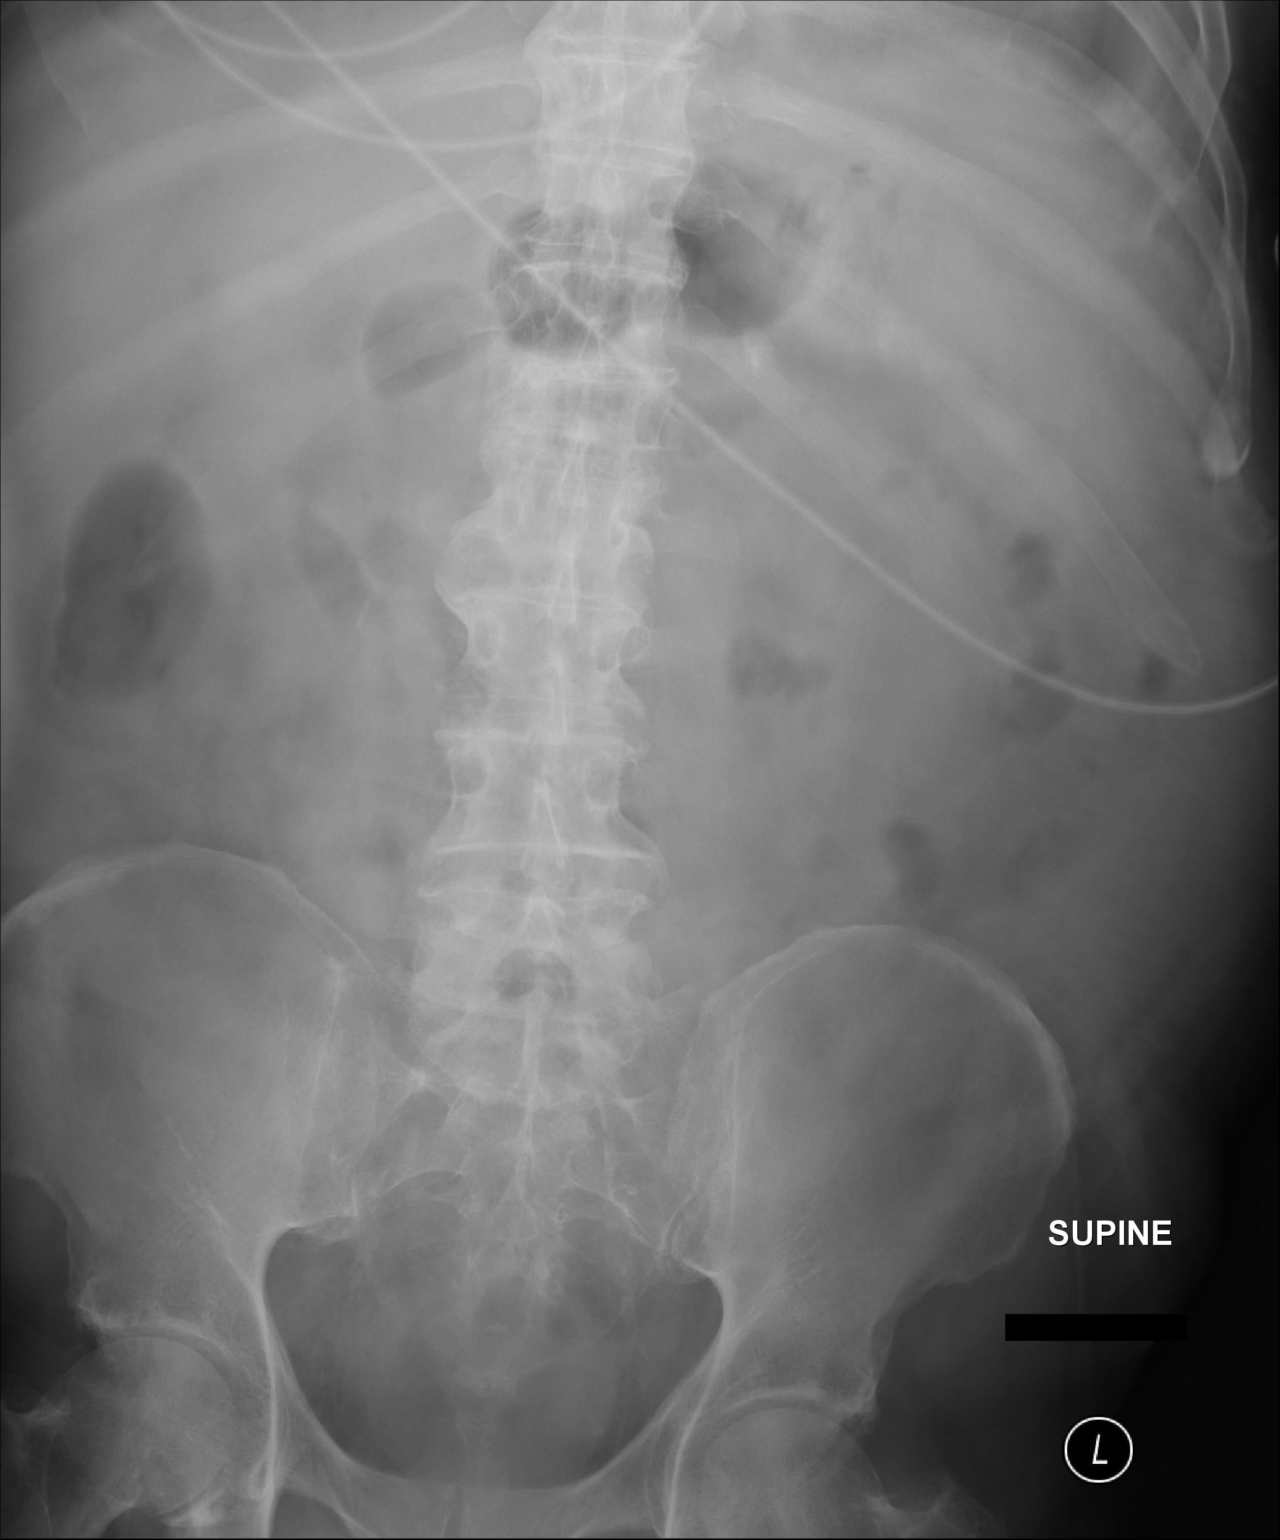

[1 of 1 positions shown; findings below may reference images not displayed]

FINDINGS: Bowel gas pattern is nonobstructive. There is no focal mass or mass
effect. Remainder of the exam to include a compression fracture of
L1 is unchanged.
IMPRESSION: Nonspecific, nonobstructive bowel gas pattern.

## 2014-11-04 IMAGING — CT CT HEAD W/O CM
4 of 6 series · 17 of 47 positions shown, 19 images · non-contrast
Comparison: None.

CLINICAL DATA: Fall. Cardiopulmonary resuscitation for 25 minutes
after being found pulseless.

EXAM:
CT HEAD WITHOUT CONTRAST
CT CERVICAL SPINE WITHOUT CONTRAST
TECHNIQUE: Multidetector CT imaging of the head and cervical spine was
performed following the standard protocol without intravenous
contrast. Multiplanar CT image reconstructions of the cervical spine
were also generated.

[Series 5: soft tissue · axial · 0.29mm/px · z∈[+59,+225]mm · 8 of 107 slices shown, 10 images]
[im 12/107  brain]
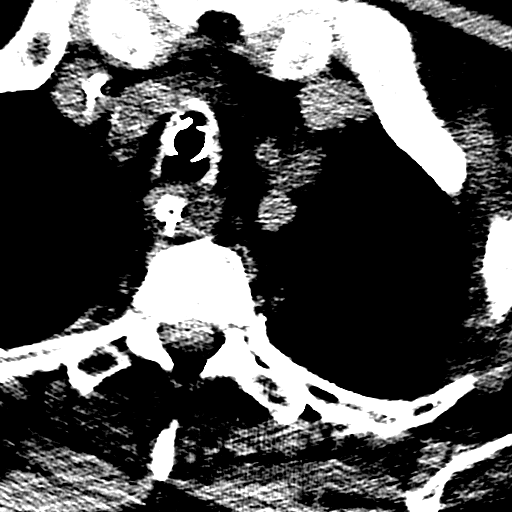
[im 12/107  bone]
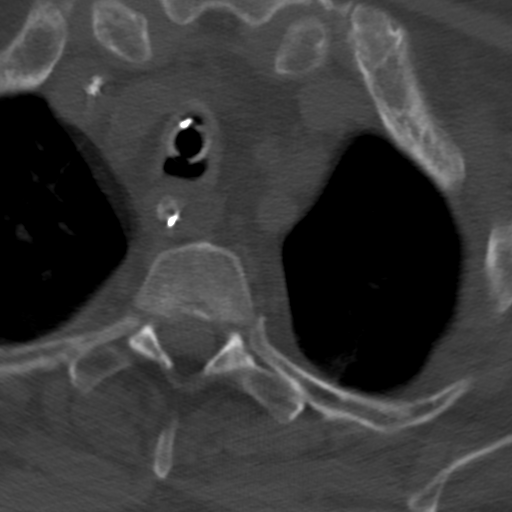
[im 24/107  brain]
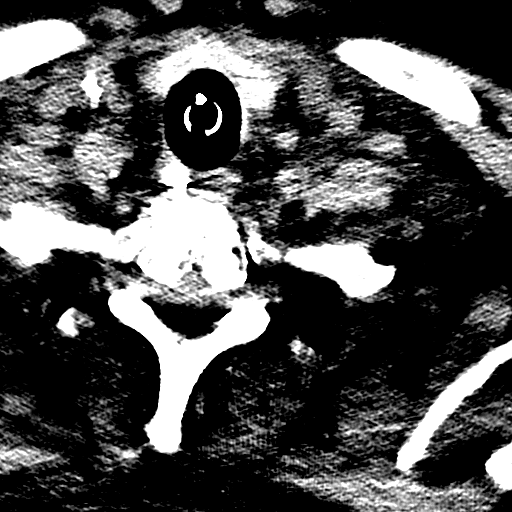
[im 36/107  brain]
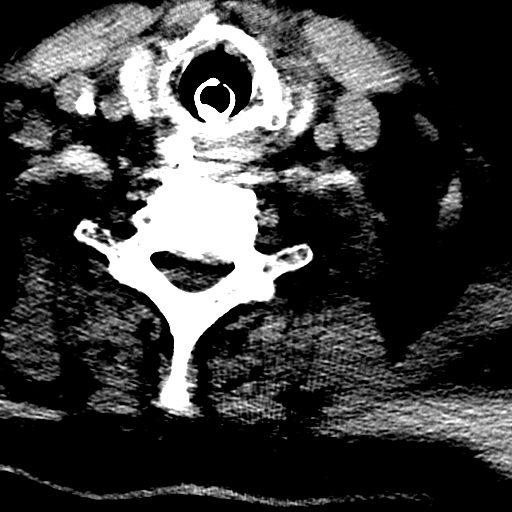
[im 48/107  brain]
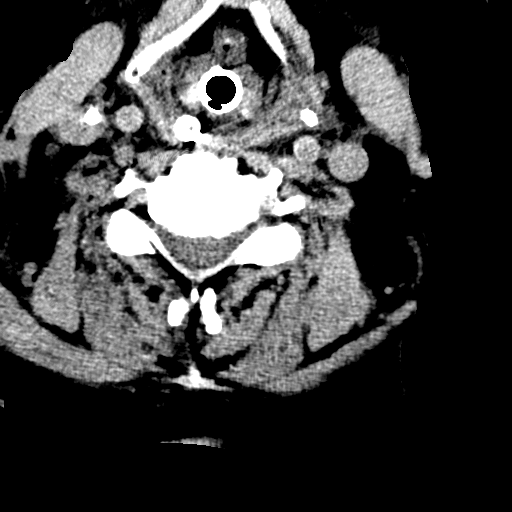
[im 59/107  brain]
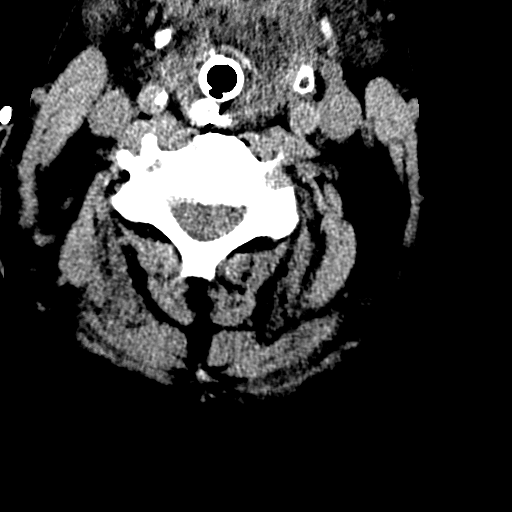
[im 59/107  bone]
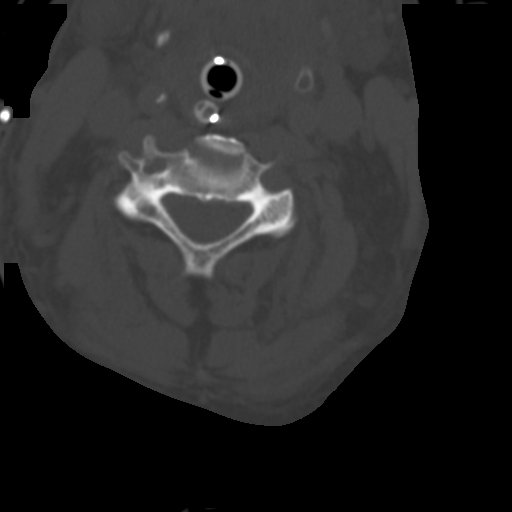
[im 71/107  brain]
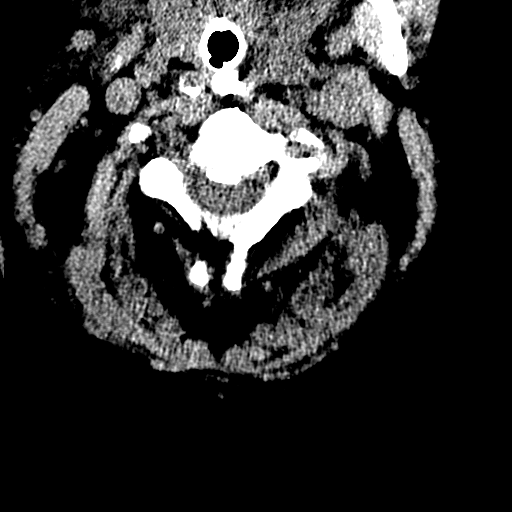
[im 83/107  brain]
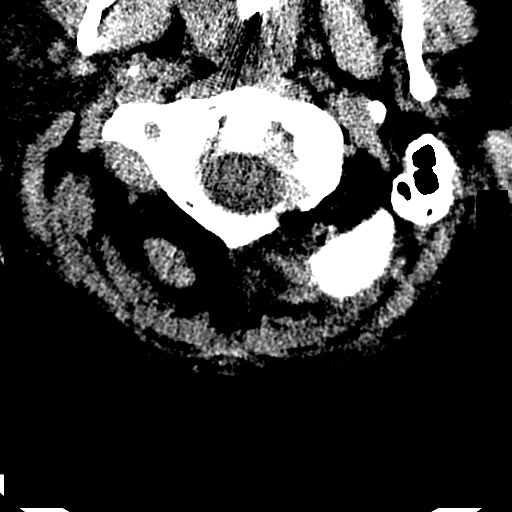
[im 95/107  brain]
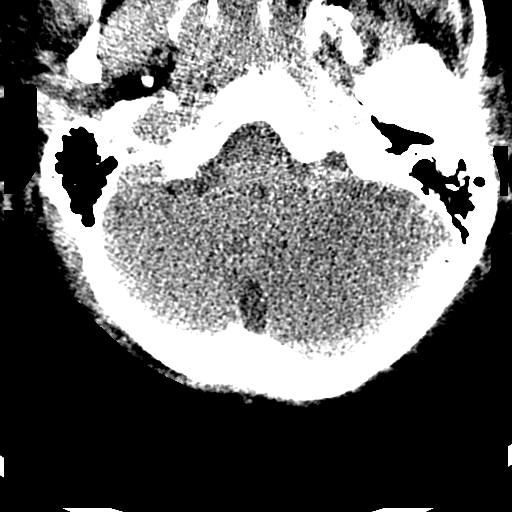

[orthog · axial · 0.29mm/px · z∈[+39,+82]mm · 3 of 104 slices shown]
[im 12/104  brain]
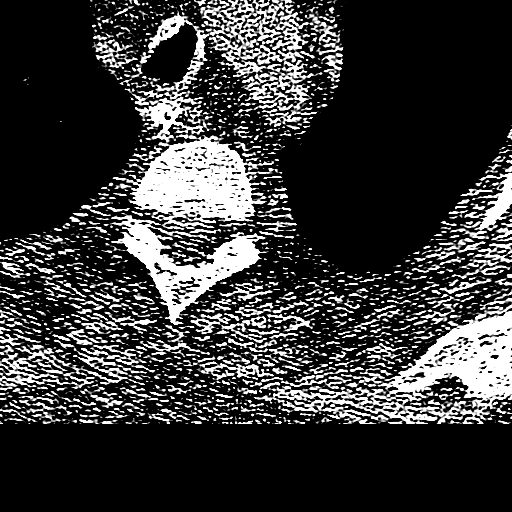
[im 23/104  brain]
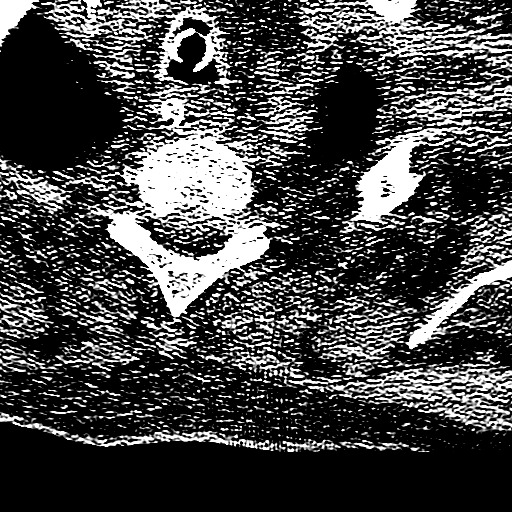
[im 35/104  brain]
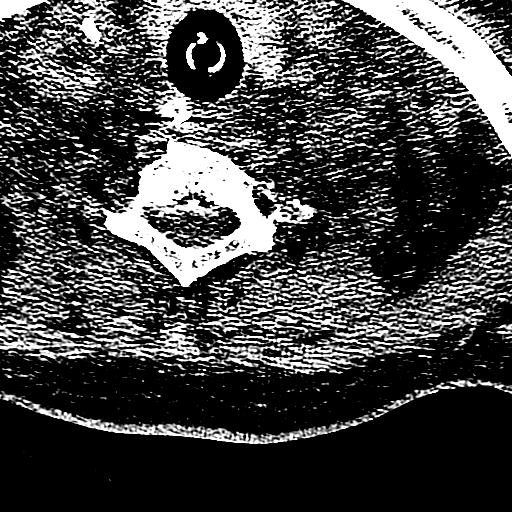

[cor · coronal · 0.41mm/px · 3 of 47 slices shown]
[im 16/47  brain]
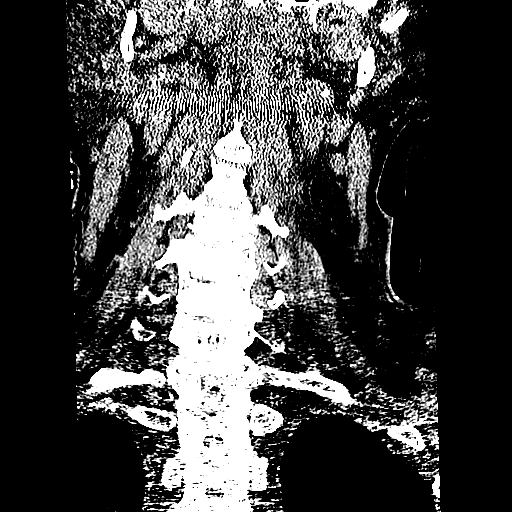
[im 21/47  brain]
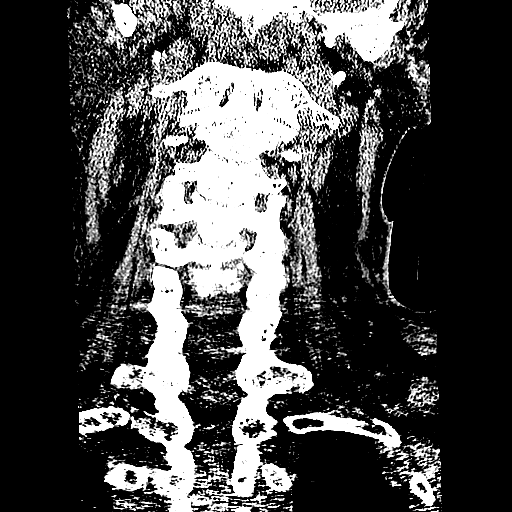
[im 26/47  brain]
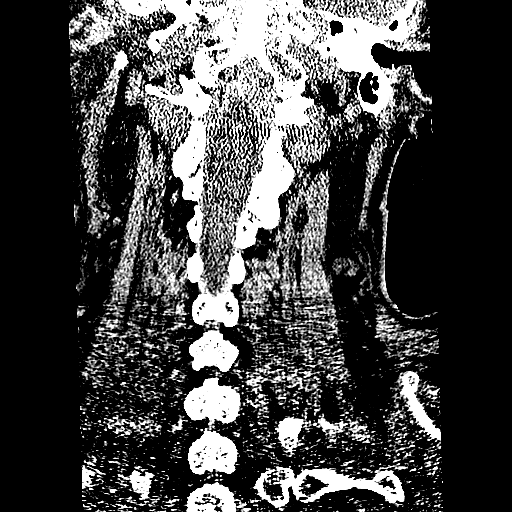

[sag · sagittal · 0.41mm/px · 3 of 51 slices shown]
[im 17/51  brain]
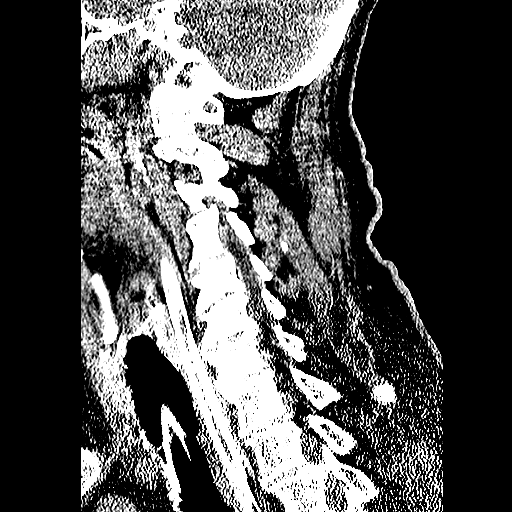
[im 26/51  brain]
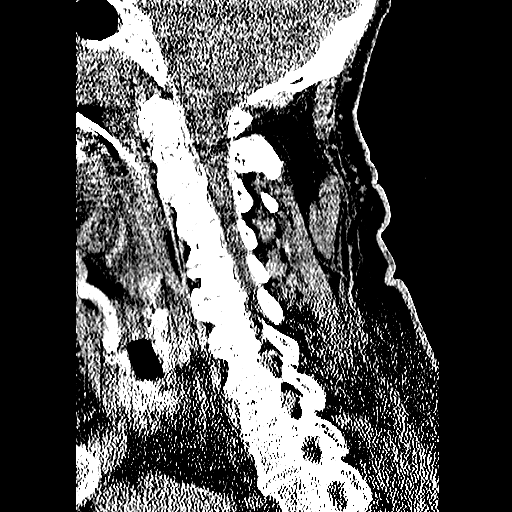
[im 34/51  brain]
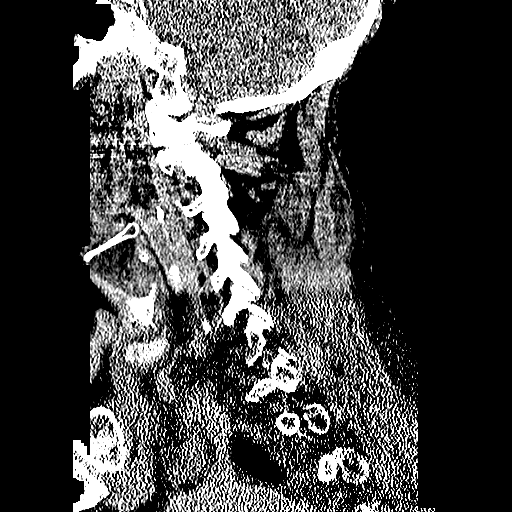

[17 of 47 positions shown; findings below may reference images not displayed]

FINDINGS: CT HEAD FINDINGS

Poor gray-white differentiation in the cerebrum and cerebellum is
concerning for global anoxic event. Sulci are still visible. No
intracranial hemorrhage or mass lesion. The patient is orally
intubated. Fluid in the left maxillary sinus and in multiple ethmoid
air cells along with evidence of chronic paranasal sinusitis.

CT CERVICAL SPINE FINDINGS

Cervical spondylosis and degenerative disk disease noted with loss
of intervertebral disc height at C5-6 and C6-7, and uncinate and
facet spurring causing osseous foraminal narrowing on the left at
C5-6 and C6-7, and on the right at C3-4 and C6-7. No fracture or
subluxation is observed. Prominence of posterior nasopharyngeal soft
tissues noted. There is evidence of ossification of posterior
longitudinal ligament at C2-3 and posterior osseous ridging at C5-6,
C6-7, and C7-T1.
IMPRESSION: 1. Diffuse loss of gray-white differentiation in the cerebrum and
cerebellum. In the context of the patient's prolonged
cardiopulmonary resuscitation and being found pulseless, appearance
is highly suspicious for global anoxic event. No intracranial
hemorrhage or mass lesion currently observed.
2. Cervical spondylosis and degenerative disc disease, without
fracture or acute subluxation.
3. Paranasal sinus opacification may be from intubation or
sinusitis.

## 2015-04-29 LAB — CULTURE, BLOOD (ROUTINE X 2)
